# Patient Record
Sex: Female | Born: 1960
Health system: Southern US, Community
[De-identification: ages and names within clinical notes are randomized; demographics above are authoritative.]

## PROBLEM LIST (undated history)

## (undated) DIAGNOSIS — K449 Diaphragmatic hernia without obstruction or gangrene: Secondary | ICD-10-CM

## (undated) DIAGNOSIS — R112 Nausea with vomiting, unspecified: Secondary | ICD-10-CM

## (undated) DIAGNOSIS — D3610 Benign neoplasm of peripheral nerves and autonomic nervous system, unspecified: Secondary | ICD-10-CM

## (undated) DIAGNOSIS — M1990 Unspecified osteoarthritis, unspecified site: Secondary | ICD-10-CM

## (undated) DIAGNOSIS — I493 Ventricular premature depolarization: Secondary | ICD-10-CM

## (undated) DIAGNOSIS — E039 Hypothyroidism, unspecified: Secondary | ICD-10-CM

## (undated) DIAGNOSIS — Z9889 Other specified postprocedural states: Secondary | ICD-10-CM

## (undated) DIAGNOSIS — K219 Gastro-esophageal reflux disease without esophagitis: Secondary | ICD-10-CM

## (undated) DIAGNOSIS — F32A Depression, unspecified: Secondary | ICD-10-CM

## (undated) DIAGNOSIS — E7800 Pure hypercholesterolemia, unspecified: Secondary | ICD-10-CM

## (undated) DIAGNOSIS — R079 Chest pain, unspecified: Secondary | ICD-10-CM

## (undated) DIAGNOSIS — Z8639 Personal history of other endocrine, nutritional and metabolic disease: Secondary | ICD-10-CM

## (undated) DIAGNOSIS — R002 Palpitations: Secondary | ICD-10-CM

## (undated) DIAGNOSIS — K589 Irritable bowel syndrome without diarrhea: Secondary | ICD-10-CM

## (undated) DIAGNOSIS — J189 Pneumonia, unspecified organism: Secondary | ICD-10-CM

## (undated) DIAGNOSIS — R51 Headache: Secondary | ICD-10-CM

## (undated) DIAGNOSIS — F329 Major depressive disorder, single episode, unspecified: Secondary | ICD-10-CM

## (undated) HISTORY — DX: Palpitations: R00.2

## (undated) HISTORY — DX: Morbid (severe) obesity due to excess calories: E66.01

## (undated) HISTORY — DX: Irritable bowel syndrome, unspecified: K58.9

## (undated) HISTORY — PX: BIOPSY THYROID: PRO38

## (undated) HISTORY — DX: Gastro-esophageal reflux disease without esophagitis: K21.9

## (undated) HISTORY — DX: Ventricular premature depolarization: I49.3

## (undated) HISTORY — PX: ABDOMINAL HYSTERECTOMY: SHX81

## (undated) HISTORY — DX: Chest pain, unspecified: R07.9

## (undated) HISTORY — PX: CHOLECYSTECTOMY: SHX55

## (undated) HISTORY — DX: Diaphragmatic hernia without obstruction or gangrene: K44.9

## (undated) HISTORY — PX: SPINE SURGERY: SHX786

## (undated) HISTORY — DX: Unspecified osteoarthritis, unspecified site: M19.90

---

## 1999-09-19 ENCOUNTER — Other Ambulatory Visit: Admission: RE | Admit: 1999-09-19 | Discharge: 1999-09-19 | Payer: Self-pay | Admitting: Obstetrics and Gynecology

## 2000-01-29 ENCOUNTER — Inpatient Hospital Stay (HOSPITAL_COMMUNITY): Admission: RE | Admit: 2000-01-29 | Discharge: 2000-02-02 | Payer: Self-pay | Admitting: Obstetrics and Gynecology

## 2000-01-29 ENCOUNTER — Encounter (INDEPENDENT_AMBULATORY_CARE_PROVIDER_SITE_OTHER): Payer: Self-pay | Admitting: Specialist

## 2000-02-11 ENCOUNTER — Inpatient Hospital Stay (HOSPITAL_COMMUNITY): Admission: AD | Admit: 2000-02-11 | Discharge: 2000-02-11 | Payer: Self-pay | Admitting: Obstetrics and Gynecology

## 2002-01-29 ENCOUNTER — Encounter: Admission: RE | Admit: 2002-01-29 | Discharge: 2002-01-29 | Payer: Self-pay | Admitting: Obstetrics and Gynecology

## 2002-01-29 ENCOUNTER — Encounter: Payer: Self-pay | Admitting: Obstetrics and Gynecology

## 2002-01-31 ENCOUNTER — Ambulatory Visit (HOSPITAL_COMMUNITY): Admission: RE | Admit: 2002-01-31 | Discharge: 2002-01-31 | Payer: Self-pay | Admitting: Internal Medicine

## 2002-01-31 ENCOUNTER — Encounter: Payer: Self-pay | Admitting: Internal Medicine

## 2002-11-22 ENCOUNTER — Encounter: Payer: Self-pay | Admitting: Obstetrics and Gynecology

## 2002-11-22 ENCOUNTER — Encounter: Admission: RE | Admit: 2002-11-22 | Discharge: 2002-11-22 | Payer: Self-pay | Admitting: Obstetrics and Gynecology

## 2002-12-22 ENCOUNTER — Other Ambulatory Visit: Admission: RE | Admit: 2002-12-22 | Discharge: 2002-12-22 | Payer: Self-pay | Admitting: Obstetrics and Gynecology

## 2004-05-03 ENCOUNTER — Encounter: Admission: RE | Admit: 2004-05-03 | Discharge: 2004-05-03 | Payer: Self-pay | Admitting: Obstetrics and Gynecology

## 2004-12-28 ENCOUNTER — Ambulatory Visit: Payer: Self-pay | Admitting: Orthopedic Surgery

## 2005-04-10 ENCOUNTER — Ambulatory Visit: Payer: Self-pay | Admitting: Orthopedic Surgery

## 2005-04-16 ENCOUNTER — Encounter: Payer: Self-pay | Admitting: Orthopedic Surgery

## 2005-12-12 ENCOUNTER — Encounter: Admission: RE | Admit: 2005-12-12 | Discharge: 2005-12-12 | Payer: Self-pay | Admitting: Obstetrics and Gynecology

## 2006-03-03 ENCOUNTER — Encounter: Payer: Self-pay | Admitting: Internal Medicine

## 2006-08-04 ENCOUNTER — Ambulatory Visit: Payer: Self-pay | Admitting: Internal Medicine

## 2006-08-04 ENCOUNTER — Encounter: Payer: Self-pay | Admitting: Internal Medicine

## 2006-08-11 ENCOUNTER — Ambulatory Visit: Payer: Self-pay | Admitting: Internal Medicine

## 2007-01-09 DIAGNOSIS — D649 Anemia, unspecified: Secondary | ICD-10-CM | POA: Insufficient documentation

## 2007-01-09 DIAGNOSIS — Z860100 Personal history of colon polyps, unspecified: Secondary | ICD-10-CM | POA: Insufficient documentation

## 2007-01-09 DIAGNOSIS — F329 Major depressive disorder, single episode, unspecified: Secondary | ICD-10-CM

## 2007-01-09 DIAGNOSIS — E119 Type 2 diabetes mellitus without complications: Secondary | ICD-10-CM

## 2007-01-16 ENCOUNTER — Telehealth: Payer: Self-pay | Admitting: Internal Medicine

## 2007-01-23 ENCOUNTER — Telehealth: Payer: Self-pay | Admitting: Internal Medicine

## 2007-01-30 ENCOUNTER — Ambulatory Visit: Payer: Self-pay | Admitting: Internal Medicine

## 2007-01-30 DIAGNOSIS — F17200 Nicotine dependence, unspecified, uncomplicated: Secondary | ICD-10-CM | POA: Insufficient documentation

## 2007-01-30 DIAGNOSIS — E669 Obesity, unspecified: Secondary | ICD-10-CM

## 2007-01-30 DIAGNOSIS — R7309 Other abnormal glucose: Secondary | ICD-10-CM

## 2007-01-30 DIAGNOSIS — G479 Sleep disorder, unspecified: Secondary | ICD-10-CM | POA: Insufficient documentation

## 2007-01-30 DIAGNOSIS — E782 Mixed hyperlipidemia: Secondary | ICD-10-CM

## 2007-01-30 DIAGNOSIS — E0500 Thyrotoxicosis with diffuse goiter without thyrotoxic crisis or storm: Secondary | ICD-10-CM | POA: Insufficient documentation

## 2007-02-11 ENCOUNTER — Telehealth (INDEPENDENT_AMBULATORY_CARE_PROVIDER_SITE_OTHER): Payer: Self-pay | Admitting: *Deleted

## 2007-02-23 ENCOUNTER — Telehealth: Payer: Self-pay | Admitting: Internal Medicine

## 2007-04-20 ENCOUNTER — Ambulatory Visit: Payer: Self-pay | Admitting: Internal Medicine

## 2007-06-26 ENCOUNTER — Ambulatory Visit: Payer: Self-pay | Admitting: Internal Medicine

## 2008-02-09 ENCOUNTER — Ambulatory Visit: Payer: Self-pay | Admitting: Internal Medicine

## 2008-10-05 ENCOUNTER — Ambulatory Visit: Payer: Self-pay | Admitting: Cardiovascular Disease

## 2008-12-27 ENCOUNTER — Ambulatory Visit: Payer: Self-pay | Admitting: Unknown Physician Specialty

## 2009-02-10 ENCOUNTER — Ambulatory Visit: Payer: Self-pay

## 2009-02-10 ENCOUNTER — Other Ambulatory Visit: Payer: Self-pay

## 2009-03-13 ENCOUNTER — Ambulatory Visit: Payer: Self-pay | Admitting: Orthopedic Surgery

## 2009-05-02 ENCOUNTER — Ambulatory Visit: Payer: Self-pay | Admitting: Unknown Physician Specialty

## 2009-08-09 ENCOUNTER — Ambulatory Visit: Payer: Self-pay | Admitting: Orthopedic Surgery

## 2009-08-30 ENCOUNTER — Other Ambulatory Visit: Payer: Self-pay

## 2009-12-19 ENCOUNTER — Encounter: Payer: Self-pay | Admitting: Internal Medicine

## 2009-12-19 ENCOUNTER — Inpatient Hospital Stay (HOSPITAL_COMMUNITY): Admission: RE | Admit: 2009-12-19 | Discharge: 2009-12-22 | Payer: Self-pay | Admitting: Neurosurgery

## 2010-02-14 ENCOUNTER — Other Ambulatory Visit: Payer: Self-pay

## 2010-04-18 ENCOUNTER — Ambulatory Visit: Payer: Self-pay

## 2010-05-29 NOTE — Op Note (Signed)
Summary: Lumbar Fusion/Dr. Maeola Harman  Lumbar Fusion/Dr. Maeola Harman   Imported By: Maryln Gottron 12/28/2009 14:45:44  _____________________________________________________________________  External Attachment:    Type:   Image     Comment:   External Document

## 2010-07-13 LAB — URINE CULTURE
Colony Count: NO GROWTH
Culture  Setup Time: 201108231918
Culture: NO GROWTH

## 2010-07-13 LAB — CBC
MCH: 29.5 pg (ref 26.0–34.0)
Platelets: 244 10*3/uL (ref 150–400)
RBC: 4.88 MIL/uL (ref 3.87–5.11)
RDW: 13.1 % (ref 11.5–15.5)
WBC: 8.5 10*3/uL (ref 4.0–10.5)

## 2010-07-13 LAB — URINALYSIS, MICROSCOPIC ONLY
Glucose, UA: NEGATIVE mg/dL
Hgb urine dipstick: NEGATIVE
Ketones, ur: NEGATIVE mg/dL
Leukocytes, UA: NEGATIVE
Nitrite: NEGATIVE
Protein, ur: NEGATIVE mg/dL

## 2010-07-13 LAB — BASIC METABOLIC PANEL
Creatinine, Ser: 0.82 mg/dL (ref 0.4–1.2)
GFR calc Af Amer: >60 mL/min (ref >60)

## 2010-07-13 LAB — SURGICAL PCR SCREEN
MRSA, PCR: NEGATIVE
Staphylococcus aureus: NEGATIVE

## 2010-07-13 LAB — TYPE AND SCREEN

## 2010-09-14 NOTE — H&P (Signed)
[facility]  Patient:    Tasha, Pearson                           MRN: [id number] Attending:  Rande Brunt. Eda Paschal, M.D.                         History and Physical  This was dictated on the day of her admission and it could not be found by medical records and so therefore, it is being redictated.  CHIEF COMPLAINT:  Dysmenorrhea and menorrhagia.  HISTORY OF PRESENT ILLNESS:  Patient is a 50 year old gravida 2, para 2, who has had persistent and progressive dysmenorrhea, menorrhagia and pain on her right side during her periods and also two weeks after her periods.  They have become more extreme over the past year or so.  She was living in [place].  She underwent a diagnostic laparoscopy and ultrasound there which confirmed fibroids; she also underwent a D&C for the menorrhagia without any improvement in the above.  In addition, she notices between her periods a mass in her right lower quadrant that gets bigger and then gets smaller again and it was not seen at the time of laparoscopy but clearly, it also gives her a lot of pain and discomfort and clearly could be an area of endometriosis.  She has had two previous cesarean sections, one in January of 1996 and one in November of 1997.  She is a smoker and therefore is not a candidate for oral contraceptives.  As a result of all of the above, she enters the hospital for a total abdominal hysterectomy and exploration of the lower quadrant because of this intermittent pain.  We have decided to conserve her ovaries unless there is severe disease, because of her age; she therefore will not undergo bilateral salpingo-oophorectomy.  PAST MEDICAL HISTORY:  Patient has some depression and is currently taking Zoloft 50 mg daily.  She also has a history of Graves disease and is status post treatment and currently not on any medicine for this.  PREVIOUS SURGERY:  Arthroscopy in 1990.  Cholecystectomy in 1996.   C-sections in 1996 and 1997.  Laparoscopy and D&C in 2000.  FAMILY HISTORY:  Father has had an MI, age 29.  Mother died of pancreatic cancer.  She has a great-aunt with colon cancer.  She also has a family history of diabetes.  SOCIAL HISTORY:  She is a smoker, smoking one pack per week.  She does not use alcohol.  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  PHYSICAL EXAMINATION  GENERAL:  Patient is a well-developed, well-nourished female in no acute distress.  VITAL SIGNS:  Blood pressure is 112/74, pulse is 80 and regular, respirations are 16 and nonlabored.  She is afebrile.  HEENT:  All within normal limits.  NECK:  Supple.  Trachea in the midline.  Thyroid not enlarged.  LUNGS:  Clear to P&A.  HEART:  No thrills, heaves or murmurs.  BREASTS:  No masses.  ABDOMEN:  Soft, without guarding, rebound or masses.  PELVIC:  External and vaginal are within normal limits.  Cervix is clean.  Pap smear shows no atypia.  Uterus is enlarged by myomas to 10-week-size.  Adnexa are palpably normal.  RECTAL:  Negative.  EXTREMITIES:  Within normal limits.  ADMISSION IMPRESSION:  Severe dysmenorrhea, persistent pelvic pain, intermittent right lower quadrant mass; endometriosis and/or adenomyosis suspected along with  fibroids.  PLAN:  Exploratory laparotomy with total abdominal hysterectomy and any other appropriate surgery. DD:  02/19/00 TD:  02/19/00 Job: [id number]

## 2010-09-14 NOTE — Op Note (Signed)
[facility]  Patient:    Tasha Pearson, Tasha Pearson                    MRN: [id number] Proc. Date: 03/04/00 Adm. Date:  [id number] Disc. Date: [id number] Attending:  Sharon Mt                           Operative Report  NO DICTATION. DD:  03/04/00 TD:  03/04/00 Job: [id number]

## 2010-09-14 NOTE — Discharge Summary (Signed)
[facility]  Patient:    Tasha Pearson, Tasha Pearson                    MRN: [id number] Adm. Date:  [id number] Disc. Date: [id number] Attending:  Sharon Mt                           Discharge Summary  HISTORY OF PRESENT ILLNESS:  The patient is a 50 year old female who was admitted to the hospital with a history of severe dysmenorrhea, menorrhagia, endometriosis suspected, and a lump in her right lower quadrant which got bit each month on a cyclical basis.  She entered the hospital for definitive surgery.  HOSPITAL COURSE:  On the day of admission she was taken to the operating room, and a total abdominal hysterectomy was performed.  Also found at the same time was a subcutaneous nodule which was dissected free and which came back endometriosis on frozen section.  Postoperatively, she seemed to be doing well except she started to develop nausea and inability to tolerate an oral diet. She was continued on IV fluids and restricted diet.  She also ran a fever up to 101 which was observed.  Finally, by the fourth postoperative day she was no longer nauseated.  She was tolerating an oral diet well and was ready for discharge.  DISCHARGE MEDICATIONS:  Mepergan Fortis 1 or 2 every four hours as needed for pain.  ACTIVITY:  Ambulatory.  WOUND CARE:  Routine.  Her staples had already been removed.  FOLLOW-UP:  She was to return to the office in three weeks for further follow-up.  CONDITION ON DISCHARGE:  Improved.  PATHOLOGY:  Final pathology report cannot be found in the chart at the moment.  DISCHARGE DIAGNOSES: 1. Dysmenorrhea. 2. Fibroids. 3. Endometriosis with subcutaneous endometriosis.  OPERATIONS:  Total abdominal hysterectomy and excision of subcutaneous endometriosis. DD:  02/18/00 TD:  02/19/00 Job: [id number]

## 2010-09-14 NOTE — Op Note (Signed)
[facility]  Patient:    Tasha Pearson, Tasha Pearson                    MRN: [id number] Proc. Date: 03/04/00 Adm. Date:  [id number] Disc. Date: [id number] Attending:  Sharon Mt                           Operative Report  REDICTATION  This was dictated on January 29, 2000, but was lost by medical records, and therefore is being redictated.  It is dictated as accurately as possible, but it has been 6-7 weeks since the surgery.  PREOPERATIVE DIAGNOSIS:  Severe dysmenorrhea, persistent pelvic pain, intermittent right lower quadrant mass, endometriosis, and adenomyosis suspected along with leiomyomata uteri.  POSTOPERATIVE DIAGNOSIS:  Dysmenorrhea, menorrhagia, pelvic pain, subcutaneous endometriosis, endometriosis of the serosa of the uterus, leiomyomata uteri.  OPERATION:  Total abdominal hysterectomy, excision of endometriosis, excision of endometriotic nodule in subcutaneous tissue.  SURGEON:  Daniel L. Eda Paschal, M.D.  FINDINGS:  At the time of laparotomy in the subcutaneous tissue above the fascia, a mass was found.  Frozen section was obtained which showed endometriosis on frozen section.  The uterus was enlarged by multiple myoma to approximately 12 weeks size.  There was also some serosal endometriosis present.  Ovaries and fallopian tubes were free of any disease.  DESCRIPTION OF PROCEDURE:  After adequate general endotracheal anesthesia, the patient was placed in the supine position, prepped and draped in the usual sterile manner.  A Foley catheter was inserted into the patients bladder.  A Pfannenstiel incision was made.  Dissection was carried down through the subcutaneous tissue, and on the right side, where the patient had been complaining of intermittent right lower quadrant pain, a mass was found.  It was sharply dissected free from the surrounding subcutaneous tissue.  The entire mass was removed without leaving any of it behind.   Frozen section was obtained and it was endometriosis.  The fascia was then opened transversely. The peritoneum was entered vertically.  Subcutaneous bleeders were clamped and Bovied as encountered.  When the peritoneal cavity was open, multiple leiomyomata uteri were found as well as some serosal endometriosis.  However, ovaries and fallopian tubes, and pelvic peritoneum were otherwise free of disease.  The round ligaments were clamped, cut, and suture ligated with #1 chromic catgut.  The vesicouterine fold and the peritoneum were sharply dissected free.  The utero-ovarian, round ligaments, and fallopian tubes were clamped, cut, and doubly suture ligated with #1 chromic catgut, leaving the ovaries and tubes in place.  The posterior peritoneum was sharply dissected free, pushing the ureters away from the uterus.  The uterine arteries were clamped, cut, and doubly suture ligated with #1 chromic catgut.  The parametrium was taken down in successive bites by clamping, cutting, and suture ligating with #1 chromic catgut.  The cervicovaginal junction was identified and entered with sharp dissection, and the uterus was sent to pathology for tissue diagnosis along with the subcutaneous nodule which had already gone and was identified as endometriosis.  Copious irrigation was done with Ringers lactate.  The vaginal cuff was sutured with #1 chromic catgut incorporating uterosacrals and parametrium for good vault support.  The cuff was closed with interrupted 0 Vicryl.  Two sponge, needle, and instrument counts were correct.  The peritoneum was closed with a running 0 Vicryl.  Subfascial and ________ spaces were copiously irrigated with Ringers lactate. The fascia was closed  with two running 0 Vicryl and the skin was closed with staples.  Estimated blood loss for the entire procedure was 100 cc with none replaced.  The patient tolerated the procedure well and left the operating room in satisfactory  condition. DD:  03/04/00 TD:  03/04/00 Job: [id number]

## 2011-06-17 ENCOUNTER — Other Ambulatory Visit (HOSPITAL_COMMUNITY): Payer: Self-pay | Admitting: Orthopedic Surgery

## 2011-06-17 DIAGNOSIS — M25561 Pain in right knee: Secondary | ICD-10-CM

## 2011-06-20 ENCOUNTER — Ambulatory Visit (HOSPITAL_COMMUNITY)
Admission: RE | Admit: 2011-06-20 | Discharge: 2011-06-20 | Disposition: A | Discharge status: Home / Self Care | Payer: 59 | Source: Ambulatory Visit | Attending: Orthopedic Surgery | Admitting: Orthopedic Surgery

## 2011-06-20 DIAGNOSIS — M25561 Pain in right knee: Secondary | ICD-10-CM

## 2011-06-20 DIAGNOSIS — M7120 Synovial cyst of popliteal space [Baker], unspecified knee: Secondary | ICD-10-CM | POA: Insufficient documentation

## 2011-06-20 DIAGNOSIS — IMO0002 Reserved for concepts with insufficient information to code with codable children: Secondary | ICD-10-CM | POA: Insufficient documentation

## 2011-06-20 DIAGNOSIS — M25569 Pain in unspecified knee: Secondary | ICD-10-CM | POA: Insufficient documentation

## 2011-06-20 DIAGNOSIS — M1710 Unilateral primary osteoarthritis, unspecified knee: Secondary | ICD-10-CM | POA: Insufficient documentation

## 2011-06-20 DIAGNOSIS — M76899 Other specified enthesopathies of unspecified lower limb, excluding foot: Secondary | ICD-10-CM | POA: Insufficient documentation

## 2011-06-20 DIAGNOSIS — M25469 Effusion, unspecified knee: Secondary | ICD-10-CM | POA: Insufficient documentation

## 2012-12-22 ENCOUNTER — Ambulatory Visit: Payer: 59 | Admitting: Family Medicine

## 2013-01-01 ENCOUNTER — Encounter (INDEPENDENT_AMBULATORY_CARE_PROVIDER_SITE_OTHER): Payer: Self-pay | Admitting: General Surgery

## 2013-01-01 ENCOUNTER — Ambulatory Visit (INDEPENDENT_AMBULATORY_CARE_PROVIDER_SITE_OTHER): Payer: Commercial Managed Care - PPO | Admitting: General Surgery

## 2013-01-01 DIAGNOSIS — R739 Hyperglycemia, unspecified: Secondary | ICD-10-CM

## 2013-01-01 DIAGNOSIS — F329 Major depressive disorder, single episode, unspecified: Secondary | ICD-10-CM

## 2013-01-01 DIAGNOSIS — E785 Hyperlipidemia, unspecified: Secondary | ICD-10-CM

## 2013-01-01 DIAGNOSIS — E119 Type 2 diabetes mellitus without complications: Secondary | ICD-10-CM

## 2013-01-01 DIAGNOSIS — E0500 Thyrotoxicosis with diffuse goiter without thyrotoxic crisis or storm: Secondary | ICD-10-CM

## 2013-01-01 DIAGNOSIS — R7309 Other abnormal glucose: Secondary | ICD-10-CM

## 2013-01-01 NOTE — Progress Notes (Signed)
Subjective:   Morbid obesity  Patient ID: Tasha Pearson, female   DOB: 1961/02/27, 52 y.o.   MRN: [id number]  HPI Tasha Pearson y.o.female presents for consideration for surgical treatment for morbid obesity.  she  gives a history of progressive obesity since teenage years despite multiple attempts at medical management. She has tried many diet and exercise plans and medications. She has often had temporary marked success losing over 100 pounds at a time but then she will plateau and experienced progressive weight regain. She states she is able to lose weight quite easily but cannot keep it off.  her weight has been affecting her in a number of ways including Foot pain including plantar fasciitis. She is concerned about her long-term health on forward and her current weight.  she has been to our initial information seminar, researched surgical options thoroughly and is interested in Lap band surgery at the time of her presentation due to the less invasive nature of the surgery and the safety profile.  Past Medical History  Diagnosis Date  . Thyroid disease   . Arthritis    Past Surgical History  Procedure Laterality Date  . Cesarean section    . Cholecystectomy    . Abdominal hysterectomy    . Spine surgery     Current Outpatient Prescriptions  Medication Sig Dispense Refill  . acetaminophen (TYLENOL) 500 MG tablet Take 500 mg by mouth every 6 (six) hours as needed for pain.      Marland Kitchen ibuprofen (ADVIL,MOTRIN) 200 MG tablet Take 200 mg by mouth every 6 (six) hours as needed for pain.      Marland Kitchen levothyroxine (SYNTHROID, LEVOTHROID) 125 MCG tablet Take 125 mcg by mouth daily before breakfast.      . traMADol (ULTRAM) 50 MG tablet Take 50 mg by mouth every 6 (six) hours as needed for pain.       No current facility-administered medications for this visit.   Allergies  Allergen Reactions  . Penicillins    History  Substance Use Topics  . Smoking status: Former Games developer  . Smokeless tobacco:  Former Neurosurgeon    Quit date: 01/01/2009  . Alcohol Use: Yes    Review of Systems  Constitutional: Negative.   HENT: Negative.   Respiratory: Negative.   Cardiovascular: Negative.   Gastrointestinal: Positive for diarrhea and constipation. Negative for nausea, vomiting, abdominal pain, blood in stool, abdominal distention and anal bleeding.  Endocrine:       Hypothyroid post treatment for Graves' disease  Genitourinary: Negative.   Musculoskeletal: Positive for back pain and arthralgias.  Neurological: Negative.        Objective:   Physical Exam BP 144/94  Pulse 76  Temp(Src) 97 F (36.1 C)  Resp 18  Ht 5\' 5"  (1.651 m)  Wt 281 lb 2 oz (127.517 kg)  BMI 46.78 kg/m2 General: Alert, Morbidly obese Caucasian female, in no distress Skin: Warm and dry without rash or infection. HEENT: No palpable masses or thyromegaly. Sclera nonicteric. Pupils equal round and reactive. Oropharynx clear. Lymph nodes: No cervical, supraclavicular, or inguinal nodes palpable. Lungs: Breath sounds clear and equal without increased work of breathing Cardiovascular: Regular rate and rhythm without murmur. No JVD or edema. Peripheral pulses intact. Abdomen: Nondistended. Soft and nontender. No masses palpable. No organomegaly. No palpable hernias. Extremities: No edema or joint swelling or deformity. No chronic venous stasis changes. Neurologic: Alert and fully oriented. Gait normal.    Assessment:     Patient with progressive  morbid obesity unresponsive to multiple efforts at medical management who presents with a BMI of 46.7 and comorbidities of Joint and foot pain.. I believe there would be very significant medical benefit from surgical weight loss. After our discussion of surgical options currently available the patient has decided to proceed with LAP-BAND placement due to the reasons above. She feels that any stapled procedure would be much too invasive for her. We discussed that some higher BMI  patients have difficulty with weight loss with LAP-BAND but she has had temporary very good success on her own so this may not be as much of an issue in her case. We have discussed the nature of morbid obesity and the risk of remaining obese. We discussed the indications for the procedure, its nature, and expected recovery. The risks of the procedure were discussed in detail including anesthetic complications, bleeding, infection, visceral injury, dysphagia, and long-term risks of mechanical failure, slippage, erosion, esophageal dilatation and failure to lose weight. Rare risk of death was discussed. The patient was given a complete consent form and all questions were answered. We will proceed with preoperative workup including routine lab and x-rays, psychologic and nutrition evaluations, and upper GI series.  I will see the patient back following this evaluation.        Plan:     As above

## 2013-01-02 LAB — CBC WITH DIFFERENTIAL/PLATELET
Basophils Absolute: 0 10*3/uL (ref 0.0–0.1)
Basophils Relative: 1 % (ref 0–1)
Eosinophils Absolute: 0.2 10*3/uL (ref 0.0–0.7)
Lymphocytes Relative: 34 % (ref 12–46)
Lymphs Abs: 2.3 10*3/uL (ref 0.7–4.0)
MCHC: 34 g/dL (ref 30.0–36.0)
WBC: 6.5 10*3/uL (ref 4.0–10.5)

## 2013-01-02 LAB — COMPREHENSIVE METABOLIC PANEL
ALT: 12 U/L (ref 0–35)
AST: 12 U/L (ref 0–37)
BUN: 18 mg/dL (ref 6–23)
CO2: 30 mEq/L (ref 19–32)
Creat: 0.75 mg/dL (ref 0.50–1.10)
Glucose, Bld: 91 mg/dL (ref 70–99)
Total Protein: 6.8 g/dL (ref 6.0–8.3)

## 2013-01-02 LAB — T4: T4, Total: 11.1 ug/dL (ref 5.0–12.5)

## 2013-01-02 LAB — LIPID PANEL
Cholesterol: 224 mg/dL — ABNORMAL HIGH (ref 0–200)
HDL: 57 mg/dL (ref >39)
Total CHOL/HDL Ratio: 3.9 Ratio
VLDL: 22 mg/dL (ref 0–40)

## 2013-01-02 LAB — TSH: TSH: 2.952 u[IU]/mL (ref 0.350–4.500)

## 2013-01-18 ENCOUNTER — Encounter: Payer: 59 | Attending: General Surgery | Admitting: *Deleted

## 2013-01-18 ENCOUNTER — Encounter: Payer: Self-pay | Admitting: *Deleted

## 2013-01-18 ENCOUNTER — Ambulatory Visit: Payer: 59 | Admitting: *Deleted

## 2013-01-18 DIAGNOSIS — Z713 Dietary counseling and surveillance: Secondary | ICD-10-CM | POA: Insufficient documentation

## 2013-01-18 NOTE — Patient Instructions (Addendum)
   Follow Pre-Op Nutrition Goals to prepare for Lapband Surgery.   Call the [facility] at [phone] once you have been given your surgery date to enrolled in the Pre-Op Nutrition Class. You will need to attend this nutrition class 3-4 weeks prior to your surgery. 

## 2013-01-18 NOTE — Progress Notes (Signed)
  Pre-Op Assessment Visit:  Pre-Operative LAGB Surgery  Medical Nutrition Therapy:  Appt start time: 1100   End time:  1120.  Patient was seen on 01/18/2013 for Pre-Operative LAGB Nutrition Assessment. Assessment and letter of approval faxed to [facility] Bariatric [facility] Program coordinator on 01/19/13.   Handouts given during visit include:  Pre-Op Goals Bariatric Surgery Protein Shakes  Patient to call the [facility] to enroll in Pre-Op and Post-Op Nutrition Education when surgery date is scheduled.

## 2013-01-19 ENCOUNTER — Encounter: Payer: Self-pay | Admitting: *Deleted

## 2013-01-19 ENCOUNTER — Other Ambulatory Visit: Payer: Self-pay

## 2013-01-19 ENCOUNTER — Ambulatory Visit (HOSPITAL_COMMUNITY)
Admission: RE | Admit: 2013-01-19 | Discharge: 2013-01-19 | Disposition: A | Discharge status: Home / Self Care | Payer: 59 | Source: Ambulatory Visit | Attending: General Surgery | Admitting: General Surgery

## 2013-01-19 DIAGNOSIS — E785 Hyperlipidemia, unspecified: Secondary | ICD-10-CM

## 2013-01-19 DIAGNOSIS — Z9089 Acquired absence of other organs: Secondary | ICD-10-CM | POA: Insufficient documentation

## 2013-01-19 DIAGNOSIS — K219 Gastro-esophageal reflux disease without esophagitis: Secondary | ICD-10-CM | POA: Insufficient documentation

## 2013-01-19 DIAGNOSIS — E0500 Thyrotoxicosis with diffuse goiter without thyrotoxic crisis or storm: Secondary | ICD-10-CM | POA: Insufficient documentation

## 2013-01-19 DIAGNOSIS — R739 Hyperglycemia, unspecified: Secondary | ICD-10-CM

## 2013-01-19 DIAGNOSIS — F329 Major depressive disorder, single episode, unspecified: Secondary | ICD-10-CM

## 2013-01-19 DIAGNOSIS — M79609 Pain in unspecified limb: Secondary | ICD-10-CM | POA: Insufficient documentation

## 2013-01-19 DIAGNOSIS — K449 Diaphragmatic hernia without obstruction or gangrene: Secondary | ICD-10-CM | POA: Insufficient documentation

## 2013-01-19 DIAGNOSIS — E119 Type 2 diabetes mellitus without complications: Secondary | ICD-10-CM

## 2013-01-19 DIAGNOSIS — K224 Dyskinesia of esophagus: Secondary | ICD-10-CM | POA: Insufficient documentation

## 2013-01-19 DIAGNOSIS — Z981 Arthrodesis status: Secondary | ICD-10-CM | POA: Insufficient documentation

## 2013-01-19 DIAGNOSIS — Z6841 Body Mass Index (BMI) 40.0 and over, adult: Secondary | ICD-10-CM | POA: Insufficient documentation

## 2013-01-21 ENCOUNTER — Ambulatory Visit: Payer: 59 | Admitting: *Deleted

## 2013-01-26 ENCOUNTER — Encounter (INDEPENDENT_AMBULATORY_CARE_PROVIDER_SITE_OTHER): Payer: Self-pay

## 2013-02-05 ENCOUNTER — Ambulatory Visit (INDEPENDENT_AMBULATORY_CARE_PROVIDER_SITE_OTHER): Payer: 59 | Admitting: Family Medicine

## 2013-02-05 ENCOUNTER — Encounter: Payer: Self-pay | Admitting: *Deleted

## 2013-02-05 ENCOUNTER — Encounter: Payer: Self-pay | Admitting: Family Medicine

## 2013-02-05 VITALS — BP 140/83 | HR 76 | Resp 16 | Wt 286.0 lb

## 2013-02-05 DIAGNOSIS — E039 Hypothyroidism, unspecified: Secondary | ICD-10-CM

## 2013-02-05 DIAGNOSIS — M79672 Pain in left foot: Secondary | ICD-10-CM

## 2013-02-05 DIAGNOSIS — M79671 Pain in right foot: Secondary | ICD-10-CM

## 2013-02-05 DIAGNOSIS — F3289 Other specified depressive episodes: Secondary | ICD-10-CM

## 2013-02-05 DIAGNOSIS — F329 Major depressive disorder, single episode, unspecified: Secondary | ICD-10-CM

## 2013-02-05 DIAGNOSIS — M79609 Pain in unspecified limb: Secondary | ICD-10-CM

## 2013-02-05 MED ORDER — BUPROPION HCL ER (XL) 150 MG PO TB24: 150.0000 mg | ORAL_TABLET | ORAL | Status: DC

## 2013-02-05 MED ORDER — FLUOXETINE HCL 20 MG PO CAPS: 20.0000 mg | ORAL_CAPSULE | Freq: Every day | ORAL | Status: DC

## 2013-02-05 MED ORDER — LEVOTHYROXINE SODIUM 125 MCG PO TABS: 125.0000 ug | ORAL_TABLET | Freq: Every day | ORAL | Status: DC

## 2013-02-05 NOTE — Progress Notes (Signed)
Subjective:    Patient ID: Tasha Pearson, female    DOB: December 13, 1960, 52 y.o.   MRN: [id number]  HPI  [name] is here today to discuss the condition listed below:    1)  Bilateral Feet Pain:  She has struggled with this problem for years.  She has been taking OTC pain medications which have helped her some.  She would like to get a prescription for orthotics for her shoes.   2)  Obesity:  She is considering the lap band procedure.  She has started the pre-op requisites with [facility].  They are requesting a recommendation letter from her PCP.    3)  Hypothyroidism:  She is doing well on her current dose of levothyroxine.  She recently had pre-op labwork done.  She needs a refill on it.     Review of Systems  Constitutional: Positive for unexpected weight change.  HENT: Negative.   Eyes: Negative.   Respiratory: Negative.   Cardiovascular: Negative.   Gastrointestinal: Negative.   Endocrine: Negative.   Genitourinary: Negative.   Allergic/Immunologic: Negative.   Neurological: Negative.   Hematological: Negative.   Psychiatric/Behavioral: Negative.      Past Medical History  Diagnosis Date  . Thyroid disease   . Arthritis   . Morbid obesity   . IBS (irritable bowel syndrome)     Pt reported on 01/18/13  . Hypertension     Pt reported 01/18/13     Family History  Problem Relation Age of Onset  . Cancer Mother   . 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 She quit smoking in 01/2010.     Alcohol Use:  None   Drug Use:  None   Diet:  Regular   Exercise:  None   Hobbies: Cooking/ Scrapbooking/ Gardening    Allergies  Allergen Reactions  . Penicillins      Current Outpatient Prescriptions on File Prior to Visit  Medication Sig Dispense Refill  . acetaminophen (TYLENOL) 500 MG tablet Take 500 mg by mouth every 6 (six) hours as needed for pain.      . traMADol (ULTRAM) 50 MG tablet Take 50 mg by mouth every 6 (six) hours as needed for pain.       No current facility-administered medications on file prior to visit.      Objective:   Physical Exam  Vitals reviewed. Constitutional: She is oriented to person, place, and time. She appears well-developed and well-nourished.  Cardiovascular: Normal rate and regular rhythm.   Pulmonary/Chest: Effort normal and breath sounds normal.  Neurological: She is alert and oriented to person, place, and time.  Skin: Skin is warm and dry.  Psychiatric: She has a normal mood and affect.      Assessment & Plan:    Eshaal was seen today for bariatric surgery recommendation letter and medication refill.  Diagnoses and associated orders for this visit:  Unspecified hypothyroidism - levothyroxine (SYNTHROID, LEVOTHROID) 125 MCG tablet; Take 1 tablet (125 mcg total) by mouth daily before breakfast.  Depressive disorder, not elsewhere classified - FLUoxetine (PROZAC) 20 MG capsule; Take 1 capsule (20 mg total)  by mouth daily. - buPROPion (WELLBUTRIN XL) 150 MG 24 hr tablet; Take 1 tablet (150 mg total) by mouth every morning.  Morbid obesity Comments: A letter was done in support of Kinzi getting bariatric surgery.  We discussed her having the gastric sleeve vs the lap band.  She is to discuss this further with Dr. Odie Sera.    Pain in both feet Comments: Recommended that she follow up with Dr. Ralene Cork.    TIME SPENT "FACE TO FACE" WITH PATIENT - 30 MINS

## 2013-02-11 ENCOUNTER — Ambulatory Visit: Payer: 59 | Admitting: Family Medicine

## 2013-02-22 ENCOUNTER — Encounter: Payer: Self-pay | Admitting: Family Medicine

## 2013-02-22 ENCOUNTER — Encounter: Payer: Self-pay | Admitting: *Deleted

## 2013-02-23 ENCOUNTER — Ambulatory Visit (INDEPENDENT_AMBULATORY_CARE_PROVIDER_SITE_OTHER): Payer: 59

## 2013-02-23 DIAGNOSIS — M898X9 Other specified disorders of bone, unspecified site: Secondary | ICD-10-CM

## 2013-02-23 DIAGNOSIS — M19071 Primary osteoarthritis, right ankle and foot: Secondary | ICD-10-CM

## 2013-02-23 DIAGNOSIS — M79609 Pain in unspecified limb: Secondary | ICD-10-CM

## 2013-02-23 DIAGNOSIS — M7750 Other enthesopathy of unspecified foot: Secondary | ICD-10-CM

## 2013-02-23 DIAGNOSIS — M19079 Primary osteoarthritis, unspecified ankle and foot: Secondary | ICD-10-CM

## 2013-02-23 MED ORDER — MELOXICAM 15 MG PO TABS: 15.0000 mg | ORAL_TABLET | Freq: Every day | ORAL | Status: DC

## 2013-02-23 NOTE — Progress Notes (Signed)
  Subjective:    Patient ID: Tasha Pearson, female    DOB: 06/17/1960, 52 y.o.   MRN: [id number] "I need new orthotics, my feet are killing me."  (Dorsal pain 4th/5th met. 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 Patient is also having pain Lisfranc for fifth metatarsal base bilateral. Patient has worn orthotics in the past with good success, however they are worn and he replaced  HPI Comments: N  Throbbing, aching, sometime cramping L  Pain 4/5 Met. Area b/l D  8 mos. O  Slow C  Gotten worse A  After doing a long case at work T  Tylenol and Advil/Motrin, sometime Ultram      Review of Systems  Constitutional: Negative.   HENT: Negative.   Eyes: Negative.   Respiratory: Negative.   Cardiovascular: Negative.   Gastrointestinal: Negative.   Endocrine: Negative.   Genitourinary: Negative.   Musculoskeletal: Positive for arthralgias.  Skin: Negative.   Allergic/Immunologic: Negative.   Neurological: Negative.   Hematological: Negative.   Psychiatric/Behavioral: Negative.        Objective:   Physical Exam  Vitals reviewed. Constitutional: She is oriented to person, place, and time. She appears well-developed and well-nourished.  Cardiovascular:  Pulses:      Dorsalis pedis pulses are 2+ on the right side, and 2+ on the left side.       Posterior tibial pulses are 2+ on the right side, and 2+ on the left side.  Capillary refill time 3 seconds all digits. Skin temperature warm turgor normal no edema noted no varicosities noted bilateral  Musculoskeletal:  Orthopedic biomechanical exam reveals rectus foot type bilateral x-rays reveal spurring Lisfranc joint bilateral lateral projections. Asymmetric joint space narrowing for a fifth metatarsal base and cuboid bilateral on oblique projections. Well-developed inferior retrocalcaneal spurring bilateral. Findings consistent with arthritic  changes midfoot and rear foot  Neurological: She is oriented to person, place, and time. She has normal reflexes.  Epicritic and proprioceptive sensations intact and symmetric bilateral. Normal plantar response and DTRs.  Skin: No cyanosis. Nails show no clubbing.  Skin color pigment normal hair growth absent bilateral nails normal  Psychiatric: Her behavior is normal.          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 Significant pain capsulitis Lisfranc for fifth metatarsal base and cuboid articulation bilateral. There is inferior retrocalcaneal spurring noted as well. Patient has pain both in gait ambulation and with enclosed shoe wear. The recommendation for more stable walking or athletic shoe at all times. Per patient request my recommendation scanning 4 08/[birth year]/1928 carried out at this time. Patient be followed next month when orthotics rate for fitting and dispensing. In the interim prescription for Mobic 15 mg once daily is dispensed discontinue ibuprofen. Patient scheduled for gastric banding or bypass type procedure will likely need discontinue NSAIDs in the future altogether. Advised her to discuss this with her primary physician and will of her arthropathy. Orthotics skin carried out this time recommended ice to the feet and maintain a stable shoe at all times next  [employer] DPM

## 2013-02-23 NOTE — Patient Instructions (Signed)
Osteoarthritis Osteoarthritis is the most common form of arthritis. It is redness, soreness, and swelling (inflammation) affecting the cartilage. Cartilage acts as a cushion, covering the ends of bones where they meet to form a joint. CAUSES  Over time, the cartilage begins to wear away. This causes bone to rub on bone. This produces pain and stiffness in the affected joints. Factors that contribute to this problem are:  Excessive body weight.  Age.  Overuse of joints. SYMPTOMS   People with osteoarthritis usually experience joint pain, swelling, or stiffness.  Over time, the joint may lose its normal shape.  Small deposits of bone (osteophytes) may grow on the edges of the joint.  Bits of bone or cartilage can break off and float inside the joint space. This may cause more pain and damage.  Osteoarthritis can lead to depression, anxiety, feelings of helplessness, and limitations on daily activities. The most commonly affected joints are in the:  Ends of the fingers.  Thumbs.  Neck.  Lower back.  Knees.  Hips. DIAGNOSIS  Diagnosis is mostly based on your symptoms and exam. Tests may be helpful, including:  X-rays of the affected joint.  A computerized magnetic scan (MRI).  Blood tests to rule out other types of arthritis.  Joint fluid tests. This involves using a needle to draw fluid from the joint and examining the fluid under a microscope. TREATMENT  Goals of treatment are to control pain, improve joint function, maintain a normal body weight, and maintain a healthy lifestyle. Treatment approaches may include:  A prescribed exercise program with rest and joint relief.  Weight control with nutritional education.  Pain relief techniques such as:  Properly applied heat and cold.  Electric pulses delivered to nerve endings under the skin (transcutaneous electrical nerve stimulation, TENS).  Massage.  Certain supplements. Ask your caregiver before using any  supplements, especially in combination with prescribed drugs.  Medicines to control pain, such as:  Acetaminophen.  Nonsteroidal anti-inflammatory drugs (NSAIDs), such as naproxen.  Narcotic or central-acting agents, such as tramadol. This drug carries a risk of addiction and is generally prescribed for short-term use.  Corticosteroids. These can be given orally or as injection. This is a short-term treatment, not recommended for routine use.  Surgery to reposition the bones and relieve pain (osteotomy) or to remove loose pieces of bone and cartilage. Joint replacement may be needed in advanced states of osteoarthritis. HOME CARE INSTRUCTIONS  Your caregiver can recommend specific types of exercise. These may include:  Strengthening exercises. These are done to strengthen the muscles that support joints affected by arthritis. They can be performed with weights or with exercise bands to add resistance.  Aerobic activities. These are exercises, such as brisk walking or low-impact aerobics, that get your heart pumping. They can help keep your lungs and circulatory system in shape.  Range-of-motion activities. These keep your joints limber.  Balance and agility exercises. These help you maintain daily living skills. Learning about your condition and being actively involved in your care will help improve the course of your osteoarthritis. SEEK MEDICAL CARE IF:   You feel hot or your skin turns red.  You develop a rash in addition to your joint pain.  You have an oral temperature above 102 F (38.9 C). FOR MORE INFORMATION  National Institute of Arthritis and Musculoskeletal and Skin Diseases: www.niams.nih.gov National Institute on Aging: www.nia.nih.gov American College of Rheumatology: www.rheumatology.org Document Released: 04/15/2005 Document Revised: 07/08/2011 Document Reviewed: 07/27/2009 Exit[employer] Patient Information 2014 [employer].   ICE INSTRUCTIONS  Apply ice or cold  pack to the affected area at least 3 times a day for 10-15 minutes each time.  You should also use ice after prolonged activity or vigorous exercise.  Do not apply ice longer than 20 minutes at one time.  Always keep a cloth between your skin and the ice pack to prevent burns.  Being consistent and following these instructions will help control your symptoms.  We suggest you purchase a gel ice pack because they are reusable and do bit leak.  Some of them are designed to wrap around the area.  Use the method that works best for you.  Here are some other suggestions for icing.   Use a frozen bag of peas or corn-inexpensive and molds well to your body, usually stays frozen for 10 to 20 minutes.  Wet a towel with cold water and squeeze out the excess until it's damp.  Place in a bag in the freezer for 20 minutes. Then remove and use. 

## 2013-03-22 ENCOUNTER — Encounter: Payer: 59 | Attending: General Surgery | Admitting: Dietician

## 2013-03-22 DIAGNOSIS — Z713 Dietary counseling and surveillance: Secondary | ICD-10-CM | POA: Insufficient documentation

## 2013-03-22 NOTE — Patient Instructions (Signed)
   Follow Pre-Op Nutrition Goals to prepare for Bariatric Surgery.  Try Protein Drinks.  Attend Pre Op Class on 04/01/13

## 2013-03-22 NOTE — Progress Notes (Signed)
  Pre-Op Visit:  Pre-Operative Sleeve Gastrectomy Surgery  Medical Nutrition Therapy:  Appt start time: 515   End time:  530.  Patient was seen on 03/22/2013 to check in to see if she needed to make any additional changes to her diet since she is have the sleeve gastrectomy now instead of the lap band. Instructed her to attend the Pre-Op class and discussed protein shakes.   Handouts given during visit include:  Pre-Op Goals Bariatric Surgery Protein Shakes  Samples given during visit include:  2 Nectar Grape, lot # [id number], exp: 01/17  Jerrilyn Cairo, lot # [id number], exp: 01/16  2 Unjury Chicken Soup, lot # [id number], exp: 01/16

## 2013-03-30 ENCOUNTER — Ambulatory Visit: Payer: 59

## 2013-04-01 ENCOUNTER — Encounter: Payer: 59 | Attending: General Surgery

## 2013-04-01 ENCOUNTER — Encounter: Payer: Self-pay | Admitting: Family Medicine

## 2013-04-01 DIAGNOSIS — Z713 Dietary counseling and surveillance: Secondary | ICD-10-CM | POA: Insufficient documentation

## 2013-04-02 NOTE — Progress Notes (Signed)
Pre-Operative Nutrition Class:  Appt start time: 1730   End time:  1930  Patient was seen on 04/01/2013 for Pre-Operative Bariatric Surgery Education at the [facility].   Surgery date: 04/26/2013 Surgery type: Gastric Sleeve  The following the learning objectives were met by the patient during this course:  Identify Pre-Op Dietary Goals and will begin 2 weeks pre-operatively  Identify appropriate sources of fluids and proteins   State protein recommendations and appropriate sources pre and post-operatively  Identify Post-Operative Dietary Goals and will follow for 2 weeks post-operatively  Identify appropriate multivitamin and calcium sources  Describe the need for physical activity post-operatively and will follow MD recommendations  State when to call healthcare provider regarding medication questions or post-operative complications  Handouts given during class include:  Pre-Op Bariatric Surgery Diet Handout  Protein Shake Handout  Post-Op Bariatric Surgery Nutrition Handout  [facility] Program Information Flyer  Support Group Information Flyer  [facility] Bariatric Supplements Price List  Follow-Up Plan: Patient will follow-up at [facility] 2 weeks post operatively for diet advancement per MD.   

## 2013-04-08 NOTE — Progress Notes (Signed)
Need orders in EPIC.  Surgery scheduled for 04/26/13.  Preop on 12/18/ at 200pm.  Need orders in EPIC.  Thank You.

## 2013-04-09 ENCOUNTER — Encounter: Payer: Self-pay | Admitting: Family Medicine

## 2013-04-09 ENCOUNTER — Encounter (INDEPENDENT_AMBULATORY_CARE_PROVIDER_SITE_OTHER): Payer: Self-pay | Admitting: General Surgery

## 2013-04-11 DIAGNOSIS — E039 Hypothyroidism, unspecified: Secondary | ICD-10-CM | POA: Insufficient documentation

## 2013-04-13 ENCOUNTER — Encounter (HOSPITAL_COMMUNITY): Payer: Self-pay | Admitting: Pharmacy Technician

## 2013-04-15 ENCOUNTER — Ambulatory Visit (INDEPENDENT_AMBULATORY_CARE_PROVIDER_SITE_OTHER): Payer: Commercial Managed Care - PPO | Admitting: General Surgery

## 2013-04-15 ENCOUNTER — Encounter (HOSPITAL_COMMUNITY)
Admission: RE | Admit: 2013-04-15 | Discharge: 2013-04-15 | Disposition: A | Discharge status: Home / Self Care | Payer: 59 | Source: Ambulatory Visit | Attending: General Surgery | Admitting: General Surgery

## 2013-04-15 ENCOUNTER — Encounter (HOSPITAL_COMMUNITY): Payer: Self-pay

## 2013-04-15 DIAGNOSIS — Z01812 Encounter for preprocedural laboratory examination: Secondary | ICD-10-CM | POA: Insufficient documentation

## 2013-04-15 DIAGNOSIS — Z6841 Body Mass Index (BMI) 40.0 and over, adult: Secondary | ICD-10-CM

## 2013-04-15 HISTORY — DX: Personal history of other endocrine, nutritional and metabolic disease: Z86.39

## 2013-04-15 HISTORY — DX: Pure hypercholesterolemia, unspecified: E78.00

## 2013-04-15 HISTORY — DX: Other specified postprocedural states: R11.2

## 2013-04-15 HISTORY — DX: Depression, unspecified: F32.A

## 2013-04-15 HISTORY — DX: Other specified postprocedural states: Z98.890

## 2013-04-15 HISTORY — DX: Pneumonia, unspecified organism: J18.9

## 2013-04-15 HISTORY — DX: Major depressive disorder, single episode, unspecified: F32.9

## 2013-04-15 HISTORY — DX: Benign neoplasm of peripheral nerves and autonomic nervous system, unspecified: D36.10

## 2013-04-15 HISTORY — DX: Hypothyroidism, unspecified: E03.9

## 2013-04-15 HISTORY — DX: Headache: R51

## 2013-04-15 LAB — CBC
MCH: 28.2 pg (ref 26.0–34.0)
MCV: 83.9 fL (ref 78.0–100.0)
Platelets: 253 10*3/uL (ref 150–400)
RBC: 4.61 MIL/uL (ref 3.87–5.11)
RDW: 13.5 % (ref 11.5–15.5)
WBC: 7.1 10*3/uL (ref 4.0–10.5)

## 2013-04-15 LAB — BASIC METABOLIC PANEL
CO2: 27 mEq/L (ref 19–32)
Calcium: 9.3 mg/dL (ref 8.4–10.5)
Chloride: 100 mEq/L (ref 96–112)
Creatinine, Ser: 0.89 mg/dL (ref 0.50–1.10)
GFR calc Af Amer: 85 mL/min — ABNORMAL LOW (ref >90)
Potassium: 4 mEq/L (ref 3.5–5.1)
Sodium: 135 mEq/L (ref 135–145)

## 2013-04-15 NOTE — Patient Instructions (Signed)
20 Tasha Pearson  04/15/2013   Your procedure is scheduled on: 04/26/13  Report to [facility] at 7:00 AM.  Call this number if you have problems the morning of surgery [phone]   Remember:   Do not eat food or drink liquids After Midnight.     Take these medicines the morning of surgery with A SIP OF WATER: wellbutrin, prozac, synthroid   Do not wear jewelry, make-up or nail polish.  Do not wear lotions, powders, or perfumes. You may wear deodorant.  Do not shave 48 hours prior to surgery. Men may shave face and neck.  Do not bring valuables to the hospital.  Contacts, dentures or bridgework may not be worn into surgery.  Leave suitcase in the car. After surgery it may be brought to your room.  For patients admitted to the hospital, checkout time is 11:00 AM the day of discharge.  Birdie Sons, RN  pre op nurse call if needed [phone]    FAILURE TO FOLLOW THESE INSTRUCTIONS MAY RESULT IN CANCELLATION OF YOUR SURGERY   Patient Signature: ___________________________________________

## 2013-04-15 NOTE — Progress Notes (Signed)
Chief complaint: Preop sleeve gastrectomy  History: Patient returns for a preop visit prior to planned laparoscopic sleeve gastrectomy. After our initial consultation the patient was leaning toward lap band surgery but she did some further research and talking to the patient and her physician and has decided to pursue a sleeve gastrectomy. We went over the procedures in detail again today including lap band and sleeve gastrectomy and pros and cons of each. We had previously discussed that she was a little on the high BMI and for lap band and that some patients struggle to lose weight. I do believe she would be an excellent candidate for sleeve gastrectomy. She does not have any clinically evident reflux although her upper GI series did show a small hiatal hernia and some degree of reflux. We discussed that we would look for a hiatal hernia and repair this if we found at the time of surgery. We reviewed a sleeve gastrectomy in detail. We went to the consent form the line by line and all her questions were answered.  Past Medical History  Diagnosis Date  . Thyroid disease   . Arthritis   . Morbid obesity   . IBS (irritable bowel syndrome)     Pt reported on 01/18/13  . Hypertension     Pt reported 01/18/13   Past Surgical History  Procedure Laterality Date  . Cesarean section    . Cholecystectomy    . Abdominal hysterectomy    . Spine surgery     Current Outpatient Prescriptions  Medication Sig Dispense Refill  . acetaminophen (TYLENOL) 500 MG tablet Take 500 mg by mouth every 6 (six) hours as needed for pain.      Marland Kitchen buPROPion (WELLBUTRIN XL) 150 MG 24 hr tablet Take 1 tablet (150 mg total) by mouth every morning.  90 tablet  1  . FLUoxetine (PROZAC) 20 MG capsule Take 20 mg by mouth every morning.      Marland Kitchen levothyroxine (SYNTHROID, LEVOTHROID) 125 MCG tablet Take 1 tablet (125 mcg total) by mouth daily before breakfast.  90 tablet  3  . traMADol (ULTRAM) 50 MG tablet Take 50 mg by mouth every 6  (six) hours as needed for pain.       No current facility-administered medications for this visit.   Allergies  Allergen Reactions  . Penicillins Rash   History  Substance Use Topics  . Smoking status: Former Games developer  . Smokeless tobacco: Former Neurosurgeon    Quit date: 01/01/2009  . Alcohol Use: Yes     Comment: occasional glass of wine   Exam: BP 136/78  Pulse 77  Temp(Src) 97.8 F (36.6 C)  Resp 18  Ht 5\' 5"  (1.651 m)  Wt 276 lb 3.2 oz (125.283 kg)  BMI 45.96 kg/m2 General: Obese but well appearing Skin: No retroflexion HEENT: No masses. Sclerae nonicteric Lungs: Clear equal breath sounds bilaterally Cardiac: Regular rate and rhythm. No edema Abdomen: Soft and nontender. No masses or organomegaly. Extremities: No swelling or deformity   Assessment and plan: morbid obesity unresponsive to medical management. She successfully completed her preoperative workup. no concerns on psychologic or nutrition evaluation. Lab work is unremarkable. Ready to proceed with laparoscopic sleeve gastrectomy. As above we will examined carefully for a hiatal hernia and repair if found.

## 2013-04-15 NOTE — Progress Notes (Signed)
Chest x-ray 01/19/13 on EPIC, EKG 01/19/13 on EPIC 

## 2013-04-20 NOTE — Progress Notes (Addendum)
Surgery time changed from 9:30 to 10:45 am on 04/26/13. Pt called and agreed to arrive at short stay at 0815 am on 04/26/13. Pt was concerned about medication to be taken on day of surgery. Went over instructions again with patient. Pt stated that she will wake up at 0300 am on 04/26/13 to take medication because she does not want any problems with surgery. No other questions or concerns.

## 2013-04-23 ENCOUNTER — Other Ambulatory Visit (INDEPENDENT_AMBULATORY_CARE_PROVIDER_SITE_OTHER): Payer: Self-pay | Admitting: General Surgery

## 2013-04-23 NOTE — Progress Notes (Signed)
Text paged Dr. Johna Sheriff to inform no orders for pt's surgery.  Asked him to place pre-surgery orders

## 2013-04-25 MED ORDER — LEVOFLOXACIN IN D5W 750 MG/150ML IV SOLN
750.0000 mg | INTRAVENOUS | Status: AC
  Administered 2013-04-26: 750 mg via INTRAVENOUS
  Filled 2013-04-25: qty 150

## 2013-04-26 ENCOUNTER — Encounter (HOSPITAL_COMMUNITY): Payer: 59 | Admitting: Anesthesiology

## 2013-04-26 ENCOUNTER — Inpatient Hospital Stay (HOSPITAL_COMMUNITY): Payer: 59 | Admitting: Anesthesiology

## 2013-04-26 ENCOUNTER — Encounter (HOSPITAL_COMMUNITY)
Admission: RE | Disposition: A | Discharge status: Home / Self Care | Payer: Self-pay | Source: Ambulatory Visit | Attending: General Surgery

## 2013-04-26 ENCOUNTER — Encounter (HOSPITAL_COMMUNITY): Payer: Self-pay | Admitting: *Deleted

## 2013-04-26 ENCOUNTER — Inpatient Hospital Stay (HOSPITAL_COMMUNITY)
Admission: RE | Admit: 2013-04-26 | Discharge: 2013-04-28 | DRG: 621 | Disposition: A | Discharge status: Home / Self Care | Payer: 59 | Source: Ambulatory Visit | Attending: General Surgery | Admitting: General Surgery

## 2013-04-26 DIAGNOSIS — F3289 Other specified depressive episodes: Secondary | ICD-10-CM | POA: Diagnosis present

## 2013-04-26 DIAGNOSIS — Z6841 Body Mass Index (BMI) 40.0 and over, adult: Secondary | ICD-10-CM

## 2013-04-26 DIAGNOSIS — F329 Major depressive disorder, single episode, unspecified: Secondary | ICD-10-CM | POA: Diagnosis present

## 2013-04-26 DIAGNOSIS — Z01812 Encounter for preprocedural laboratory examination: Secondary | ICD-10-CM

## 2013-04-26 DIAGNOSIS — Z87891 Personal history of nicotine dependence: Secondary | ICD-10-CM

## 2013-04-26 DIAGNOSIS — K449 Diaphragmatic hernia without obstruction or gangrene: Secondary | ICD-10-CM | POA: Diagnosis present

## 2013-04-26 DIAGNOSIS — M2550 Pain in unspecified joint: Secondary | ICD-10-CM

## 2013-04-26 HISTORY — PX: LAPAROSCOPIC GASTRIC SLEEVE RESECTION: SHX5895

## 2013-04-26 HISTORY — PX: UPPER GI ENDOSCOPY: SHX6162

## 2013-04-26 LAB — HEMOGLOBIN AND HEMATOCRIT, BLOOD: Hemoglobin: 12.9 g/dL (ref 12.0–15.0)

## 2013-04-26 SURGERY — GASTRECTOMY, SLEEVE, LAPAROSCOPIC
Anesthesia: General | Site: Esophagus

## 2013-04-26 MED ORDER — PROPOFOL 10 MG/ML IV BOLUS
INTRAVENOUS | Status: AC
  Filled 2013-04-26: qty 20

## 2013-04-26 MED ORDER — KETAMINE HCL 10 MG/ML IJ SOLN
INTRAMUSCULAR | Status: DC | PRN
  Administered 2013-04-26: 25 mg via INTRAVENOUS

## 2013-04-26 MED ORDER — ONDANSETRON HCL 4 MG/2ML IJ SOLN
INTRAMUSCULAR | Status: DC | PRN
  Administered 2013-04-26 (×2): 2 mg via INTRAVENOUS

## 2013-04-26 MED ORDER — BUPIVACAINE-EPINEPHRINE 0.25% -1:200000 IJ SOLN
INTRAMUSCULAR | Status: DC | PRN
  Administered 2013-04-26: 45 mL

## 2013-04-26 MED ORDER — SODIUM CHLORIDE 0.9 % IJ SOLN
INTRAMUSCULAR | Status: AC
  Filled 2013-04-26: qty 10

## 2013-04-26 MED ORDER — UNJURY CHOCOLATE CLASSIC POWDER: 2.0000 [oz_av] | Freq: Four times a day (QID) | ORAL | Status: DC

## 2013-04-26 MED ORDER — STERILE WATER FOR INJECTION IJ SOLN
INTRAMUSCULAR | Status: AC
  Filled 2013-04-26: qty 10

## 2013-04-26 MED ORDER — BIOTENE DRY MOUTH MT LIQD
15.0000 mL | Freq: Two times a day (BID) | OROMUCOSAL | Status: DC
  Administered 2013-04-27 (×2): 15 mL via OROMUCOSAL

## 2013-04-26 MED ORDER — KETAMINE HCL 10 MG/ML IJ SOLN
INTRAMUSCULAR | Status: AC
  Filled 2013-04-26: qty 1

## 2013-04-26 MED ORDER — SCOPOLAMINE 1 MG/3DAYS TD PT72
MEDICATED_PATCH | TRANSDERMAL | Status: AC
  Filled 2013-04-26: qty 1

## 2013-04-26 MED ORDER — ONDANSETRON HCL 4 MG/2ML IJ SOLN
INTRAMUSCULAR | Status: AC
  Filled 2013-04-26: qty 2

## 2013-04-26 MED ORDER — LIDOCAINE HCL (CARDIAC) 20 MG/ML IV SOLN
INTRAVENOUS | Status: AC
  Filled 2013-04-26: qty 5

## 2013-04-26 MED ORDER — PROPOFOL INFUSION 10 MG/ML OPTIME
INTRAVENOUS | Status: DC | PRN
  Administered 2013-04-26: 250 mL via INTRAVENOUS

## 2013-04-26 MED ORDER — NEOSTIGMINE METHYLSULFATE 1 MG/ML IJ SOLN
INTRAMUSCULAR | Status: DC | PRN
  Administered 2013-04-26: 3 mg via INTRAVENOUS

## 2013-04-26 MED ORDER — ACETAMINOPHEN 160 MG/5ML PO SOLN
650.0000 mg | ORAL | Status: DC | PRN
  Administered 2013-04-28: 650 mg via ORAL
  Filled 2013-04-26: qty 20.3

## 2013-04-26 MED ORDER — LACTATED RINGERS IV SOLN
INTRAVENOUS | Status: DC | PRN
  Administered 2013-04-26 (×2): via INTRAVENOUS

## 2013-04-26 MED ORDER — PROMETHAZINE HCL 25 MG/ML IJ SOLN
6.2500 mg | INTRAMUSCULAR | Status: AC | PRN
  Administered 2013-04-26 (×2): 6.25 mg via INTRAVENOUS

## 2013-04-26 MED ORDER — TISSEEL VH 10 ML EX KIT
PACK | CUTANEOUS | Status: DC | PRN
  Administered 2013-04-26: 10 mL

## 2013-04-26 MED ORDER — LIDOCAINE HCL (CARDIAC) 20 MG/ML IV SOLN
INTRAVENOUS | Status: DC | PRN
  Administered 2013-04-26: 30 mg via INTRAVENOUS

## 2013-04-26 MED ORDER — ONDANSETRON HCL 4 MG/2ML IJ SOLN
4.0000 mg | INTRAMUSCULAR | Status: DC | PRN
  Administered 2013-04-26 (×2): 4 mg via INTRAVENOUS
  Filled 2013-04-26: qty 2

## 2013-04-26 MED ORDER — UNJURY VANILLA POWDER
2.0000 [oz_av] | Freq: Four times a day (QID) | ORAL | Status: DC
  Administered 2013-04-28: 2 [oz_av] via ORAL

## 2013-04-26 MED ORDER — SCOPOLAMINE 1 MG/3DAYS TD PT72
1.0000 | MEDICATED_PATCH | Freq: Once | TRANSDERMAL | Status: DC
  Administered 2013-04-26: 1.5 mg via TRANSDERMAL
  Filled 2013-04-26: qty 1

## 2013-04-26 MED ORDER — CISATRACURIUM BESYLATE 20 MG/10ML IV SOLN
INTRAVENOUS | Status: AC
  Filled 2013-04-26: qty 10

## 2013-04-26 MED ORDER — FENTANYL CITRATE 0.05 MG/ML IJ SOLN
INTRAMUSCULAR | Status: DC | PRN
  Administered 2013-04-26 (×5): 50 ug via INTRAVENOUS

## 2013-04-26 MED ORDER — HYDROMORPHONE HCL PF 1 MG/ML IJ SOLN
INTRAMUSCULAR | Status: AC
  Filled 2013-04-26: qty 1

## 2013-04-26 MED ORDER — 0.9 % SODIUM CHLORIDE (POUR BTL) OPTIME
TOPICAL | Status: DC | PRN
  Administered 2013-04-26: 1000 mL

## 2013-04-26 MED ORDER — TISSEEL VH 10 ML EX KIT
PACK | CUTANEOUS | Status: AC
  Filled 2013-04-26: qty 1

## 2013-04-26 MED ORDER — CHLORHEXIDINE GLUCONATE 0.12 % MT SOLN
15.0000 mL | Freq: Two times a day (BID) | OROMUCOSAL | Status: DC
  Administered 2013-04-26 – 2013-04-28 (×3): 15 mL via OROMUCOSAL
  Filled 2013-04-26 (×6): qty 15

## 2013-04-26 MED ORDER — LACTATED RINGERS IV SOLN
INTRAVENOUS | Status: DC
  Administered 2013-04-26: 1000 mL via INTRAVENOUS

## 2013-04-26 MED ORDER — UNJURY CHICKEN SOUP POWDER: 2.0000 [oz_av] | Freq: Four times a day (QID) | ORAL | Status: DC

## 2013-04-26 MED ORDER — LACTATED RINGERS IR SOLN
Status: DC | PRN
  Administered 2013-04-26: 1000 mL

## 2013-04-26 MED ORDER — FENTANYL CITRATE 0.05 MG/ML IJ SOLN
INTRAMUSCULAR | Status: AC
  Filled 2013-04-26: qty 5

## 2013-04-26 MED ORDER — CISATRACURIUM BESYLATE (PF) 10 MG/5ML IV SOLN
INTRAVENOUS | Status: DC | PRN
  Administered 2013-04-26: 10 mg via INTRAVENOUS
  Administered 2013-04-26: 4 mg via INTRAVENOUS

## 2013-04-26 MED ORDER — OXYCODONE-ACETAMINOPHEN 5-325 MG/5ML PO SOLN
5.0000 mL | ORAL | Status: DC | PRN
  Administered 2013-04-27 – 2013-04-28 (×5): 10 mL via ORAL
  Filled 2013-04-26 (×5): qty 10

## 2013-04-26 MED ORDER — LACTATED RINGERS IV SOLN: INTRAVENOUS | Status: DC

## 2013-04-26 MED ORDER — HEPARIN SODIUM (PORCINE) 5000 UNIT/ML IJ SOLN
5000.0000 [IU] | INTRAMUSCULAR | Status: AC
  Administered 2013-04-26: 5000 [IU] via SUBCUTANEOUS
  Filled 2013-04-26: qty 1

## 2013-04-26 MED ORDER — EPHEDRINE SULFATE 50 MG/ML IJ SOLN
INTRAMUSCULAR | Status: DC | PRN
  Administered 2013-04-26: 15 mg via INTRAVENOUS
  Administered 2013-04-26: 10 mg via INTRAVENOUS

## 2013-04-26 MED ORDER — DEXAMETHASONE SODIUM PHOSPHATE 4 MG/ML IJ SOLN
INTRAMUSCULAR | Status: DC | PRN
  Administered 2013-04-26: 10 mg via INTRAVENOUS

## 2013-04-26 MED ORDER — DEXAMETHASONE SODIUM PHOSPHATE 10 MG/ML IJ SOLN
INTRAMUSCULAR | Status: AC
  Filled 2013-04-26: qty 1

## 2013-04-26 MED ORDER — MIDAZOLAM HCL 2 MG/2ML IJ SOLN
INTRAMUSCULAR | Status: AC
  Filled 2013-04-26: qty 2

## 2013-04-26 MED ORDER — KCL IN DEXTROSE-NACL 20-5-0.9 MEQ/L-%-% IV SOLN
INTRAVENOUS | Status: DC
  Administered 2013-04-26 – 2013-04-28 (×4): via INTRAVENOUS
  Filled 2013-04-26 (×6): qty 1000

## 2013-04-26 MED ORDER — GLYCOPYRROLATE 0.2 MG/ML IJ SOLN
INTRAMUSCULAR | Status: DC | PRN
  Administered 2013-04-26: 0.4 mg via INTRAVENOUS

## 2013-04-26 MED ORDER — GLYCOPYRROLATE 0.2 MG/ML IJ SOLN
INTRAMUSCULAR | Status: AC
  Filled 2013-04-26: qty 2

## 2013-04-26 MED ORDER — PROMETHAZINE HCL 25 MG/ML IJ SOLN
INTRAMUSCULAR | Status: AC
  Filled 2013-04-26: qty 1

## 2013-04-26 MED ORDER — BUPIVACAINE-EPINEPHRINE 0.25% -1:200000 IJ SOLN
INTRAMUSCULAR | Status: AC
  Filled 2013-04-26: qty 1

## 2013-04-26 MED ORDER — MORPHINE SULFATE 2 MG/ML IJ SOLN
2.0000 mg | INTRAMUSCULAR | Status: DC | PRN
  Administered 2013-04-26: 2 mg via INTRAVENOUS
  Administered 2013-04-26: 4 mg via INTRAVENOUS
  Administered 2013-04-26: 2 mg via INTRAVENOUS
  Administered 2013-04-27 (×2): 4 mg via INTRAVENOUS
  Filled 2013-04-26 (×3): qty 2
  Filled 2013-04-26 (×2): qty 1

## 2013-04-26 MED ORDER — LEVOTHYROXINE SODIUM 100 MCG IV SOLR
50.0000 ug | Freq: Every day | INTRAVENOUS | Status: DC
  Administered 2013-04-27 – 2013-04-28 (×2): 50 ug via INTRAVENOUS
  Filled 2013-04-26 (×3): qty 5

## 2013-04-26 MED ORDER — HYDROMORPHONE HCL PF 1 MG/ML IJ SOLN
0.2500 mg | INTRAMUSCULAR | Status: DC | PRN
  Administered 2013-04-26 (×4): 0.5 mg via INTRAVENOUS

## 2013-04-26 MED ORDER — HEPARIN SODIUM (PORCINE) 5000 UNIT/ML IJ SOLN
5000.0000 [IU] | Freq: Three times a day (TID) | INTRAMUSCULAR | Status: DC
  Administered 2013-04-26 – 2013-04-28 (×5): 5000 [IU] via SUBCUTANEOUS
  Filled 2013-04-26 (×8): qty 1

## 2013-04-26 MED ORDER — MIDAZOLAM HCL 5 MG/5ML IJ SOLN
INTRAMUSCULAR | Status: DC | PRN
  Administered 2013-04-26: 1.5 mg via INTRAVENOUS

## 2013-04-26 MED ORDER — SUCCINYLCHOLINE CHLORIDE 20 MG/ML IJ SOLN
INTRAMUSCULAR | Status: DC | PRN
  Administered 2013-04-26: 160 mg via INTRAVENOUS

## 2013-04-26 SURGICAL SUPPLY — 56 items
APPLICATOR COTTON TIP 6IN STRL (MISCELLANEOUS) ×3 IMPLANT
APPLIER CLIP ROT 10 11.4 M/L (STAPLE)
APPLIER CLIP ROT 13.4 12 LRG (CLIP)
BLADE SURG SZ11 CARB STEEL (BLADE) ×3 IMPLANT
CABLE HIGH FREQUENCY MONO STRZ (ELECTRODE) IMPLANT
CANISTER SUCTION 2500CC (MISCELLANEOUS) ×3 IMPLANT
CHLORAPREP W/TINT 26ML (MISCELLANEOUS) ×3 IMPLANT
CLIP APPLIE ROT 10 11.4 M/L (STAPLE) IMPLANT
CLIP APPLIE ROT 13.4 12 LRG (CLIP) IMPLANT
DERMABOND ADVANCED (GAUZE/BANDAGES/DRESSINGS) ×1
DERMABOND ADVANCED .7 DNX12 (GAUZE/BANDAGES/DRESSINGS) ×2 IMPLANT
DEVICE SUT QUICK LOAD TK 5 (STAPLE) ×6 IMPLANT
DEVICE SUT TI-KNOT TK 5X26 (MISCELLANEOUS) ×3 IMPLANT
DEVICE SUTURE ENDOST 10MM (ENDOMECHANICALS) IMPLANT
DEVICE TROCAR PUNCTURE CLOSURE (ENDOMECHANICALS) ×3 IMPLANT
DRAPE CAMERA CLOSED 9X96 (DRAPES) ×3 IMPLANT
DRAPE UTILITY XL STRL (DRAPES) ×6 IMPLANT
ELECT REM PT RETURN 9FT ADLT (ELECTROSURGICAL) ×3
ELECTRODE REM PT RTRN 9FT ADLT (ELECTROSURGICAL) ×2 IMPLANT
GLOVE BIOGEL PI IND STRL 7.5 (GLOVE) ×2 IMPLANT
GLOVE BIOGEL PI INDICATOR 7.5 (GLOVE) ×1
GLOVE SS BIOGEL STRL SZ 7.5 (GLOVE) ×2 IMPLANT
GLOVE SUPERSENSE BIOGEL SZ 7.5 (GLOVE) ×1
GOWN STRL REIN XL XLG (GOWN DISPOSABLE) ×21 IMPLANT
HOVERMATT SINGLE USE (MISCELLANEOUS) ×3 IMPLANT
KIT BASIN OR (CUSTOM PROCEDURE TRAY) ×3 IMPLANT
NEEDLE SPNL 22GX3.5 QUINCKE BK (NEEDLE) ×3 IMPLANT
PACK UNIVERSAL I (CUSTOM PROCEDURE TRAY) ×3 IMPLANT
PENCIL BUTTON HOLSTER BLD 10FT (ELECTRODE) ×3 IMPLANT
POUCH SPECIMEN RETRIEVAL 10MM (ENDOMECHANICALS) IMPLANT
RELOAD BLUE (STAPLE) ×12 IMPLANT
RELOAD GOLD (STAPLE) ×3 IMPLANT
RELOAD GREEN (STAPLE) ×3 IMPLANT
SCISSORS LAP 5X35 DISP (ENDOMECHANICALS) ×3 IMPLANT
SEALANT SURGICAL APPL DUAL CAN (MISCELLANEOUS) ×3 IMPLANT
SET IRRIG TUBING LAPAROSCOPIC (IRRIGATION / IRRIGATOR) ×3 IMPLANT
SHEARS CURVED HARMONIC AC 45CM (MISCELLANEOUS) ×3 IMPLANT
SLEEVE ADV FIXATION 5X100MM (TROCAR) ×3 IMPLANT
SLEEVE GASTRECTOMY 36FR VISIGI (MISCELLANEOUS) ×3 IMPLANT
SOLUTION ANTI FOG 6CC (MISCELLANEOUS) ×3 IMPLANT
SPONGE GAUZE 4X4 12PLY (GAUZE/BANDAGES/DRESSINGS) IMPLANT
SPONGE LAP 18X18 X RAY DECT (DISPOSABLE) ×3 IMPLANT
STAPLE ECHEON FLEX 60 POW ENDO (STAPLE) ×3 IMPLANT
SUT DEVICE BRAIDED 0X39 (SUTURE) ×6 IMPLANT
SUT ETHILON 2 0 PS N (SUTURE) IMPLANT
SUT MNCRL AB 4-0 PS2 18 (SUTURE) ×3 IMPLANT
SUT VICRYL 0 UR6 27IN ABS (SUTURE) ×6 IMPLANT
SYR 20CC LL (SYRINGE) ×3 IMPLANT
TOWEL OR NON WOVEN STRL DISP B (DISPOSABLE) ×3 IMPLANT
TRAY FOLEY CATH 14FRSI W/METER (CATHETERS) ×3 IMPLANT
TROCAR ADV FIXATION 5X100MM (TROCAR) ×3 IMPLANT
TROCAR BLADELESS 15MM (ENDOMECHANICALS) ×3 IMPLANT
TROCAR XCEL NON-BLD 5MMX100MML (ENDOMECHANICALS) ×3 IMPLANT
TUBING CONNECTING 10 (TUBING) ×3 IMPLANT
TUBING ENDO SMARTCAP PENTAX (MISCELLANEOUS) ×3 IMPLANT
TUBING FILTER THERMOFLATOR (ELECTROSURGICAL) ×3 IMPLANT

## 2013-04-26 NOTE — Anesthesia Preprocedure Evaluation (Addendum)
Anesthesia Evaluation  Patient identified by MRN, date of birth, ID band Patient awake    Reviewed: Allergy & Precautions, H&P , NPO status , Patient's Chart, lab work & pertinent test results  History of Anesthesia Complications (+) PONV  Airway Mallampati: II TM Distance: >3 FB Neck ROM: Full    Dental  (+) Teeth Intact, Dental Advisory Given and Caps   Pulmonary neg pulmonary ROS, former smoker,  breath sounds clear to auscultation  Pulmonary exam normal       Cardiovascular negative cardio ROS  Rhythm:Regular Rate:Normal     Neuro/Psych  Headaches, Depression negative neurological ROS  negative psych ROS   GI/Hepatic negative GI ROS, Neg liver ROS,   Endo/Other  neg diabetesHypothyroidism Morbid obesity  Renal/GU negative Renal ROS  negative genitourinary   Musculoskeletal negative musculoskeletal ROS (+)   Abdominal (+) + obese,   Peds  Hematology  (+) anemia ,   Anesthesia Other Findings   Reproductive/Obstetrics                          Anesthesia Physical Anesthesia Plan  ASA: III  Anesthesia Plan: General   Post-op Pain Management:    Induction: Intravenous  Airway Management Planned: Oral ETT  Additional Equipment:   Intra-op Plan:   Post-operative Plan: Extubation in OR  Informed Consent: I have reviewed the patients History and Physical, chart, labs and discussed the procedure including the risks, benefits and alternatives for the proposed anesthesia with the patient or authorized representative who has indicated his/her understanding and acceptance.     Plan Discussed with: CRNA  Anesthesia Plan Comments:         Anesthesia Quick Evaluation

## 2013-04-26 NOTE — Anesthesia Postprocedure Evaluation (Signed)
Anesthesia Post Note  Patient: Tasha Pearson  Procedure(s) Performed: Procedure(s) (LRB): LAPAROSCOPIC GASTRIC SLEEVE RESECTION (N/A) UPPER GI ENDOSCOPY (N/A)  Anesthesia type: General  Patient location: PACU  Post pain: Pain level controlled  Post assessment: Post-op Vital signs reviewed  Last Vitals:  Filed Vitals:   04/26/13 1430  BP: 127/83  Pulse: 76  Temp: 36.6 C  Resp: 12    Post vital signs: Reviewed  Level of consciousness: sedated  Complications: No apparent anesthesia complications

## 2013-04-26 NOTE — Op Note (Signed)
Preoperative Diagnosis: morbid obesity   Postoprative Diagnosis: morbid obesity and hiatal hernia  Procedure: Procedure(s): LAPAROSCOPIC GASTRIC SLEEVE RESECTION with hiatal hernia repair UPPER GI ENDOSCOPY   Surgeon: Glenna Fellows T   Assistants: Luretha Murphy  Anesthesia:  General endotracheal anesthesia  Indications: patient is a 52 year old female with progressive morbid obesity unresponsive to multiple efforts at medical management. After an extensive preoperative workup and discussion detailed elsewhere we elected to proceed with laparoscopic sleeve gastrectomy for treatment of her morbid obesity. Preoperative workup did show an apparent small sliding hiatal hernia we discussed repair of her hernia as well if it was confirmed at the time of surgery.  Procedure Detail: patient was brought to the operating room, placed in the supine position on the operating table, and general endotracheal anesthesia induced. She received preoperative IV antibiotics and subcutaneous heparin. PAS were in place. The abdomen was widely sterilely prepped and draped. A timeout was performed and correct procedure verified. Access was obtained without difficulty with a 5 mm Optiview trocar in the left upper quadrant and pneumoperitoneum established. Under direct vision a 5 mm trocar was placed laterally in the right upper quadrant, a 15 mm trocar in the right upper abdomen at the base of the falciform ligament, a 5 mm trocar just above and to the left of the umbilicus for the camera port. Through a 5 mm subxiphoid site the [facility] retractor was placed in the left lobe of the liver elevated with excellent exposure of the stomach and hiatus. There was an obvious moderate-sized hiatal hernia. An additional 5 mm trocar was placed laterally in the left upper quadrant. The VisiG gastric tube was introduced orally into the stomach which was completely decompressed and the tube withdrawn into the mid esophagus. 5  centimeters was carefully measured from the pylorus and the lesser omentum at this point was divided just below the greater curve with the harmonic scalpel. The free lesser sac was entered without difficulty. The dissection was continued proximally along the greater curve dividing lesser omental vessels and working up and dividing short gastrics. The fundus was completely mobilized away from the spleen and dissected off of the left crus which was dissected completely. There was an obvious hiatal hernia. Some fat was reduced from the left side after this dissection. Following this the gastrohepatic omentum was divided with harmonic scalpel and the right crus was identified and the peritoneum just anterior to the right crus incised. The peritoneum up to the anterior esophagus was also taken down and the hernia was completely reduced to where we had several centimeters of intra-abdominal esophagus. Posteriorly the crura were completely dissected and the junction of the crura identified posteriorly. A posterior repair was then performed with 2 interrupted 0 Ethibond sutures closing the hiatus down to a normal size. Following this some further avascular posterior attachments of the stomach to the retroperitoneum were divided with sharp dissection and the harmonic scalpel until the stomach was completely mobilized on its lesser curve vasculature. The VisiG tube was then again introduced down into the stomach at its tip guided to the pylorus and it was positioned along the lesser curve of the stomach in a symmetrical configuration and suction applied to fix the tube in place. An initial firing of the green load 60 mm echelon stapler was performed a 5 cm from the pylorus starting to angle up toward the incisura. A second firing of the gallbladder the echelon stapler was performed following this staying away from the VisiG tube at the  angularis. Following this several further firings of the blue load echelon stapler were  performed hugging the gastric tube up toward the EG junction with the final firing going lateral to the esophageal fat pad to complete the gastrectomy. Under saline irrigation the sleeve was then inflated and there was no evidence of leak. The staple line was smooth and without kinking or spiraling. The gastric tube was removed. Daphine Deutscher and performed upper endoscopy showing no evidence of bleeding or obstruction again there was no leakage under saline irrigation with the tube insufflated. Following this the gastrectomy specimen was removed through the 15 mm trocar site in the right upper quadrant after dilating the fascia slightly. The staple line of the entire sleeve was covered with Tisseel tissue sealant. The 15 mm trocar site was closed with 2-0 Vicryl sutures placed with the Endo Close. The Nathanson retractor was removed under direct vision and all CO2 evacuated. Trochars were removed and incisions closed with subcuticular 4-0 Monocryl and Dermabond. Sponge needle and instrument counts were correct.    Findings: Moderate sized hiatal hernia  Estimated Blood Loss:  Minimal         Drains: nnone  Blood Given: none          Specimens: greater curve of stomach        Complications:  * No complications entered in OR log *         Disposition: PACU - hemodynamically stable.         Condition: stable

## 2013-04-26 NOTE — Interval H&P Note (Signed)
History and Physical Interval Note:  04/26/2013 9:30 AM  Tasha Pearson  has presented today for surgery, with the diagnosis of morbid obesity   The various methods of treatment have been discussed with the patient and family. After consideration of risks, benefits and other options for treatment, the patient has consented to  Procedure(s): LAPAROSCOPIC GASTRIC SLEEVE RESECTION (N/A) as a surgical intervention .  The patient's history has been reviewed, patient examined, no change in status, stable for surgery.  I have reviewed the patient's chart and labs.  Questions were answered to the patient's satisfaction.     Maximillian Habibi T

## 2013-04-26 NOTE — H&P (View-Only) (Signed)
Pre-Operative Nutrition Class:  Appt start time: 1730   End time:  1930  Patient was seen on 04/01/2013 for Pre-Operative Bariatric Surgery Education at the [facility].   Surgery date: 04/26/2013 Surgery type: Gastric Sleeve  The following the learning objectives were met by the patient during this course:  Identify Pre-Op Dietary Goals and will begin 2 weeks pre-operatively  Identify appropriate sources of fluids and proteins   State protein recommendations and appropriate sources pre and post-operatively  Identify Post-Operative Dietary Goals and will follow for 2 weeks post-operatively  Identify appropriate multivitamin and calcium sources  Describe the need for physical activity post-operatively and will follow MD recommendations  State when to call healthcare provider regarding medication questions or post-operative complications  Handouts given during class include:  Pre-Op Bariatric Surgery Diet Handout  Protein Shake Handout  Post-Op Bariatric Surgery Nutrition Handout  [facility] Program Information Flyer  Support Group Information Flyer  [facility] Bariatric Supplements Price List  Follow-Up Plan: Patient will follow-up at [facility] 2 weeks post operatively for diet advancement per MD.   

## 2013-04-26 NOTE — Transfer of Care (Signed)
Immediate Anesthesia Transfer of Care Note  Patient: Tasha Pearson  Procedure(s) Performed: Procedure(s): LAPAROSCOPIC GASTRIC SLEEVE RESECTION (N/A) UPPER GI ENDOSCOPY (N/A)  Patient Location: PACU  Anesthesia Type:General  Level of Consciousness: awake, alert , oriented and sedated  Airway & Oxygen Therapy: Patient Spontanous Breathing and Patient connected to face mask oxygen  Post-op Assessment: Report given to PACU RN and Post -op Vital signs reviewed and stable  Post vital signs: stable  Complications: No apparent anesthesia complications

## 2013-04-27 ENCOUNTER — Encounter (HOSPITAL_COMMUNITY): Payer: Self-pay | Admitting: General Surgery

## 2013-04-27 ENCOUNTER — Inpatient Hospital Stay (HOSPITAL_COMMUNITY): Payer: 59

## 2013-04-27 LAB — HEMOGLOBIN AND HEMATOCRIT, BLOOD
HCT: 35.2 % — ABNORMAL LOW (ref 36.0–46.0)
Hemoglobin: 11.7 g/dL — ABNORMAL LOW (ref 12.0–15.0)

## 2013-04-27 LAB — CBC WITH DIFFERENTIAL/PLATELET
Basophils Absolute: 0 10*3/uL (ref 0.0–0.1)
Basophils Relative: 0 % (ref 0–1)
Eosinophils Absolute: 0 10*3/uL (ref 0.0–0.7)
Eosinophils Relative: 0 % (ref 0–5)
Hemoglobin: 12.4 g/dL (ref 12.0–15.0)
Lymphs Abs: 1.2 10*3/uL (ref 0.7–4.0)
MCH: 28.4 pg (ref 26.0–34.0)
MCV: 84 fL (ref 78.0–100.0)
Monocytes Absolute: 0.9 10*3/uL (ref 0.1–1.0)
Neutrophils Relative %: 78 % — ABNORMAL HIGH (ref 43–77)
Platelets: 284 10*3/uL (ref 150–400)
RDW: 13.6 % (ref 11.5–15.5)

## 2013-04-27 MED ORDER — IOHEXOL 300 MG/ML  SOLN
50.0000 mL | Freq: Once | INTRAMUSCULAR | Status: AC | PRN
  Administered 2013-04-27: 50 mL via ORAL

## 2013-04-27 NOTE — Care Management Note (Signed)
    Page 1 of 1   04/27/2013     10:46:06 AM   CARE MANAGEMENT NOTE 04/27/2013  Patient:  Tasha Pearson, Tasha Pearson   Account Number:  [id number]  Date Initiated:  04/27/2013  Documentation initiated by:  Lorenda Ishihara  Subjective/Objective Assessment:   52 yo female admitted s/p sleeve gastrectomy. PTA lived at home with sister.     Action/Plan:   Home when stable   Anticipated DC Date:  04/29/2013   Anticipated DC Plan:  HOME/SELF CARE      DC Planning Services  CM consult      Choice offered to / List presented to:             Status of service:  Completed, signed off Medicare Important Message given?   (If response is "NO", the following Medicare IM given date fields will be blank) Date Medicare IM given:   Date Additional Medicare IM given:    Discharge Disposition:  HOME/SELF CARE  Per UR Regulation:  Reviewed for med. necessity/level of care/duration of stay  If discussed at Long Length of Stay Meetings, dates discussed:    Comments:

## 2013-04-27 NOTE — Progress Notes (Signed)
Patient ID: Tasha Pearson, female   DOB: 03-15-1961, 52 y.o.   MRN: [id number] 1 Day Post-Op  Subjective: Mild pain controlled well with meds.  No nausea, tolerating water po well.  No other C/O  Objective: Vital signs in last 24 hours: Temp:  [97.6 F (36.4 C)-98.5 F (36.9 C)] 98 F (36.7 C) (12/30 1001) Pulse Rate:  [67-93] 80 (12/30 1001) Resp:  [12-17] 16 (12/30 1001) BP: (111-156)/(41-90) 126/81 mmHg (12/30 1001) SpO2:  [92 %-100 %] 95 % (12/30 1001) Last BM Date: 04/26/13  Intake/Output from previous day: 12/29 0701 - 12/30 0700 In: 3373.3 [I.V.:3373.3] Out: 1590 [Urine:1450; Blood:40] Intake/Output this shift: Total I/O In: -  Out: 500 [Urine:500]  General appearance: alert, cooperative and no distress GI: normal findings: soft, non-tender Incision/Wound: Clean and dry  Lab Results:   Recent Labs  04/26/13 1828 04/27/13 0540  WBC  --  9.7  HGB 12.9 12.4  HCT 38.2 36.7  PLT  --  284   BMET No results found for this basename: NA, K, CL, CO2, GLUCOSE, BUN, CREATININE, CALCIUM,  in the last 72 hours   Studies/Results: Dg Ugi W/water Sol Cm  04/27/2013   CLINICAL DATA:  Status post gastric sleeve procedure.  EXAM: WATER SOLUBLE UPPER GI SERIES  TECHNIQUE: Single-column upper GI series was performed using water soluble contrast.  CONTRAST:  50mL OMNIPAQUE IOHEXOL 300 MG/ML  SOLN  COMPARISON:  Upper GI series from 01/19/2013  FLUOROSCOPY TIME:  0 min and 56 seconds  FINDINGS: The patient was given approximately 50 cc of water-soluble contrast in 2 swallows. This shows free flow of contrast from the distal esophagus into the gastric sleeve. There is no evidence for obstruction of the gastric sleeve lumen. No contrast extravasation along the suture line to suggest leak. Gastric emptying is prompt into a normal-appearing duodenum C-loop. The moderate hiatal hernia that was visible on the preoperative study is not evident on today's exam.  IMPRESSION: Free flow of water  soluble contrast in to and out of the gastric sleeve without evidence for contrast extravasation/leak.  I called the results of this study to the operating room circulator at 0900 hr as Dr. Johna Sheriff was scrubbed in at surgery.   Electronically Signed   By: Kennith Center M.D.   On: 04/27/2013 09:02    Anti-infectives: Anti-infectives   Start     Dose/Rate Route Frequency Ordered Stop   04/26/13 0600  levofloxacin (LEVAQUIN) IVPB 750 mg     750 mg 100 mL/hr over 90 Minutes Intravenous On call to O.R. 04/25/13 1847 04/26/13 1015      Assessment/Plan: s/p Procedure(s): LAPAROSCOPIC GASTRIC SLEEVE RESECTION UPPER GI ENDOSCOPY Doing well without complications Starting POD 1 diet   LOS: 1 day    Osmany Azer T 04/27/2013

## 2013-04-28 LAB — CBC WITH DIFFERENTIAL/PLATELET
Basophils Absolute: 0 10*3/uL (ref 0.0–0.1)
Basophils Relative: 1 % (ref 0–1)
Eosinophils Absolute: 0.1 10*3/uL (ref 0.0–0.7)
Hemoglobin: 11.7 g/dL — ABNORMAL LOW (ref 12.0–15.0)
MCH: 28.2 pg (ref 26.0–34.0)
MCHC: 32.2 g/dL (ref 30.0–36.0)
Monocytes Relative: 10 % (ref 3–12)
Neutrophils Relative %: 58 % (ref 43–77)
Platelets: 229 10*3/uL (ref 150–400)
RDW: 14.1 % (ref 11.5–15.5)

## 2013-04-28 MED ORDER — OXYCODONE HCL 5 MG/5ML PO SOLN: 5.0000 mg | ORAL | Status: DC | PRN

## 2013-04-28 NOTE — Progress Notes (Signed)
Patient dressed and ready for d/c home. D/C instructions given. Tolerating water and a.m. dose of unjury. Ambulates independently. Relayed she had a bm & is passing flatus. No c/o pain/discomfort. Belongings packed. Daughter present to transport home. Patient to vehicle via w/c per staff member.

## 2013-04-28 NOTE — Discharge Summary (Signed)
   Patient ID: Tasha Pearson [id number] 52 y.o. 02/16/1961  04/26/2013  Discharge date and time: 04/28/2013   Admitting Physician: Glenna Fellows T  Discharge Physician: Glenna Fellows T  Admission Diagnoses: morbid obesity   Discharge Diagnoses: same  Operations: Procedure(s): LAPAROSCOPIC GASTRIC SLEEVE RESECTION UPPER GI ENDOSCOPY  Admission Condition: good  Discharged Condition: good  Indication for Admission: patient is a 52 year old female with progressive morbid obesity unresponsive to medical management who presents with a BMI of 49. After extensive preoperative discussion and workup detailed elsewhere she is electively admitted for laparoscopic sleeve gastrectomy for treatment of her morbid obesity.  Hospital Course: on the morning of admission the patient underwent an uneventful laparoscopic sleeve gastrectomy. Her postoperative course was uncomplicated. Gastrografin swallow on the first postoperative day showed no leak or obstruction. She was started on water by mouth which he tolerated well. On the second postoperative day she was advanced to protein shakes. Abdomen is benign. Bowel sounds are all within normal limits. White blood count is normal. She is felt ready for discharge.  Disposition: Home  Patient Instructions:    Medication List         acetaminophen 500 MG tablet  Commonly known as:  TYLENOL  Take 500 mg by mouth every 6 (six) hours as needed for pain.     buPROPion 150 MG 24 hr tablet  Commonly known as:  WELLBUTRIN XL  Take 1 tablet (150 mg total) by mouth every morning.     FLUoxetine 20 MG capsule  Commonly known as:  PROZAC  Take 20 mg by mouth every morning.     levothyroxine 125 MCG tablet  Commonly known as:  SYNTHROID, LEVOTHROID  Take 1 tablet (125 mcg total) by mouth daily before breakfast.     oxyCODONE 5 MG/5ML solution  Commonly known as:  ROXICODONE  Take 5-10 mLs (5-10 mg total) by mouth every 4 (four) hours as needed.      traMADol 50 MG tablet  Commonly known as:  ULTRAM  Take 50 mg by mouth every 6 (six) hours as needed for pain.        Activity: activity as tolerated Diet: bariatric protein shakes Wound Care: none needed  Follow-up:  With Dr. Johna Sheriff in 3 weeks.  Signed: Mariella Saa MD, FACS  04/28/2013, 7:37 AM

## 2013-04-28 NOTE — Progress Notes (Signed)
Patient ID: Tasha Pearson, female   DOB: Aug 18, 1960, 52 y.o.   MRN: [id number] 2 Days Post-Op  Subjective: Feel somewhat gassy bloated and has not yet passed gas. However no significant pain and tolerating water well with no nausea  Objective: Vital signs in last 24 hours: Temp:  [98 F (36.7 C)-98.8 F (37.1 C)] 98 F (36.7 C) (12/31 0600) Pulse Rate:  [61-80] 67 (12/31 0600) Resp:  [16-18] 18 (12/31 0600) BP: (121-143)/(78-88) 127/78 mmHg (12/31 0600) SpO2:  [93 %-97 %] 97 % (12/31 0600) Last BM Date: 04/26/13  Intake/Output from previous day: 12/30 0701 - 12/31 0700 In: 2460 [P.O.:60; I.V.:2400] Out: 1700 [Urine:1700] Intake/Output this shift:    General appearance: alert, cooperative and no distress GI: normal findings: soft, non-tender Incision/Wound: clean and dry without evidence of infection  Lab Results:   Recent Labs  04/27/13 0540 04/27/13 1613 04/28/13 0613  WBC 9.7  --  6.8  HGB 12.4 11.7* 11.7*  HCT 36.7 35.2* 36.3  PLT 284  --  229   BMET No results found for this basename: NA, K, CL, CO2, GLUCOSE, BUN, CREATININE, CALCIUM,  in the last 72 hours   Studies/Results: Dg Ugi W/water Sol Cm  04/27/2013   CLINICAL DATA:  Status post gastric sleeve procedure.  EXAM: WATER SOLUBLE UPPER GI SERIES  TECHNIQUE: Single-column upper GI series was performed using water soluble contrast.  CONTRAST:  50mL OMNIPAQUE IOHEXOL 300 MG/ML  SOLN  COMPARISON:  Upper GI series from 01/19/2013  FLUOROSCOPY TIME:  0 min and 56 seconds  FINDINGS: The patient was given approximately 50 cc of water-soluble contrast in 2 swallows. This shows free flow of contrast from the distal esophagus into the gastric sleeve. There is no evidence for obstruction of the gastric sleeve lumen. No contrast extravasation along the suture line to suggest leak. Gastric emptying is prompt into a normal-appearing duodenum C-loop. The moderate hiatal hernia that was visible on the preoperative study is not  evident on today's exam.  IMPRESSION: Free flow of water soluble contrast in to and out of the gastric sleeve without evidence for contrast extravasation/leak.  I called the results of this study to the operating room circulator at 0900 hr as Dr. Johna Sheriff was scrubbed in at surgery.   Electronically Signed   By: Kennith Center M.D.   On: 04/27/2013 09:02    Anti-infectives: Anti-infectives   Start     Dose/Rate Route Frequency Ordered Stop   04/26/13 0600  levofloxacin (LEVAQUIN) IVPB 750 mg     750 mg 100 mL/hr over 90 Minutes Intravenous On call to O.R. 04/25/13 1847 04/26/13 1015      Assessment/Plan: s/p Procedure(s): LAPAROSCOPIC GASTRIC SLEEVE RESECTION UPPER GI ENDOSCOPY Doing well postoperatively without apparent complication. Start protein shakes this morning and plan discharge later today if tolerated well.   LOS: 2 days    Ziare Orrick T 04/28/2013

## 2013-04-28 NOTE — Progress Notes (Signed)
Came to visit patient at bedside on behalf of [employer] program for [employer] employees/dependents with [employer]. States she is doing well and does not think she needs further follow up. Pleasantly declined follow up post hospital discharge call. Left contact information and brochure at bedside. Appreciative of visit.  Raiford Noble, MSN-Ed, RN,BSN- [facility] Liaison-[phone]

## 2013-04-28 NOTE — Progress Notes (Signed)
Patient alert and oriented, pain is controlled. Patient is tolerating fluids, plan to advance to protein shake today.  Reviewed Gastric sleeve discharge instructions with patient and patient is able to articulate understanding.  Provided information on [facility] program, Support Group and [facility] outpatient pharmacy. All questions answered, will continue to monitor.  

## 2013-05-04 ENCOUNTER — Ambulatory Visit (INDEPENDENT_AMBULATORY_CARE_PROVIDER_SITE_OTHER): Payer: 59

## 2013-05-04 VITALS — BP 121/82 | HR 81 | Resp 16 | Ht 65.0 in | Wt 254.0 lb

## 2013-05-04 DIAGNOSIS — M898X9 Other specified disorders of bone, unspecified site: Secondary | ICD-10-CM

## 2013-05-04 DIAGNOSIS — M7750 Other enthesopathy of unspecified foot: Secondary | ICD-10-CM

## 2013-05-04 DIAGNOSIS — M79609 Pain in unspecified limb: Secondary | ICD-10-CM

## 2013-05-04 NOTE — Progress Notes (Signed)
   Subjective:    Patient ID: Tasha Pearson, female    DOB: February 08, 1961, 53 y.o.   MRN: [id number]  "Virl Axe okay kind of stayed off them.  Started hurting a little when I was doing the treadmill." Patient presents for orthotic pick up, instructions were given.    HPI    Review of Systems deferred at this visit     Objective:   Physical Exam Neurovascular status is intact pedal pulses palpable patient did have a flare up or she tried using a treadmill especially with a slight incline however at this time dispensed orthotics with break in wearing instructions the fit and contour well. 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 There is a longer term history in the past of plantar fascial symptomology as well although symptoms now been more Lisfranc's arthropathy and capsulitis. No changes in skin color pigment and hair growth at this time dispensed orthotics with written instructions.       Assessment & Plan:  Dispensed orthotics with oral and written instructions for break-in recheck in one to 2 months for adjustments if needed. Moderate activities avoid any inclines and the treadmill maintain a good shoes and orthoses at all times follow the break-in period  Harriet Masson DPM

## 2013-05-04 NOTE — Patient Instructions (Signed)

## 2013-05-11 ENCOUNTER — Encounter: Payer: 59 | Attending: General Surgery

## 2013-05-11 VITALS — Ht 65.0 in | Wt 249.5 lb

## 2013-05-11 DIAGNOSIS — Z713 Dietary counseling and surveillance: Secondary | ICD-10-CM | POA: Insufficient documentation

## 2013-05-11 DIAGNOSIS — Z6841 Body Mass Index (BMI) 40.0 and over, adult: Secondary | ICD-10-CM

## 2013-05-11 NOTE — Progress Notes (Signed)
Bariatric Class:  Appt start time: 1530 end time:  1630.  2 Week Post-Operative Nutrition Class  Patient was seen on 05/11/13 for Post-Operative Nutrition education at [facility].   Surgery date: 04/26/13 Surgery type: Gastric Sleeve  Starting weight at [facility] 282 lbs on 03/22/13 Weight today: 249.5 lb  Weight change: 22.5 lb Total weight lost: 22.5 lb  TANITA  BODY COMP RESULTS  05/11/13   BMI (kg/m^2) 41.5   Fat Mass (lbs) 131.5   Fat Free Mass (lbs) 118.0   Total Body Water (lbs) 86.5   The following the learning objectives were met by the patient during this course:  Identifies Phase 3A (Soft, High Proteins) Dietary Goals and will begin from 2 weeks post-operatively to 2 months post-operatively  Identifies appropriate sources of fluids and proteins   States protein recommendations and appropriate sources post-operatively  Identifies the need for appropriate texture modifications, mastication, and bite sizes when consuming solids  Identifies appropriate multivitamin and calcium sources post-operatively  Describes the need for physical activity post-operatively and will follow MD recommendations  States when to call healthcare provider regarding medication questions or post-operative complications  Handouts given during class include:  Phase 3A: Soft, High Protein Diet Handout  Follow-Up Plan: Patient will follow-up at [facility] in 6 weeks for 8 week post-op nutrition visit for diet advancement per MD.

## 2013-05-13 ENCOUNTER — Other Ambulatory Visit (INDEPENDENT_AMBULATORY_CARE_PROVIDER_SITE_OTHER): Payer: Self-pay

## 2013-05-13 ENCOUNTER — Telehealth (INDEPENDENT_AMBULATORY_CARE_PROVIDER_SITE_OTHER): Payer: Self-pay

## 2013-05-13 DIAGNOSIS — K912 Postsurgical malabsorption, not elsewhere classified: Secondary | ICD-10-CM

## 2013-05-13 DIAGNOSIS — E860 Dehydration: Secondary | ICD-10-CM

## 2013-05-13 DIAGNOSIS — Z09 Encounter for follow-up examination after completed treatment for conditions other than malignant neoplasm: Secondary | ICD-10-CM

## 2013-05-13 LAB — CBC WITH DIFFERENTIAL/PLATELET
BASOS ABS: 0.1 10*3/uL (ref 0.0–0.1)
Basophils Relative: 1 % (ref 0–1)
EOS PCT: 3 % (ref 0–5)
Eosinophils Absolute: 0.2 10*3/uL (ref 0.0–0.7)
HEMATOCRIT: 41.9 % (ref 36.0–46.0)
HEMOGLOBIN: 14.3 g/dL (ref 12.0–15.0)
Lymphocytes Relative: 31 % (ref 12–46)
Lymphs Abs: 1.9 10*3/uL (ref 0.7–4.0)
MCH: 28.4 pg (ref 26.0–34.0)
MCHC: 34.1 g/dL (ref 30.0–36.0)
MCV: 83.3 fL (ref 78.0–100.0)
MONOS PCT: 10 % (ref 3–12)
Monocytes Absolute: 0.6 10*3/uL (ref 0.1–1.0)
Neutro Abs: 3.3 10*3/uL (ref 1.7–7.7)
Neutrophils Relative %: 55 % (ref 43–77)
Platelets: 361 10*3/uL (ref 150–400)
RBC: 5.03 MIL/uL (ref 3.87–5.11)
RDW: 14.7 % (ref 11.5–15.5)
WBC: 6.1 10*3/uL (ref 4.0–10.5)

## 2013-05-13 LAB — COMPREHENSIVE METABOLIC PANEL
ALBUMIN: 4.4 g/dL (ref 3.5–5.2)
ALT: 18 U/L (ref 0–35)
AST: 18 U/L (ref 0–37)
Alkaline Phosphatase: 77 U/L (ref 39–117)
BUN: 18 mg/dL (ref 6–23)
CHLORIDE: 101 meq/L (ref 96–112)
CO2: 27 mEq/L (ref 19–32)
CREATININE: 0.72 mg/dL (ref 0.50–1.10)
Calcium: 9.8 mg/dL (ref 8.4–10.5)
GLUCOSE: 106 mg/dL — AB (ref 70–99)
Potassium: 4 mEq/L (ref 3.5–5.3)
Sodium: 141 mEq/L (ref 135–145)
Total Bilirubin: 0.5 mg/dL (ref 0.3–1.2)
Total Protein: 7.3 g/dL (ref 6.0–8.3)

## 2013-05-13 NOTE — Telephone Encounter (Signed)
Called patient to make aware that Dr. Excell Seltzer would like to obtain lab work and see patient tomorrow in his office.  Patient will go to [facility] for CBC & BMET.  Patient has appointment to come in on 05/14/13 @ 8:45 am w/Dr. Excell Seltzer.

## 2013-05-13 NOTE — Telephone Encounter (Signed)
Pt called stating she has been having increasing reflux with heartburn. Pt states she sips slowly and still has to sleep sitting up. No vomiting but urine becoming very concentrated and volume decreased. Pt concerned she is getting dehydrated. Pt states she is unable to take in enough fluid. Pt advised this will be sent to Dr Excell Seltzer to review. Pt can be reached at [phone].

## 2013-05-13 NOTE — Addendum Note (Signed)
Addended by: Ivor Costa on: 05/13/2013 11:47 AM   Modules accepted: Orders

## 2013-05-14 ENCOUNTER — Ambulatory Visit (INDEPENDENT_AMBULATORY_CARE_PROVIDER_SITE_OTHER): Payer: Commercial Managed Care - PPO | Admitting: General Surgery

## 2013-05-14 ENCOUNTER — Encounter (INDEPENDENT_AMBULATORY_CARE_PROVIDER_SITE_OTHER): Payer: Self-pay | Admitting: General Surgery

## 2013-05-14 VITALS — BP 132/90 | HR 82 | Temp 97.9°F | Resp 15 | Ht 65.0 in | Wt 246.8 lb

## 2013-05-14 DIAGNOSIS — Z6841 Body Mass Index (BMI) 40.0 and over, adult: Secondary | ICD-10-CM

## 2013-05-14 NOTE — Patient Instructions (Signed)
Take Prilosec OTC according to directions. Liquids only for now and sip fluids continually throughout the day

## 2013-05-14 NOTE — Progress Notes (Signed)
History: Patient returns to the office a little early following laparoscopic sleeve gastrectomy. She is concerned that she is getting dehydrated. She fills up very quickly with any soft food or liquid after just a bite or a swallow or 2. She feels that she is getting weak and fatigued. She is not having any vomiting or significant pain or fever. She is having some reflux at night.  Exam: BP 132/90  Pulse 82  Temp(Src) 97.9 F (36.6 C) (Oral)  Resp 15  Ht 5\' 5"  (1.651 m)  Wt 246 lb 12.8 oz (111.948 kg)  BMI 41.07 kg/m2 General: Does not appear ill HEENT: Mucous membranes moist Abdomen: Soft and nontender. Incisions are healing well  Lab Results  Component Value Date   WBC 6.1 05/13/2013   HGB 14.3 05/13/2013   HCT 41.9 05/13/2013   MCV 83.3 05/13/2013   PLT 361 05/13/2013   Lab Results  Component Value Date   CREATININE 0.72 05/13/2013   BUN 18 05/13/2013   NA 141 05/13/2013   K 4.0 05/13/2013   CL 101 05/13/2013   CO2 27 05/13/2013   Assessment and plan: Status post sleeve gastrectomy. She is having some apparent delayed emptying but no vomiting to suggest stricture or obstruction and nothing to suggest a leak. Her lab work is unremarkable does not show any evidence of severe dehydration. I recommended she continue to try to continually sip liquids throughout the day. We will start her on Prilosec for nighttime reflux. She is to call for any worsening symptoms and I will see her in one week back in the office.

## 2013-05-15 ENCOUNTER — Other Ambulatory Visit: Payer: Self-pay | Admitting: Family Medicine

## 2013-05-15 DIAGNOSIS — K219 Gastro-esophageal reflux disease without esophagitis: Secondary | ICD-10-CM

## 2013-05-15 MED ORDER — OMEPRAZOLE 20 MG PO CPDR: 20.0000 mg | DELAYED_RELEASE_CAPSULE | Freq: Two times a day (BID) | ORAL | Status: DC

## 2013-05-15 NOTE — Telephone Encounter (Signed)
Tasha Pearson has developed some GERD symptoms since having her sleeve gastrectomy.  Dr. Abran Cantor recommended that she take Prilosec.  This was sent in for her to the [place] in [facility].

## 2013-05-19 ENCOUNTER — Ambulatory Visit (INDEPENDENT_AMBULATORY_CARE_PROVIDER_SITE_OTHER): Payer: Commercial Managed Care - PPO | Admitting: General Surgery

## 2013-05-19 ENCOUNTER — Encounter (INDEPENDENT_AMBULATORY_CARE_PROVIDER_SITE_OTHER): Payer: Self-pay | Admitting: General Surgery

## 2013-05-19 VITALS — BP 124/72 | HR 81 | Temp 98.0°F | Resp 18 | Ht 65.0 in | Wt 247.6 lb

## 2013-05-19 DIAGNOSIS — Z6841 Body Mass Index (BMI) 40.0 and over, adult: Secondary | ICD-10-CM

## 2013-05-19 NOTE — Progress Notes (Signed)
History: Patient returns for short-term followup in 3 weeks following laparoscopic sleeve gastrectomy. She was seen 1 week ago with symptoms of reflux and possibly poor by mouth intake although no vomiting. Exam lab work was unremarkable. Over the past week she feels significantly better. Fluids are going much easier. She is still only able to tolerate a biter to solid food. Protein shakes are going well. She is taking Prilosec and having no nighttime reflux.  Exam: BP 124/72  Pulse 81  Temp(Src) 98 F (36.7 C) (Oral)  Resp 18  Ht 5\' 5"  (1.651 m)  Wt 247 lb 9.6 oz (112.311 kg)  BMI 41.20 kg/m2 General: Appears well Abdomen: Soft and nontender. Incisions healing well  Assessment and plan: Doing better at this point 3 weeks following sleeve gastrectomy with improved intake of fluids. She will continue to increase her exercise and push fluids and gradually had soft solid foods. She will call for any worsening symptoms and otherwise return in 3 weeks.

## 2013-06-10 ENCOUNTER — Ambulatory Visit (INDEPENDENT_AMBULATORY_CARE_PROVIDER_SITE_OTHER): Payer: Commercial Managed Care - PPO | Admitting: General Surgery

## 2013-06-25 ENCOUNTER — Encounter: Payer: Self-pay | Admitting: Family Medicine

## 2013-06-25 ENCOUNTER — Ambulatory Visit (INDEPENDENT_AMBULATORY_CARE_PROVIDER_SITE_OTHER): Payer: 59 | Admitting: Family Medicine

## 2013-06-25 VITALS — BP 124/85 | HR 83 | Resp 16 | Wt 224.0 lb

## 2013-06-25 DIAGNOSIS — R05 Cough: Secondary | ICD-10-CM

## 2013-06-25 DIAGNOSIS — R070 Pain in throat: Secondary | ICD-10-CM

## 2013-06-25 DIAGNOSIS — R059 Cough, unspecified: Secondary | ICD-10-CM

## 2013-06-25 DIAGNOSIS — R112 Nausea with vomiting, unspecified: Secondary | ICD-10-CM

## 2013-06-25 MED ORDER — CETIRIZINE HCL 10 MG PO TABS: 10.0000 mg | ORAL_TABLET | Freq: Every day | ORAL | Status: DC

## 2013-06-25 MED ORDER — ONDANSETRON 8 MG PO TBDP: 8.0000 mg | ORAL_TABLET | Freq: Three times a day (TID) | ORAL | Status: DC | PRN

## 2013-06-25 MED ORDER — DEXTROMETHORPHAN POLISTIREX 30 MG/5ML PO LQCR: 60.0000 mg | ORAL | Status: DC | PRN

## 2013-06-25 MED ORDER — FIRST-DUKES MOUTHWASH MT SUSP: 5.0000 mL | Freq: Four times a day (QID) | OROMUCOSAL | Status: DC | PRN

## 2013-06-25 MED ORDER — PROMETHAZINE HCL 25 MG RE SUPP: 25.0000 mg | Freq: Four times a day (QID) | RECTAL | Status: DC | PRN

## 2013-06-25 MED ORDER — BENZONATATE 200 MG PO CAPS: 200.0000 mg | ORAL_CAPSULE | Freq: Three times a day (TID) | ORAL | Status: DC | PRN

## 2013-06-25 MED ORDER — BENZOCAINE-MENTHOL 10-2.1 MG MT LOZG: 1.0000 | LOZENGE | Freq: Three times a day (TID) | OROMUCOSAL | Status: DC | PRN

## 2013-06-25 MED ORDER — HYDROCOD POLST-CHLORPHEN POLST 10-8 MG/5ML PO LQCR: 5.0000 mL | Freq: Two times a day (BID) | ORAL | Status: DC | PRN

## 2013-06-25 NOTE — Patient Instructions (Signed)
1)  Nausea - Zofran vs Phenergan as directed  2)  URI -  Head - Zyrtec 10 mg at night   Chest /Cough - Delsym 2 tsp BID + Tessalon Perles TID + Tussionex 1 tsp BID    Nausea and Vomiting Nausea is a sick feeling that often comes before throwing up (vomiting). Vomiting is a reflex where stomach contents come out of your mouth. Vomiting can cause severe loss of body fluids (dehydration). Children and elderly adults can become dehydrated quickly, especially if they also have diarrhea. Nausea and vomiting are symptoms of a condition or disease. It is important to find the cause of your symptoms. CAUSES   Direct irritation of the stomach lining. This irritation can result from increased acid production (gastroesophageal reflux disease), infection, food poisoning, taking certain medicines (such as nonsteroidal anti-inflammatory drugs), alcohol use, or tobacco use.  Signals from the brain.These signals could be caused by a headache, heat exposure, an inner ear disturbance, increased pressure in the brain from injury, infection, a tumor, or a concussion, pain, emotional stimulus, or metabolic problems.  An obstruction in the gastrointestinal tract (bowel obstruction).  Illnesses such as diabetes, hepatitis, gallbladder problems, appendicitis, kidney problems, cancer, sepsis, atypical symptoms of a heart attack, or eating disorders.  Medical treatments such as chemotherapy and radiation.  Receiving medicine that makes you sleep (general anesthetic) during surgery. DIAGNOSIS Your caregiver may ask for tests to be done if the problems do not improve after a few days. Tests may also be done if symptoms are severe or if the reason for the nausea and vomiting is not clear. Tests may include:  Urine tests.  Blood tests.  Stool tests.  Cultures (to look for evidence of infection).  X-rays or other imaging studies. Test results can help your caregiver make decisions about treatment or the need  for additional tests. TREATMENT You need to stay well hydrated. Drink frequently but in small amounts.You may wish to drink water, sports drinks, clear broth, or eat frozen ice pops or gelatin dessert to help stay hydrated.When you eat, eating slowly may help prevent nausea.There are also some antinausea medicines that may help prevent nausea. HOME CARE INSTRUCTIONS   Take all medicine as directed by your caregiver.  If you do not have an appetite, do not force yourself to eat. However, you must continue to drink fluids.  If you have an appetite, eat a normal diet unless your caregiver tells you differently.  Eat a variety of complex carbohydrates (rice, wheat, potatoes, bread), lean meats, yogurt, fruits, and vegetables.  Avoid high-fat foods because they are more difficult to digest.  Drink enough water and fluids to keep your urine clear or pale yellow.  If you are dehydrated, ask your caregiver for specific rehydration instructions. Signs of dehydration may include:  Severe thirst.  Dry lips and mouth.  Dizziness.  Dark urine.  Decreasing urine frequency and amount.  Confusion.  Rapid breathing or pulse. SEEK IMMEDIATE MEDICAL CARE IF:   You have blood or brown flecks (like coffee grounds) in your vomit.  You have black or bloody stools.  You have a severe headache or stiff neck.  You are confused.  You have severe abdominal pain.  You have chest pain or trouble breathing.  You do not urinate at least once every 8 hours.  You develop cold or clammy skin.  You continue to vomit for longer than 24 to 48 hours.  You have a fever. MAKE SURE YOU:  Understand these instructions.  Will watch your condition.  Will get help right away if you are not doing well or get worse. Document Released: 04/15/2005 Document Revised: 07/08/2011 Document Reviewed: 09/12/2010 [facility] Patient Information 2014 [place], [place].

## 2013-06-25 NOTE — Progress Notes (Signed)
Subjective:    Patient ID: Tasha Pearson, female    DOB: 12-09-1960, 53 y.o.   MRN: [id number]  HPI  Tasha Pearson is here today to discuss the nausea/vomiting she has been having since yesterday.  She undewent gastric bypass surgery on 04/26/13.  She has done well since the surgery but is concerned that she has had trouble keeping her food down.  She has also been having some URI symptoms.     Review of Systems  Constitutional: Positive for fatigue.  HENT: Positive for ear pain, postnasal drip, rhinorrhea and sore throat.   Respiratory: Positive for cough.   Gastrointestinal: Positive for nausea and vomiting.     Past Medical History  Diagnosis Date  . Morbid obesity   . IBS (irritable bowel syndrome)     Pt reported on 01/18/13  . Hypercholesteremia   . Hypothyroidism   . H/O Graves' disease   . Depression   . Pneumonia 20's    hx of  . Headache(784.0)     rare  . Arthritis     feet  . PONV (postoperative nausea and vomiting)     severe vomiting  . Schwannoma     left ear     Past Surgical History  Procedure Laterality Date  . Cesarean section      x2  . Cholecystectomy    . Abdominal hysterectomy    . Spine surgery      fusion, nerve release, and replaced a disc in lower back, l5-s1  . Laparoscopic gastric sleeve resection N/A 04/26/2013    Procedure: LAPAROSCOPIC GASTRIC SLEEVE RESECTION;  Surgeon: Edward Jolly, MD;  Location: [facility];  Service: General;  Laterality: N/A;  . 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 She quit  smoking in 01/2010.     Alcohol Use:  None   Drug Use:  None   Diet:  Regular   Exercise:  None   Hobbies: Cooking/ Scrapbooking/ Gardening     Family History  Problem Relation Age of Onset  . Cancer Mother   . Heart disease Father      Current Outpatient Prescriptions on File Prior to Visit  Medication Sig Dispense Refill  . acetaminophen (TYLENOL) 500 MG tablet Take 500 mg by mouth every 6 (six) hours as needed for pain.      Marland Kitchen levothyroxine (SYNTHROID, LEVOTHROID) 125 MCG tablet Take 1 tablet (125 mcg total) by mouth daily before breakfast.  90 tablet  3  . omeprazole (PRILOSEC) 20 MG capsule Take 1 capsule (20 mg total) by mouth 2 (two) times daily before a meal.  180 capsule  3   No current facility-administered medications on file prior to visit.     Allergies  Allergen Reactions  . Penicillins Rash      Objective:   Physical Exam  Nursing note and vitals reviewed. Constitutional: She appears well-nourished. No distress.  HENT:  Head: Normocephalic.  Mouth/Throat: No oropharyngeal exudate.  Eyes: Conjunctivae are normal. Right  eye exhibits no discharge. Left eye exhibits no discharge.  Neck: Neck supple.  Cardiovascular: Normal rate, regular rhythm and normal heart sounds.  Exam reveals no gallop and no friction rub.   No murmur heard. Pulmonary/Chest: Effort normal and breath sounds normal. She has no wheezes. She exhibits no tenderness.  Abdominal: Soft. Bowel sounds are normal. She exhibits no distension and no mass. There is no tenderness. There is no rebound and no guarding.  Lymphadenopathy:    She has no cervical adenopathy.  Neurological: She is alert.  Skin: Skin is warm and dry. No rash noted.  Psychiatric: She has a normal mood and affect.      Assessment & Plan:    Tasha Pearson was seen today for nausea and uri.  Diagnoses and associated orders for this visit:  Nausea with vomiting - ondansetron (ZOFRAN ODT) 8 MG disintegrating tablet; Take 1  tablet (8 mg total) by mouth every 8 (eight) hours as needed for nausea or vomiting. - promethazine (PHENERGAN) 25 MG suppository; Place 1 suppository (25 mg total) rectally every 6 (six) hours as needed for nausea or vomiting.  Cough - benzonatate (TESSALON) 200 MG capsule; Take 1 capsule (200 mg total) by mouth 3 (three) times daily as needed for cough. - chlorpheniramine-HYDROcodone (TUSSIONEX PENNKINETIC ER) 10-8 MG/5ML LQCR; Take 5 mLs by mouth every 12 (twelve) hours as needed. -     dextromethorphan (DELSYM) 30 MG/5ML liquid; Take 10 mLs (60 mg total) by mouth as needed for cough.  Throat pain - Diphenhyd-Hydrocort-Nystatin (FIRST-DUKES MOUTHWASH) SUSP; Use as directed 5 mLs in the mouth or throat 4 (four) times daily as needed. - cetirizine (ZYRTEC) 10 MG tablet; Take 1 tablet (10 mg total) by mouth at bedtime. - Benzocaine-Menthol (CEPACOL SORE THROAT) 10-2.1 MG LOZG; Use as directed 1 tablet in the mouth or throat 3 (three) times daily as needed.   TIME SPENT "FACE TO FACE" WITH PATIENT -  30 MINS

## 2013-06-29 ENCOUNTER — Ambulatory Visit: Payer: 59 | Admitting: Dietician

## 2013-07-02 ENCOUNTER — Ambulatory Visit: Payer: 59

## 2013-07-05 ENCOUNTER — Encounter: Payer: 59 | Attending: General Surgery | Admitting: Dietician

## 2013-07-05 VITALS — Ht 65.0 in | Wt 221.0 lb

## 2013-07-05 DIAGNOSIS — Z6841 Body Mass Index (BMI) 40.0 and over, adult: Secondary | ICD-10-CM

## 2013-07-05 DIAGNOSIS — Z713 Dietary counseling and surveillance: Secondary | ICD-10-CM | POA: Insufficient documentation

## 2013-07-05 NOTE — Patient Instructions (Addendum)
-  Try Biotin for hair loss -Try a new multivitamin -Work on increasing fluid intake -Keep eating protein first; increase to 60g per day -[employer] products   05/11/13 07/05/2013   BMI (kg/m^2) 41.5 36.8   Fat Mass (lbs) 131.5 108.5   Fat Free Mass (lbs) 118.0 112.5   Total Body Water (lbs) 86.5 82.5

## 2013-07-05 NOTE — Progress Notes (Signed)
Follow-up visit:  9 Weeks Post-Operative Gastric sleeve Surgery  Medical Nutrition Therapy:  Appt start time: 0815 end time:  0845.  Primary concerns today: Post-operative Bariatric Surgery Nutrition Management.  Tasha Pearson is here today with a 28.5 pound weight loss since her last visit in January. She reports feeling well. However, she is having trouble achieving her fluid and protein goals. She cannot eat chicken, as it gets "stuck." Tasha Pearson also reports a previous bout of dehydration. She mainly eats cheese, lunchmeat, yogurt+milk (cannot tolerate thickness of yogurt), SF jello, green beans, and PB2 with celery. Tasha Pearson states she has a severe aversion to the taste of her chewable multivitamins. She is willing to try a liquid or pill form.  Surgery date: 04/26/13 Surgery type: Gastric Sleeve  Starting weight at [facility] 282 lbs on 03/22/13 Weight today: 221 lb  Weight change: 28.5 lb Total weight lost: 63 lb Goal weight: 150 lbs  TANITA  BODY COMP RESULTS  05/11/13 07/05/2013   BMI (kg/m^2) 41.5 36.8   Fat Mass (lbs) 131.5 108.5   Fat Free Mass (lbs) 118.0 112.5   Total Body Water (lbs) 86.5 82.5    Preferred Learning Style:  No preference indicated   Learning Readiness:  Ready   24-hr recall: B (AM): protein shake (15g) Snk (AM): none  L (PM): lunchmeat+cheese cubes and Jello (14g) Snk (PM): Protein shake (15 g)  D (PM): yogurt and cheese (19g) Snk (PM): none  Fluid intake: 40-55 oz per pt estimation Estimated total protein intake: 50-60g  Medications: see list Supplementation: taking  Using straws: no Drinking while eating: no Hair loss: yes Carbonated beverages: no N/V/D/C: constipation Dumping syndrome: no   Recent physical activity:  None recently due to work schedule  Progress Towards Goal(s):  In progress.  Handouts given during visit include:  Phase 3B lean protein + non-starchy vegetables   Nutritional Diagnosis:  [place]-3.3 Overweight/obesity related to past  poor dietary habits and physical inactivity as evidenced by patient w/ recent Gastric sleeve surgery following dietary guidelines for continued weight loss.    Intervention:  Nutrition counseling provided.  Teaching Method Utilized:  Visual Auditory Hands on  Barriers to learning/adherence to lifestyle change: work schedule  Demonstrated degree of understanding via:  Teach Back   Monitoring/Evaluation:  Dietary intake, exercise, and body weight. Follow up in 1 month for 3 month post-op visit.

## 2013-07-21 ENCOUNTER — Encounter (INDEPENDENT_AMBULATORY_CARE_PROVIDER_SITE_OTHER): Payer: Self-pay | Admitting: General Surgery

## 2013-07-21 ENCOUNTER — Ambulatory Visit (INDEPENDENT_AMBULATORY_CARE_PROVIDER_SITE_OTHER): Payer: Commercial Managed Care - PPO | Admitting: General Surgery

## 2013-07-21 VITALS — BP 118/84 | HR 82 | Temp 98.1°F | Resp 16 | Ht 65.0 in | Wt 211.2 lb

## 2013-07-21 DIAGNOSIS — Z09 Encounter for follow-up examination after completed treatment for conditions other than malignant neoplasm: Secondary | ICD-10-CM

## 2013-07-21 DIAGNOSIS — K912 Postsurgical malabsorption, not elsewhere classified: Secondary | ICD-10-CM

## 2013-07-21 DIAGNOSIS — Z6841 Body Mass Index (BMI) 40.0 and over, adult: Secondary | ICD-10-CM

## 2013-07-21 NOTE — Progress Notes (Signed)
Chief complaint: Followup sleeve gastrectomy  History: Patient returns for followup of sleeve gastrectomy now 3 months postoperative period she has not had any further nausea or vomiting as she was having early on. She does fill up very quickly with solid protein at about 2 ounces. She does have some degree of reflux still requiring Prilosec. She is not able to tolerate chicken but able to eat most meats and proteins. Overall she is very pleased with how she is doing, "the best thing I ever did"  Exam: BP 118/84  Pulse 82  Temp(Src) 98.1 F (36.7 C) (Temporal)  Resp 16  Ht 5\' 5"  (1.651 m)  Wt 211 lb 3.2 oz (95.8 kg)  BMI 35.15 kg/m2 Total weight loss 65 pounds, 36 pounds since last visit General: Well-appearing Caucasian female in no distress Abdomen: Soft and nontender. Incisions all well healed  Assessment and plan: Status post sleeve gastrectomy with excellent weight loss. She does have some reflux, well controlled with medications. She has very significant restriction but no vomiting. We reviewed diet and exercise strategies. I would hope and expect that she gets a little farther out from surgery she will have improvement of her reflux and a little less restriction. We will check CBC and chemistries today. Return in 3 months.

## 2013-08-09 ENCOUNTER — Encounter: Payer: 59 | Attending: General Surgery | Admitting: Dietician

## 2013-08-09 DIAGNOSIS — Z713 Dietary counseling and surveillance: Secondary | ICD-10-CM | POA: Insufficient documentation

## 2013-08-09 LAB — CBC WITH DIFFERENTIAL/PLATELET
Basophils Absolute: 0.1 10*3/uL (ref 0.0–0.1)
Basophils Relative: 1 % (ref 0–1)
Eosinophils Absolute: 0.2 10*3/uL (ref 0.0–0.7)
Eosinophils Relative: 3 % (ref 0–5)
HCT: 36 % (ref 36.0–46.0)
HEMOGLOBIN: 12.2 g/dL (ref 12.0–15.0)
LYMPHS ABS: 1.8 10*3/uL (ref 0.7–4.0)
LYMPHS PCT: 33 % (ref 12–46)
MCH: 28.2 pg (ref 26.0–34.0)
MCHC: 33.9 g/dL (ref 30.0–36.0)
MCV: 83.3 fL (ref 78.0–100.0)
MONO ABS: 0.5 10*3/uL (ref 0.1–1.0)
MONOS PCT: 9 % (ref 3–12)
NEUTROS PCT: 54 % (ref 43–77)
Neutro Abs: 2.9 10*3/uL (ref 1.7–7.7)
Platelets: 271 10*3/uL (ref 150–400)
RBC: 4.32 MIL/uL (ref 3.87–5.11)
RDW: 15.9 % — ABNORMAL HIGH (ref 11.5–15.5)
WBC: 5.4 10*3/uL (ref 4.0–10.5)

## 2013-08-09 LAB — COMPREHENSIVE METABOLIC PANEL
ALBUMIN: 4 g/dL (ref 3.5–5.2)
ALK PHOS: 61 U/L (ref 39–117)
ALT: 10 U/L (ref 0–35)
AST: 13 U/L (ref 0–37)
BUN: 15 mg/dL (ref 6–23)
CALCIUM: 9.9 mg/dL (ref 8.4–10.5)
CHLORIDE: 97 meq/L (ref 96–112)
CO2: 29 meq/L (ref 19–32)
Creat: 0.75 mg/dL (ref 0.50–1.10)
GLUCOSE: 77 mg/dL (ref 70–99)
Potassium: 3.6 mEq/L (ref 3.5–5.3)
Sodium: 138 mEq/L (ref 135–145)
Total Bilirubin: 0.6 mg/dL (ref 0.2–1.2)
Total Protein: 6.6 g/dL (ref 6.0–8.3)

## 2013-08-09 NOTE — Patient Instructions (Addendum)
-  Try a Biotin supplement for hair loss   TANITA  BODY COMP RESULTS  05/11/13 07/05/2013 08/09/2013   BMI (kg/m^2) 41.5 36.8 34.3   Fat Mass (lbs) 131.5 108.5 90.5   Fat Free Mass (lbs) 118.0 112.5 115.5   Total Body Water (lbs) 86.5 82.5 84.5

## 2013-08-09 NOTE — Progress Notes (Signed)
.  Follow-up visit:  13 Weeks Post-Operative Gastric sleeve Surgery  Medical Nutrition Therapy:  Appt start time: 0815 end time:  0845.  Primary concerns today: Post-operative Bariatric Surgery Nutrition Management.   Tasha Pearson returns today having lost 18 pounds of fat since her last visit. She is tolerating most foods, but sometimes has difficulty with scrambled eggs. Bell peppers give her indigestion. Having difficulty with high-acid foods like tomatoes. She still hates the taste of her chewable multivitamins and plans to purchase a pill-form.   Surgery date: 04/26/13 Surgery type: Gastric Sleeve  Starting weight at [facility] 282 lbs on 03/22/13 Weight today: 206 lb  Weight change: 15 lb (18 lbs fat) Total weight lost: 78 lb Goal weight: 150 lbs  TANITA  BODY COMP RESULTS  05/11/13 07/05/2013 08/09/2013   BMI (kg/m^2) 41.5 36.8 34.3   Fat Mass (lbs) 131.5 108.5 90.5   Fat Free Mass (lbs) 118.0 112.5 115.5   Total Body Water (lbs) 86.5 82.5 84.5    Preferred Learning Style:  No preference indicated   Learning Readiness:  Ready   24-hr recall: B (AM): protein shake (15g) Snk (AM): yogurt (15g)  L (PM): protein shake, 2-3 oz tuna with cucumbers and lettuce(30g) Snk (PM): jello with protein and hummus OR 2 oz cheese (7-14 g)  D (PM): sometimes skips Snk (PM): SF popsicles  Fluid intake: less than 64 oz per pt estimation; protein shakes, Vitamin water zero, water Estimated total protein intake: 50-60g  Medications: see list Supplementation: not taking every day  Using straws: no Drinking while eating: no Hair loss: yes Carbonated beverages: no N/V/D/C: constipation; Dulcolax suppository Dumping syndrome: no  Recent physical activity:  Riding bike; rode 15 miles on Saturday  Progress Towards Goal(s):  In progress.  Handouts given during visit include:  Phase 3B lean protein + non-starchy vegetables   Nutritional Diagnosis:  [place]-3.3 Overweight/obesity related to past poor  dietary habits and physical inactivity as evidenced by patient w/ recent Gastric sleeve surgery following dietary guidelines for continued weight loss.    Intervention:  Nutrition counseling provided.  Teaching Method Utilized:  Visual Auditory Hands on  Barriers to learning/adherence to lifestyle change: work schedule  Demonstrated degree of understanding via:  Teach Back   Monitoring/Evaluation:  Dietary intake, exercise, and body weight. Follow up in 3 months for 6 month post-op visit.

## 2013-08-16 ENCOUNTER — Other Ambulatory Visit: Payer: Self-pay | Admitting: Family Medicine

## 2013-08-16 ENCOUNTER — Other Ambulatory Visit: Payer: Self-pay | Admitting: *Deleted

## 2013-08-16 DIAGNOSIS — F329 Major depressive disorder, single episode, unspecified: Secondary | ICD-10-CM

## 2013-08-16 DIAGNOSIS — F3289 Other specified depressive episodes: Secondary | ICD-10-CM

## 2013-08-16 MED ORDER — BUPROPION HCL ER (XL) 150 MG PO TB24: 150.0000 mg | ORAL_TABLET | ORAL | Status: DC

## 2013-08-16 MED ORDER — FLUOXETINE HCL 20 MG PO CAPS: 20.0000 mg | ORAL_CAPSULE | Freq: Every morning | ORAL | Status: DC

## 2013-08-16 NOTE — Telephone Encounter (Signed)
Refilled patient's Wellbutrin and Prozac.  She has an appt on 09/10/2013 for medication refills  PG

## 2013-09-02 ENCOUNTER — Ambulatory Visit (INDEPENDENT_AMBULATORY_CARE_PROVIDER_SITE_OTHER): Payer: 59 | Admitting: Family Medicine

## 2013-09-02 ENCOUNTER — Encounter: Payer: Self-pay | Admitting: Family Medicine

## 2013-09-02 VITALS — BP 133/80 | HR 78 | Resp 16 | Ht 65.0 in | Wt 195.0 lb

## 2013-09-02 DIAGNOSIS — B373 Candidiasis of vulva and vagina: Secondary | ICD-10-CM

## 2013-09-02 DIAGNOSIS — B3731 Acute candidiasis of vulva and vagina: Secondary | ICD-10-CM

## 2013-09-02 DIAGNOSIS — N760 Acute vaginitis: Secondary | ICD-10-CM

## 2013-09-02 MED ORDER — FLUCONAZOLE 150 MG PO TABS: 150.0000 mg | ORAL_TABLET | Freq: Once | ORAL | Status: DC

## 2013-09-02 MED ORDER — TERCONAZOLE 0.8 % VA CREA: 1.0000 | TOPICAL_CREAM | Freq: Every day | VAGINAL | Status: DC

## 2013-09-02 MED ORDER — SULFAMETHOXAZOLE-TMP DS 800-160 MG PO TABS: 1.0000 | ORAL_TABLET | Freq: Two times a day (BID) | ORAL | Status: DC

## 2013-09-02 MED ORDER — CEFTRIAXONE SODIUM 1 G IJ SOLR
1.0000 g | Freq: Once | INTRAMUSCULAR | Status: AC
  Administered 2013-09-02: 1 g via INTRAMUSCULAR

## 2013-09-02 NOTE — Progress Notes (Signed)
Subjective:    Patient ID: Tasha Pearson, female    DOB: 01-Nov-1960, 53 y.o.   MRN: [id number].      HPI Tasha Pearson is here to have her left labia evaluated.  She developed pain and swelling 2 days and is now having a hard time sitting.  She has had small lumps on both labia for years.  She has not noticed any drainage.  She wonders if this has occurred because of her exercising.  She continues to do great with her weight loss. She has lost about 100 lbs since her sleeve gastrectomy the end of December.     Review of Systems  Constitutional: Negative for fever and fatigue.  Genitourinary: Negative for vaginal bleeding.  Skin:       Painful swelling   All other systems reviewed and are negative.    Past Medical History  Diagnosis Date  . Morbid obesity   . IBS (irritable bowel syndrome)     Pt reported on 01/18/13  . Hypercholesteremia   . Hypothyroidism   . H/O Graves' disease   . Depression   . Pneumonia 20's    hx of  . Headache(784.0)     rare  . Arthritis     feet  . PONV (postoperative nausea and vomiting)     severe vomiting  . Schwannoma     left ear     Past Surgical History  Procedure Laterality Date  . Cesarean section      x2  . Cholecystectomy    . Abdominal hysterectomy    . Spine surgery      fusion, nerve release, and replaced a disc in lower back, l5-s1  . Laparoscopic gastric sleeve resection N/A 04/26/2013    Procedure: LAPAROSCOPIC GASTRIC SLEEVE RESECTION;  Surgeon: Edward Jolly, MD;  Location: [facility];  Service: General;  Laterality: N/A;  . 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 She quit smoking in 01/2010.     Alcohol Use:  None   Drug Use:  None   Diet:  Regular   Exercise:  None   Hobbies: Cooking/ Scrapbooking/ Gardening     Family History  Problem Relation Age of Onset  . Cancer Mother   . Heart disease Father      Current Outpatient Prescriptions on File Prior to Visit  Medication Sig Dispense Refill  . acetaminophen (TYLENOL) 500 MG tablet Take 500 mg by mouth every 6 (six) hours as needed for pain.      Marland Kitchen buPROPion (WELLBUTRIN XL) 150 MG 24 hr tablet Take 1 tablet (150 mg total) by mouth every morning.  30 tablet  0  . FLUoxetine (PROZAC) 20 MG capsule Take 1 capsule (20 mg total) by mouth every morning.  30 capsule  0  . levothyroxine (SYNTHROID, LEVOTHROID) 125 MCG tablet Take 1 tablet (125 mcg total) by mouth daily before breakfast.  90 tablet  3  . omeprazole (PRILOSEC) 20 MG capsule Take 1 capsule (20  mg total) by mouth 2 (two) times daily before a meal.  180 capsule  3   No current facility-administered medications on file prior to visit.     Allergies  Allergen Reactions  . Penicillins Rash       Objective:   Physical Exam  Nursing note and vitals reviewed. Constitutional: She appears well-developed.  HENT:  Head: Normocephalic.  Cardiovascular: Normal rate and regular rhythm.   Genitourinary:    No vaginal discharge found.  Skin: Skin is warm and dry.  Psychiatric: She has a normal mood and affect. Her behavior is normal. Judgment and thought content normal.      Assessment & Plan:  Octa was seen today for labia pain/swelling.  Diagnoses and associated orders for this visit:  Labial infection Comments: I tried to drain a couple of the cysts and did not get much out so I decided that she needed to probably see either a dermatologist vs a GYN.  I spoke with Dr. Marisue Ivan office who agreed that this sounds more like a GYN issue so she is going  to be seen tomorrow at [facility]).  We went ahead and gave her an injection of Rocephin and she'll get started on Septra.  She is to sit in a warm tub tonight.   - sulfamethoxazole-trimethoprim (BACTRIM DS) 800-160 MG per tablet; Take 1 tablet by mouth 2 (two) times daily. - cefTRIAXone (ROCEPHIN) injection 1 g; Inject 1 g into the muscle once.  Candidiasis of vulva - fluconazole (DIFLUCAN) 150 MG tablet; Take 1 tablet (150 mg total) by mouth once. - terconazole (TERAZOL 3) 0.8 % vaginal cream; Place 1 applicator vaginally at bedtime. - cefTRIAXone (ROCEPHIN) injection 1 g; Inject 1 g into the muscle once.   TIME SPENT "FACE TO FACE" WITH PATIENT -  30 MINS

## 2013-09-03 ENCOUNTER — Encounter: Payer: Self-pay | Admitting: Women's Health

## 2013-09-03 ENCOUNTER — Telehealth: Payer: Self-pay | Admitting: *Deleted

## 2013-09-03 ENCOUNTER — Ambulatory Visit (INDEPENDENT_AMBULATORY_CARE_PROVIDER_SITE_OTHER): Payer: 59 | Admitting: Women's Health

## 2013-09-03 VITALS — BP 120/76 | Ht 65.0 in | Wt 196.0 lb

## 2013-09-03 DIAGNOSIS — B373 Candidiasis of vulva and vagina: Secondary | ICD-10-CM | POA: Insufficient documentation

## 2013-09-03 DIAGNOSIS — N898 Other specified noninflammatory disorders of vagina: Secondary | ICD-10-CM

## 2013-09-03 DIAGNOSIS — B3731 Acute candidiasis of vulva and vagina: Secondary | ICD-10-CM | POA: Insufficient documentation

## 2013-09-03 DIAGNOSIS — N760 Acute vaginitis: Secondary | ICD-10-CM | POA: Insufficient documentation

## 2013-09-03 NOTE — Patient Instructions (Signed)

## 2013-09-03 NOTE — Progress Notes (Signed)
Patient ID: JAKEIRA SEEMAN, female   DOB: 02-Jan-1961, 53 y.o.   MRN: [id number] Presents  for labial cyst evaluation, has had a recent 100 pound weight loss after wt loss surgery.,  Increased exercise, riding a bike most days of the week. 2001 TAH RSO for fibroids and endometriosis . Not sexually active greater than 10 years. Denies urinary symptoms, vaginal discharge, abdominal pain or fever. Was seen yesterday at primary care for labial cyst, I&D attempted, was placed on Septra,. Reports having sebaceous cysts on the labia for many years.  Exam: Appears well. External genitalia left labial swelling, 2-3 CM sebaceous cyst, numerous small sebaceous cysts on both right and left labia.  Left labial inclusion cyst  Plan: Cleansed with Betadine, injected with 1% Xylocaine, #11 blade, copious white  cystic material removed. Instructed to continue/ finished Septra, warm soaks, loose clothing. Encouraged padded bike shorts once area healed. Instructed to call if no relief or if symptoms have not resolved in one week. Return to office in one month for annual exam.

## 2013-09-03 NOTE — Telephone Encounter (Signed)
Sent patient her avs through the mail. 09-03-13 jk

## 2013-09-10 ENCOUNTER — Ambulatory Visit (INDEPENDENT_AMBULATORY_CARE_PROVIDER_SITE_OTHER): Payer: 59 | Admitting: Family Medicine

## 2013-09-10 ENCOUNTER — Encounter: Payer: Self-pay | Admitting: Family Medicine

## 2013-09-10 VITALS — BP 116/71 | HR 76 | Resp 16 | Ht 65.0 in | Wt 192.0 lb

## 2013-09-10 DIAGNOSIS — E038 Other specified hypothyroidism: Secondary | ICD-10-CM

## 2013-09-10 DIAGNOSIS — D509 Iron deficiency anemia, unspecified: Secondary | ICD-10-CM

## 2013-09-10 DIAGNOSIS — E785 Hyperlipidemia, unspecified: Secondary | ICD-10-CM

## 2013-09-10 DIAGNOSIS — K219 Gastro-esophageal reflux disease without esophagitis: Secondary | ICD-10-CM

## 2013-09-10 DIAGNOSIS — M545 Low back pain, unspecified: Secondary | ICD-10-CM

## 2013-09-10 DIAGNOSIS — F411 Generalized anxiety disorder: Secondary | ICD-10-CM

## 2013-09-10 DIAGNOSIS — K5900 Constipation, unspecified: Secondary | ICD-10-CM

## 2013-09-10 DIAGNOSIS — F3289 Other specified depressive episodes: Secondary | ICD-10-CM

## 2013-09-10 DIAGNOSIS — F329 Major depressive disorder, single episode, unspecified: Secondary | ICD-10-CM

## 2013-09-10 DIAGNOSIS — E559 Vitamin D deficiency, unspecified: Secondary | ICD-10-CM

## 2013-09-10 MED ORDER — FLUOXETINE HCL 20 MG PO CAPS: 20.0000 mg | ORAL_CAPSULE | Freq: Every day | ORAL | Status: DC

## 2013-09-10 MED ORDER — CYCLOBENZAPRINE HCL 10 MG PO TABS: 10.0000 mg | ORAL_TABLET | Freq: Every day | ORAL | Status: DC

## 2013-09-10 MED ORDER — VITAMIN D (ERGOCALCIFEROL) 1.25 MG (50000 UNIT) PO CAPS: 50000.0000 [IU] | ORAL_CAPSULE | ORAL | Status: AC

## 2013-09-10 MED ORDER — LUBIPROSTONE 8 MCG PO CAPS: 8.0000 ug | ORAL_CAPSULE | Freq: Two times a day (BID) | ORAL | Status: DC

## 2013-09-10 MED ORDER — LINACLOTIDE 145 MCG PO CAPS: 145.0000 ug | ORAL_CAPSULE | Freq: Every day | ORAL | Status: DC

## 2013-09-10 MED ORDER — METAXALONE 800 MG PO TABS: 800.0000 mg | ORAL_TABLET | Freq: Three times a day (TID) | ORAL | Status: DC

## 2013-09-10 MED ORDER — BUPROPION HCL ER (XL) 300 MG PO TB24: 300.0000 mg | ORAL_TABLET | ORAL | Status: DC

## 2013-09-10 MED ORDER — CYCLOBENZAPRINE HCL 10 MG PO TABS: 10.0000 mg | ORAL_TABLET | Freq: Every day | ORAL | Status: AC

## 2013-09-10 MED ORDER — LUBIPROSTONE 24 MCG PO CAPS: 24.0000 ug | ORAL_CAPSULE | Freq: Two times a day (BID) | ORAL | Status: DC

## 2013-09-10 NOTE — Progress Notes (Signed)
Subjective:    Patient ID: Tasha Pearson, female    DOB: 02/16/1961, 53 y.o.   MRN: [id number]  HPI  Tasha Pearson is here today to get medication refills and to go over the following conditions:  1)  Anxiety:  She is doing well on the combination of Prozac and Wellbutrin and would like to remain on them.  2)  Hypothyroidism:  She is needing a refill on her levothyroxine.  3)  GERD:  Controlled with Prilosec. She is needing a refill.  4)   Back Spasms:  She started having back spasms about 5 days ago. She has not taken any medications for this. Her pain worsens with movement.      Review of Systems  Constitutional: Negative for activity change, appetite change and fatigue.  Cardiovascular: Negative for chest pain, palpitations and leg swelling.  Endocrine: Negative for cold intolerance and heat intolerance.  Musculoskeletal: Positive for back pain.       She has been having back spasms  Psychiatric/Behavioral: Negative for behavioral problems and sleep disturbance. The patient is not nervous/anxious.   All other systems reviewed and are negative.   Past Medical History  Diagnosis Date  . Morbid obesity   . IBS (irritable bowel syndrome)     Pt reported on 01/18/13  . Hypercholesteremia   . Hypothyroidism   . H/O Graves' disease   . Depression   . Pneumonia 20's    hx of  . Headache(784.0)     rare  . Arthritis     feet  . PONV (postoperative nausea and vomiting)     severe vomiting  . Schwannoma     left ear     Past Surgical History  Procedure Laterality Date  . Cesarean section      x2  . Cholecystectomy    . Abdominal hysterectomy    . Spine surgery      fusion, nerve release, and replaced a disc in lower back, l5-s1  . Laparoscopic gastric sleeve resection N/A 04/26/2013    Procedure: LAPAROSCOPIC GASTRIC SLEEVE RESECTION;  Surgeon: Edward Jolly, MD;  Location: [facility];  Service: General;  Laterality: N/A;  . 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 She quit smoking in 01/2010.     Alcohol Use:  None   Drug Use:  None   Diet:  Regular   Exercise:  None   Hobbies: Cooking/ Scrapbooking/ Gardening     Family History  Problem Relation Age of Onset  . Pancreatic cancer Mother   . Heart disease Father   . Diabetes Brother      Current Outpatient Prescriptions on File Prior to Visit  Medication Sig Dispense Refill  . acetaminophen (TYLENOL) 500 MG tablet Take 500 mg by mouth every 6 (six) hours as needed for pain.      Marland Kitchen levothyroxine (SYNTHROID, LEVOTHROID) 125 MCG tablet Take 1 tablet (125 mcg total) by mouth daily before breakfast.  90 tablet  3   No current  facility-administered medications on file prior to visit.     Allergies  Allergen Reactions  . Penicillins Rash      Objective:   Physical Exam  Vitals reviewed. Constitutional: She is oriented to person, place, and time. She appears well-developed and well-nourished.  Cardiovascular: Normal rate and regular rhythm.   Pulmonary/Chest: Effort normal and breath sounds normal.  Neurological: She is alert and oriented to person, place, and time.  Skin: Skin is warm and dry.  Psychiatric: She has a normal mood and affect.       Assessment & Plan:    Cintya was seen today for medication management.  Diagnoses and associated orders for this visit:  Anxiety state, unspecified - FLUoxetine (PROZAC) 20 MG capsule; Take 1 capsule (20 mg total) by mouth daily.  Other specified acquired hypothyroidism - TSH - T4, free - T3,  free  GERD (gastroesophageal reflux disease)  Backache - metaxalone (SKELAXIN) 800 MG tablet; Take 1 tablet (800 mg total) by mouth 3 (three) times daily. - cyclobenzaprine (FLEXERIL) 10 MG tablet; Take 1 tablet (10 mg total) by mouth at bedtime.  Other and unspecified hyperlipidemia -     Her LDL is high so she will get back on Crestor.    - Lipid panel  Depressive disorder, not elsewhere classified  She is going to increase her Wellbutrin XL to 300 mg.    - buPROPion (WELLBUTRIN XL) 300 MG 24 hr tablet; Take 1 tablet (300 mg total) by mouth every morning.  Unspecified constipation  She is going to compare Amitiza vs Linzess.    - lubiprostone (AMITIZA) 24 MCG capsule; Take 1 capsule (24 mcg total) by mouth 2 (two) times daily with a meal. - lubiprostone (AMITIZA) 8 MCG capsule; Take 1 capsule (8 mcg total) by mouth 2 (two) times daily with a meal. - Linaclotide (LINZESS) 145 MCG CAPS capsule; Take 1 capsule (145 mcg total) by mouth daily.  Unspecified vitamin D deficiency - Vitamin D, Ergocalciferol, (DRISDOL) 50000 UNITS CAPS capsule; Take 1 capsule (50,000 Units total) by mouth every 7 (seven) days.  Anemia - IBC panel  TIME SPENT "FACE TO FACE" WITH PATIENT -  30 MINS

## 2013-09-10 NOTE — Patient Instructions (Signed)
1) Constipation - Try Amitiza vs Linzess.    2)  Hair Loss - Nu-Iron 150 mg 1 - 2 x per day.   3)  Energy - Increase the Wellbutrin to 300 mg per day.

## 2013-09-13 ENCOUNTER — Other Ambulatory Visit: Payer: Self-pay | Admitting: Family Medicine

## 2013-09-13 LAB — IRON AND TIBC
%SAT: 30 % (ref 20–55)
Iron: 94 ug/dL (ref 42–145)
TIBC: 316 ug/dL (ref 250–470)
UIBC: 222 ug/dL (ref 125–400)

## 2013-09-13 LAB — LIPID PANEL
Cholesterol: 245 mg/dL — ABNORMAL HIGH (ref 0–200)
HDL: 60 mg/dL (ref >39)
LDL Cholesterol: 175 mg/dL — ABNORMAL HIGH (ref 0–99)
Total CHOL/HDL Ratio: 4.1 Ratio
Triglycerides: 50 mg/dL (ref <150)
VLDL: 10 mg/dL (ref 0–40)

## 2013-09-14 LAB — T4, FREE: Free T4: 1.56 ng/dL (ref 0.80–1.80)

## 2013-09-14 LAB — T3, FREE: T3, Free: 2.6 pg/mL (ref 2.3–4.2)

## 2013-09-14 LAB — TSH: TSH: 0.111 u[IU]/mL — ABNORMAL LOW (ref 0.350–4.500)

## 2013-09-27 ENCOUNTER — Encounter: Payer: Self-pay | Admitting: Family Medicine

## 2013-09-29 ENCOUNTER — Ambulatory Visit (INDEPENDENT_AMBULATORY_CARE_PROVIDER_SITE_OTHER): Payer: 59 | Admitting: Women's Health

## 2013-09-29 ENCOUNTER — Encounter: Payer: Self-pay | Admitting: Women's Health

## 2013-09-29 ENCOUNTER — Other Ambulatory Visit: Payer: Self-pay | Admitting: Women's Health

## 2013-09-29 VITALS — BP 114/74 | Ht 65.5 in | Wt 189.2 lb

## 2013-09-29 DIAGNOSIS — Z1231 Encounter for screening mammogram for malignant neoplasm of breast: Secondary | ICD-10-CM

## 2013-09-29 DIAGNOSIS — Z01419 Encounter for gynecological examination (general) (routine) without abnormal findings: Secondary | ICD-10-CM

## 2013-09-29 NOTE — Patient Instructions (Signed)
Health Recommendations for Postmenopausal Women Respected and ongoing research has looked at the most common causes of death, disability, and poor quality of life in postmenopausal women. The causes include heart disease, diseases of blood vessels, diabetes, depression, cancer, and bone loss (osteoporosis). Many things can be done to help lower the chances of developing these and other common problems: CARDIOVASCULAR DISEASE Heart Disease: A heart attack is a medical emergency. Know the signs and symptoms of a heart attack. Below are things women can do to reduce their risk for heart disease.   Do not smoke. If you smoke, quit.  Aim for a healthy weight. Being overweight causes many preventable deaths. Eat a healthy and balanced diet and drink an adequate amount of liquids.  Get moving. Make a commitment to be more physically active. Aim for 30 minutes of activity on most, if not all days of the week.  Eat for heart health. Choose a diet that is low in saturated fat and cholesterol and eliminate trans fat. Include whole grains, vegetables, and fruits. Read and understand the labels on food containers before buying.  Know your numbers. Ask your caregiver to check your blood pressure, cholesterol (total, HDL, LDL, triglycerides) and blood glucose. Work with your caregiver on improving your entire clinical picture.  High blood pressure. Limit or stop your table salt intake (try salt substitute and food seasonings). Avoid salty foods and drinks. Read labels on food containers before buying. Eating well and exercising can help control high blood pressure. STROKE  Stroke is a medical emergency. Stroke may be the result of a blood clot in a blood vessel in the brain or by a brain hemorrhage (bleeding). Know the signs and symptoms of a stroke. To lower the risk of developing a stroke:  Avoid fatty foods.  Quit smoking.  Control your diabetes, blood pressure, and irregular heart rate. THROMBOPHLEBITIS  (BLOOD CLOT) OF THE LEG  Becoming overweight and leading a stationary lifestyle may also contribute to developing blood clots. Controlling your diet and exercising will help lower the risk of developing blood clots. CANCER SCREENING  Breast Cancer: Take steps to reduce your risk of breast cancer.  You should practice "breast self-awareness." This means understanding the normal appearance and feel of your breasts and should include breast self-examination. Any changes detected, no matter how small, should be reported to your caregiver.  After age 40, you should have a clinical breast exam (CBE) every year.  Starting at age 40, you should consider having a mammogram (breast X-ray) every year.  If you have a family history of breast cancer, talk to your caregiver about genetic screening.  If you are at high risk for breast cancer, talk to your caregiver about having an MRI and a mammogram every year.  Intestinal or Stomach Cancer: Tests to consider are a rectal exam, fecal occult blood, sigmoidoscopy, and colonoscopy. Women who are high risk may need to be screened at an earlier age and more often.  Cervical Cancer:  Beginning at age 30, you should have a Pap test every 3 years as long as the past 3 Pap tests have been normal.  If you have had past treatment for cervical cancer or a condition that could lead to cancer, you need Pap tests and screening for cancer for at least 20 years after your treatment.  If you had a hysterectomy for a problem that was not cancer or a condition that could lead to cancer, then you no longer need Pap tests.    If you are between ages 65 and 70, and you have had normal Pap tests going back 10 years, you no longer need Pap tests.  If Pap tests have been discontinued, risk factors (such as a new sexual partner) need to be reassessed to determine if screening should be resumed.  Some medical problems can increase the chance of getting cervical cancer. In these  cases, your caregiver may recommend more frequent screening and Pap tests.  Uterine Cancer: If you have vaginal bleeding after reaching menopause, you should notify your caregiver.  Ovarian cancer: Other than yearly pelvic exams, there are no reliable tests available to screen for ovarian cancer at this time except for yearly pelvic exams.  Lung Cancer: Yearly chest X-rays can detect lung cancer and should be done on high risk women, such as cigarette smokers and women with chronic lung disease (emphysema).  Skin Cancer: A complete body skin exam should be done at your yearly examination. Avoid overexposure to the sun and ultraviolet light lamps. Use a strong sun block cream when in the sun. All of these things are important in lowering the risk of skin cancer. MENOPAUSE Menopause Symptoms: Hormone therapy products are effective for treating symptoms associated with menopause:  Moderate to severe hot flashes.  Night sweats.  Mood swings.  Headaches.  Tiredness.  Loss of sex drive.  Insomnia.  Other symptoms. Hormone replacement carries certain risks, especially in older women. Women who use or are thinking about using estrogen or estrogen with progestin treatments should discuss that with their caregiver. Your caregiver will help you understand the benefits and risks. The ideal dose of hormone replacement therapy is not known. The Food and Drug Administration (FDA) has concluded that hormone therapy should be used only at the lowest doses and for the shortest amount of time to reach treatment goals.  OSTEOPOROSIS Protecting Against Bone Loss and Preventing Fracture: If you use hormone therapy for prevention of bone loss (osteoporosis), the risks for bone loss must outweigh the risk of the therapy. Ask your caregiver about other medications known to be safe and effective for preventing bone loss and fractures. To guard against bone loss or fractures, the following is recommended:  If  you are less than age 50, take 1000 mg of calcium and at least 600 mg of Vitamin D per day.  If you are greater than age 50 but less than age 70, take 1200 mg of calcium and at least 600 mg of Vitamin D per day.  If you are greater than age 70, take 1200 mg of calcium and at least 800 mg of Vitamin D per day. Smoking and excessive alcohol intake increases the risk of osteoporosis. Eat foods rich in calcium and vitamin D and do weight bearing exercises several times a week as your caregiver suggests. DIABETES Diabetes Melitus: If you have Type I or Type 2 diabetes, you should keep your blood sugar under control with diet, exercise and recommended medication. Avoid too many sweets, starchy and fatty foods. Being overweight can make control more difficult. COGNITION AND MEMORY Cognition and Memory: Menopausal hormone therapy is not recommended for the prevention of cognitive disorders such as Alzheimer's disease or memory loss.  DEPRESSION  Depression may occur at any age, but is common in elderly women. The reasons may be because of physical, medical, social (loneliness), or financial problems and needs. If you are experiencing depression because of medical problems and control of symptoms, talk to your caregiver about this. Physical activity and   exercise may help with mood and sleep. Community and volunteer involvement may help your sense of value and worth. If you have depression and you feel that the problem is getting worse or becoming severe, talk to your caregiver about treatment options that are best for you. ACCIDENTS  Accidents are common and can be serious in the elderly woman. Prepare your house to prevent accidents. Eliminate throw rugs, place hand bars in the bath, shower and toilet areas. Avoid wearing high heeled shoes or walking on wet, snowy, and icy areas. Limit or stop driving if you have vision or hearing problems, or you feel you are unsteady with you movements and  reflexes. HEPATITIS C Hepatitis C is a type of viral infection affecting the liver. It is spread mainly through contact with blood from an infected person. It can be treated, but if left untreated, it can lead to severe liver damage over years. Many people who are infected do not know that the virus is in their blood. If you are a "baby-boomer", it is recommended that you have one screening test for Hepatitis C. IMMUNIZATIONS  Several immunizations are important to consider having during your senior years, including:   Tetanus, diptheria, and pertussis booster shot.  Influenza every year before the flu season begins.  Pneumonia vaccine.  Shingles vaccine.  Others as indicated based on your specific needs. Talk to your caregiver about these. Document Released: 06/07/2005 Document Revised: 04/01/2012 Document Reviewed: 02/01/2008 Exit[employer] Patient Information 2014 [employer].  

## 2013-09-29 NOTE — Progress Notes (Signed)
Tasha Pearson [facility] 1960-08-30 [id number]    History:    Presents for annual exam. TAH RSO, occasional hot flashes. Not currently sexually active. History of hypothyroidism and IBS. 100lb weight loss s/p gastric sleeve (2014). Last colonoscopy/negative 5-7 years ago; last mammogram in 2007  Past medical history, past surgical history, family history and social history were all reviewed and documented in the EPIC chart. Cardiac catheter lab. Daughter [employer] for nursing, son senior in high school both doing well. Father deceased heart disease.  ROS:  A  12 point ROS was performed and pertinent positives and negatives are included.  Exam:  Filed Vitals:   09/29/13 0944  BP: 114/74    General appearance:  Normal Thyroid:  Symmetrical, normal in size, without palpable masses or nodularity. Respiratory  Auscultation:  Clear without wheezing or rhonchi Cardiovascular  Auscultation:  Regular rate, without rubs, murmurs or gallops  Edema/varicosities:  Not grossly evident Abdominal  Soft,nontender, without masses, guarding or rebound.  Liver/spleen:  No organomegaly noted  Hernia:  None appreciated  Skin  Inspection:  Grossly normal   Breasts: Examined lying and sitting.     Right: Without masses, retractions, discharge or axillary adenopathy.     Left: Without masses, retractions, discharge or axillary adenopathy. Gentitourinary   Inguinal/mons:  Normal without inguinal adenopathy  External genitalia:  Several small non-inflamed sebaceous cysts  BUS/Urethra/Skene's glands:  Normal  Vagina:  Normal  Cervix:  Absent  Uterus:  Absent  Adnexa/parametria:     Rt: Without masses or tenderness.   Lt: Without masses or tenderness.  Anus and perineum: Normal  Digital rectal exam: Normal sphincter tone without palpated masses or tenderness  Assessment/Plan:  53 y.o. DWF G2P2  for annual exam with no complaints.   2001 TAH with RSO for fibroids and endometriosis  Anxiety/depression,  hypothyroid-primary care manages labs and meds   1. Health Maintenance: SBE's, schedule mammogram, reviewed importance of annual screen. Continue with heart healthy diet and regular exercise. 2. Perimenopausal: DEXA scan recommended in 1 year.  3. reviewed importance of condoms if sexually active.     Note: This dictation was prepared with Dragon/digital dictation.  Any transcriptional errors that result are unintentional. Huel Cote [facility], 10:21 AM 09/29/2013

## 2013-10-07 ENCOUNTER — Ambulatory Visit
Admission: RE | Admit: 2013-10-07 | Discharge: 2013-10-07 | Disposition: A | Discharge status: Home / Self Care | Payer: 59 | Source: Ambulatory Visit | Attending: Women's Health | Admitting: Women's Health

## 2013-10-07 DIAGNOSIS — Z1231 Encounter for screening mammogram for malignant neoplasm of breast: Secondary | ICD-10-CM

## 2013-10-13 ENCOUNTER — Other Ambulatory Visit: Payer: Self-pay | Admitting: Family Medicine

## 2013-10-13 DIAGNOSIS — F329 Major depressive disorder, single episode, unspecified: Secondary | ICD-10-CM

## 2013-10-13 DIAGNOSIS — F3289 Other specified depressive episodes: Secondary | ICD-10-CM

## 2013-10-13 MED ORDER — FLUOXETINE HCL 40 MG PO CAPS: 40.0000 mg | ORAL_CAPSULE | Freq: Every day | ORAL | Status: DC

## 2013-10-13 NOTE — Telephone Encounter (Signed)
At Tasha Pearson's last visit, we discussed her increasing her dosage of Prozac.  She did not feel that she needed to do it at that time.  She called the office today saying that she has changed her mind so she will increase her dosage to 40 mg.

## 2013-10-13 NOTE — Telephone Encounter (Signed)
Tasha Pearson just called to see if her Prozac could be increased, she said that her and Dr Dion Saucier had discussed this on her last visit

## 2013-10-18 ENCOUNTER — Other Ambulatory Visit: Payer: Self-pay | Admitting: Women's Health

## 2013-10-18 DIAGNOSIS — R928 Other abnormal and inconclusive findings on diagnostic imaging of breast: Secondary | ICD-10-CM

## 2013-10-26 ENCOUNTER — Other Ambulatory Visit: Payer: 59

## 2013-11-02 ENCOUNTER — Other Ambulatory Visit: Payer: Self-pay | Admitting: Family Medicine

## 2013-11-02 ENCOUNTER — Other Ambulatory Visit: Payer: Self-pay | Admitting: *Deleted

## 2013-11-02 ENCOUNTER — Encounter: Payer: Self-pay | Admitting: Family Medicine

## 2013-11-02 ENCOUNTER — Encounter: Payer: Self-pay | Admitting: *Deleted

## 2013-11-02 DIAGNOSIS — E785 Hyperlipidemia, unspecified: Secondary | ICD-10-CM

## 2013-11-02 MED ORDER — ROSUVASTATIN CALCIUM 40 MG PO TABS: 40.0000 mg | ORAL_TABLET | Freq: Every day | ORAL | Status: DC

## 2013-11-02 MED ORDER — ROSUVASTATIN CALCIUM 20 MG PO TABS: 20.0000 mg | ORAL_TABLET | Freq: Every day | ORAL | Status: DC

## 2013-11-04 ENCOUNTER — Ambulatory Visit
Admission: RE | Admit: 2013-11-04 | Discharge: 2013-11-04 | Disposition: A | Discharge status: Home / Self Care | Payer: 59 | Source: Ambulatory Visit | Attending: Women's Health | Admitting: Women's Health

## 2013-11-04 ENCOUNTER — Encounter: Payer: 59 | Attending: General Surgery | Admitting: Dietician

## 2013-11-04 VITALS — Ht 65.0 in | Wt 172.5 lb

## 2013-11-04 DIAGNOSIS — E663 Overweight: Secondary | ICD-10-CM

## 2013-11-04 DIAGNOSIS — R928 Other abnormal and inconclusive findings on diagnostic imaging of breast: Secondary | ICD-10-CM

## 2013-11-04 DIAGNOSIS — Z713 Dietary counseling and surveillance: Secondary | ICD-10-CM | POA: Insufficient documentation

## 2013-11-04 NOTE — Patient Instructions (Addendum)
-  1500 mg calcium citrate (500 mg 3x a day) -Limit carbs to 15 grams per meal (45-60 grams per day)  TANITA  BODY COMP RESULTS  05/11/13 07/05/2013 08/09/2013 11/04/13   BMI (kg/m^2) 41.5 36.8 34.3 28.7   Fat Mass (lbs) 131.5 108.5 90.5 67   Fat Free Mass (lbs) 118.0 112.5 115.5 105.5   Total Body Water (lbs) 86.5 82.5 84.5 77

## 2013-11-04 NOTE — Progress Notes (Signed)
.  Follow-up visit:  6 months Post-Operative Gastric sleeve Surgery  Medical Nutrition Therapy:  Appt start time: 0830 end time:  900.  Primary concerns today: Post-operative Bariatric Surgery Nutrition Management.   Tasha Pearson returns today reporting that she is feeling great! She is eating protein foods first and meeting her needs. She states that she is feeling anxious about entering the maintenance phase and fears weight regain.  Surgery date: 04/26/13 Surgery type: Gastric Sleeve  Starting weight at [facility] 282 lbs on 03/22/13 Weight today:172.5 lbs Weight change: 33.5 lbs Total weight lost: 78 lbs Goal weight: 150 lbs  TANITA  BODY COMP RESULTS  05/11/13 07/05/2013 08/09/2013 11/04/13   BMI (kg/m^2) 41.5 36.8 34.3 28.7   Fat Mass (lbs) 131.5 108.5 90.5 67   Fat Free Mass (lbs) 118.0 112.5 115.5 105.5   Total Body Water (lbs) 86.5 82.5 84.5 77    Preferred Learning Style:  No preference indicated   Learning Readiness:  Ready  Change in progress   24-hr recall: B (AM): protein shake (15g) Snk (AM): yogurt (12-15g) OR cottage cheese (7-15g) L (PM): salad or vegetables with 2-4 oz shrimp or chicken (14-21g) Snk (PM): vegetables with cheese or cottage cheese (7-14 g)  D (PM): fish or meatballs (14g) Snk (PM): SF popsicles  Fluid intake: 64+ oz per pt estimation; protein shakes, Vitamin water zero, water Estimated total protein intake: 60-70 grams  Medications: see list Supplementation: taking  Using straws: no Drinking while eating: no Hair loss: yes (taking Biotin) Carbonated beverages: no N/V/D/C: some gassiness Dumping syndrome: no  Recent physical activity:  Riding bike and elliptical; rowing and weights 3-4x a week  Progress Towards Goal(s):  In progress.  Handouts given during visit include:  Support group dates   Nutritional Diagnosis:  Tasha Pearson-3.3 Overweight/obesity related to past poor dietary habits and physical inactivity as evidenced by patient w/ recent  Gastric sleeve surgery following dietary guidelines for continued weight loss.    Intervention:  Nutrition counseling provided.  Teaching Method Utilized:  Visual Auditory Hands on  Barriers to learning/adherence to lifestyle change: work schedule  Demonstrated degree of understanding via:  Teach Back   Monitoring/Evaluation:  Dietary intake, exercise, and body weight. Follow up in 4 months for 10 month post-op visit.

## 2013-11-24 ENCOUNTER — Ambulatory Visit (INDEPENDENT_AMBULATORY_CARE_PROVIDER_SITE_OTHER): Payer: Commercial Managed Care - PPO | Admitting: General Surgery

## 2013-11-24 ENCOUNTER — Encounter (INDEPENDENT_AMBULATORY_CARE_PROVIDER_SITE_OTHER): Payer: Self-pay | Admitting: General Surgery

## 2013-11-24 ENCOUNTER — Telehealth (INDEPENDENT_AMBULATORY_CARE_PROVIDER_SITE_OTHER): Payer: Self-pay

## 2013-11-24 VITALS — BP 130/70 | HR 63 | Temp 98.0°F | Ht 65.0 in | Wt 170.0 lb

## 2013-11-24 DIAGNOSIS — Z9884 Bariatric surgery status: Secondary | ICD-10-CM

## 2013-11-24 MED ORDER — PANTOPRAZOLE SODIUM 40 MG PO TBEC: 40.0000 mg | DELAYED_RELEASE_TABLET | Freq: Every day | ORAL | Status: DC

## 2013-11-24 NOTE — Progress Notes (Signed)
Chief complaint: Followup sleeve gastrectomy  History: Patient returned to the office now 7 months following laparoscopic sleeve gastrectomy for morbid obesity and comorbidities of joint and foot pain. She continues to do extremely well. Her food intolerance has improved since her initial visit and she is not having any episodes of nausea or vomiting. She does continue to fill up very quickly and gets pickups if she gets to full. She continues to have some symptomatic reflux about twice per week even on Prilosec. No regurgitation. Some of the vitamin preparations are difficult to get down and she is still experimenting with these. She is however thrilled with her weight loss. Her joint pain is much improved although she continues to have left foot pain. Her energy level is excellent. She is extremely happy that she went through with the surgery.  Exam: BP 130/70  Pulse 63  Temp(Src) 98 F (36.7 C)  Ht 5\' 5"  (1.651 m)  Wt 170 lb (77.111 kg)  BMI 28.29 kg/m2 Total weight loss 106 pounds, 65 pounds since last visit  General: Appears well Skin: No rash or infection Lungs: Clear breath sounds bilaterally Abdomen: Soft and nontender. No hernias. Extremities: No edema  Assessment and plan: Doing very well following sleeve gastrectomy with excellent weight loss. She does have some moderate reflux not well controlled on Prilosec. We will try switching her to Protonix. I don't see any issues with her diet or exercise regimen. Recent CBC and chemistries and iron levels are reviewed which are okay. She states that vitamin levels were also checked and we will get these from her primary. She will return at her one year followup 5 months.

## 2013-11-24 NOTE — Telephone Encounter (Signed)
LMOM> Dr Imelda Pillow did not draw vit labs. Dr Excell Seltzer put orders in for [facility]. She can go when convenient for her.

## 2013-11-24 NOTE — Addendum Note (Signed)
Addended by: Carlene Coria on: 11/24/2013 12:37 PM   Modules accepted: Orders

## 2013-11-28 DIAGNOSIS — F411 Generalized anxiety disorder: Secondary | ICD-10-CM | POA: Insufficient documentation

## 2013-11-28 DIAGNOSIS — K219 Gastro-esophageal reflux disease without esophagitis: Secondary | ICD-10-CM | POA: Insufficient documentation

## 2013-11-28 DIAGNOSIS — E559 Vitamin D deficiency, unspecified: Secondary | ICD-10-CM | POA: Insufficient documentation

## 2013-11-28 DIAGNOSIS — M545 Low back pain, unspecified: Secondary | ICD-10-CM | POA: Insufficient documentation

## 2013-11-28 DIAGNOSIS — E038 Other specified hypothyroidism: Secondary | ICD-10-CM | POA: Insufficient documentation

## 2013-11-28 DIAGNOSIS — K5900 Constipation, unspecified: Secondary | ICD-10-CM | POA: Insufficient documentation

## 2013-11-28 DIAGNOSIS — D509 Iron deficiency anemia, unspecified: Secondary | ICD-10-CM | POA: Insufficient documentation

## 2013-11-28 DIAGNOSIS — E785 Hyperlipidemia, unspecified: Secondary | ICD-10-CM | POA: Insufficient documentation

## 2013-11-28 MED ORDER — CRESTOR 40 MG PO TABS: 40.0000 mg | ORAL_TABLET | Freq: Every day | ORAL | Status: DC

## 2014-02-28 ENCOUNTER — Encounter (INDEPENDENT_AMBULATORY_CARE_PROVIDER_SITE_OTHER): Payer: Self-pay | Admitting: General Surgery

## 2014-03-07 ENCOUNTER — Encounter: Payer: 59 | Attending: General Surgery | Admitting: Dietician

## 2014-03-07 DIAGNOSIS — Z713 Dietary counseling and surveillance: Secondary | ICD-10-CM | POA: Insufficient documentation

## 2014-03-07 DIAGNOSIS — Z6824 Body mass index (BMI) 24.0-24.9, adult: Secondary | ICD-10-CM | POA: Diagnosis not present

## 2014-03-07 DIAGNOSIS — Z9884 Bariatric surgery status: Secondary | ICD-10-CM | POA: Diagnosis not present

## 2014-03-07 NOTE — Progress Notes (Signed)
.  Follow-up visit:  10 months Post-Operative Gastric sleeve Surgery  Medical Nutrition Therapy:  Appt start time: 0800 end time:  830  Primary concerns today: Post-operative Bariatric Surgery Nutrition Management.   Tasha Pearson returns today reporting that she is feeling great and has surpassed her weight loss goal of 150 lbs. She reports she is "thinner than she has ever been." However, her goal is still 150 lbs and she continues to lose weight. Tasha Pearson reports that she does not really count calories. Still unable to eat much at one time but eats frequently throughout the day. She would like to discuss strategies to maintain her weight but states she does not want to add bread or sugar. Weighs herself 1x a week.   Surgery date: 04/26/13 Surgery type: Gastric Sleeve  Starting weight at [facility] 282 lbs on 03/22/13 Weight today:147.5 lbs Weight change: 33.5 lbs Total weight lost: 139 lbs Goal weight: 150 lbs  TANITA  BODY COMP RESULTS  05/11/13 07/05/2013 08/09/2013 11/04/13 03/07/14   BMI (kg/m^2) 41.5 36.8 34.3 28.7 24.5   Fat Mass (lbs) 131.5 108.5 90.5 67 42.5   Fat Free Mass (lbs) 118.0 112.5 115.5 105.5 105   Total Body Water (lbs) 86.5 82.5 84.5 77 77    Preferred Learning Style:  No preference indicated   Learning Readiness:  Change in progress   24-hr recall: B (AM): Quest bar (20g) OR oatmeal with chia seeds  Snk (AM): cheese and pretzel crisps and part of an apple L (PM): quinoa frittata with fruit and pretzel crisps or crackers Snk (PM): triple zero yogurt or frozen yogurt bites with blueberries or cheese D (PM): protein cereal or chicken and vegetables Snk (PM):   Fluid intake: 64+ oz per pt estimation; protein shake occasionally, Vitamin water zero, water, occasional glass of wine Estimated total protein intake: 60+ grams  Medications: see list Supplementation: taking  Using straws: no Drinking while eating: no Hair loss: yes (taking Biotin) Carbonated beverages:  no N/V/D/C: some gassiness with certain foods Dumping syndrome: no  Recent physical activity:  Hasn't been to gym lately; 10,000 steps a day every day  Progress Towards Goal(s):  In progress.  Nutritional Diagnosis:  [place]-3.3 Overweight/obesity related to past poor dietary habits and physical inactivity as evidenced by patient w/ recent Gastric sleeve surgery following dietary guidelines for continued weight loss.    Intervention:  Nutrition counseling provided. Discussed adding calories from healthy food sources. Encouraged patient to enjoy her food and work to maintain healthy attitudes regarding weight and eating.  Goals: -Increase healthy fats: nuts and seeds, nut butters, 2% [place] yogurt, salmon, avocado, olive oil -Add more high-fiber carbs: beans, sweet potatoes, butternut squash, quinoa -Continue to limit sugar -Continue weighing yourself 1x a week  Teaching Method Utilized:  Visual Auditory Hands on  Barriers to learning/adherence to lifestyle change: work schedule  Demonstrated degree of understanding via:  Teach Back   Monitoring/Evaluation:  Dietary intake, exercise, and body weight. Follow up in 3 months for 13 month post-op visit. Encouraged patient to call or email with any questions prior to appointment.

## 2014-03-07 NOTE — Patient Instructions (Addendum)
-  Increase healthy fats: nuts and seeds, nut butters, 2% [place] yogurt, salmon, avocado, olive oil -Add more carbs: beans, sweet potatoes, butternut squash, quinoa -Continue to limit sugar -Continue weighing yourself 1x a week   TANITA  BODY COMP RESULTS  05/11/13 07/05/2013 08/09/2013 11/04/13 03/07/14   BMI (kg/m^2) 41.5 36.8 34.3 28.7 24.5   Fat Mass (lbs) 131.5 108.5 90.5 67 42.5   Fat Free Mass (lbs) 118.0 112.5 115.5 105.5 105   Total Body Water (lbs) 86.5 82.5 84.5 77 77

## 2014-05-31 ENCOUNTER — Ambulatory Visit: Payer: 59 | Admitting: Dietician

## 2014-06-23 ENCOUNTER — Other Ambulatory Visit (INDEPENDENT_AMBULATORY_CARE_PROVIDER_SITE_OTHER): Payer: Self-pay

## 2014-06-23 DIAGNOSIS — R1110 Vomiting, unspecified: Secondary | ICD-10-CM

## 2014-06-23 DIAGNOSIS — Z9884 Bariatric surgery status: Secondary | ICD-10-CM

## 2014-06-23 DIAGNOSIS — R112 Nausea with vomiting, unspecified: Secondary | ICD-10-CM

## 2014-06-23 DIAGNOSIS — K219 Gastro-esophageal reflux disease without esophagitis: Secondary | ICD-10-CM

## 2014-06-23 DIAGNOSIS — IMO0001 Reserved for inherently not codable concepts without codable children: Secondary | ICD-10-CM

## 2014-06-28 ENCOUNTER — Ambulatory Visit
Admission: RE | Admit: 2014-06-28 | Discharge: 2014-06-28 | Disposition: A | Discharge status: Home / Self Care | Payer: 59 | Source: Ambulatory Visit | Attending: General Surgery | Admitting: General Surgery

## 2014-06-29 ENCOUNTER — Telehealth: Payer: Self-pay

## 2014-06-29 NOTE — Telephone Encounter (Signed)
Left message for patient to call back  

## 2014-06-29 NOTE — Telephone Encounter (Signed)
-----   Message from [name], MD sent at 06/29/2014  9:38 AM EST ----- Dr. Excell Seltzer called to have this patient seen for GERD and dysphagia. Please help her get appt with me soon or with an APP soon when I am supervising MD. Corrin Parker.

## 2014-06-30 NOTE — Telephone Encounter (Signed)
Patient is scheduled with Dr. Fuller Plan on 07/14/14 11:00

## 2014-07-14 ENCOUNTER — Ambulatory Visit (INDEPENDENT_AMBULATORY_CARE_PROVIDER_SITE_OTHER): Payer: 59 | Admitting: Gastroenterology

## 2014-07-14 ENCOUNTER — Encounter: Payer: Self-pay | Admitting: Gastroenterology

## 2014-07-14 VITALS — BP 110/70 | HR 70 | Ht 64.0 in | Wt 145.4 lb

## 2014-07-14 DIAGNOSIS — Z9884 Bariatric surgery status: Secondary | ICD-10-CM

## 2014-07-14 DIAGNOSIS — K219 Gastro-esophageal reflux disease without esophagitis: Secondary | ICD-10-CM

## 2014-07-14 MED ORDER — PANTOPRAZOLE SODIUM 40 MG PO TBEC: 40.0000 mg | DELAYED_RELEASE_TABLET | Freq: Two times a day (BID) | ORAL | Status: DC

## 2014-07-14 NOTE — Patient Instructions (Signed)
You have been scheduled for an endoscopy. Please follow written instructions given to you at your visit today. If you use inhalers (even only as needed), please bring them with you on the day of your procedure. Your physician has requested that you go to www.startemmi.com and enter the access code given to you at your visit today. This web site gives a general overview about your procedure. However, you should still follow specific instructions given to you by our office regarding your preparation for the procedure.  Increase your pantoprazole to twice daily. A new prescription has been sent to your pharmacy.   Patient advised to avoid spicy, acidic, citrus, chocolate, mints, fruit and fruit juices.  Limit the intake of caffeine, alcohol and Soda.  Don't exercise too soon after eating.  Don't lie down within 3-4 hours of eating.  Elevate the head of your bed.  Thank you for choosing me and [facility].  Pricilla Riffle. Dagoberto Ligas., MD., Marval Regal  cc: Excell Seltzer, MD

## 2014-07-14 NOTE — Progress Notes (Signed)
History of Present Illness: This is a 54 year old female referred to me by Excell Seltzer, MD for the evaluation of reflux symptoms and early satiety. Patient is status post a laparoscopic sleeve gastrectomy and December 2014. Her BMI has decreased to 24. Her weight has been stable for the past several months. Over the past 2-3 months she has frequent episodes of early satiety, regurgitation, epigastric/chest fullness and heartburn. Pantoprazole once daily has helped her symptoms however she takes pantoprazole twice daily on occasion. Upper GI series showed a small hiatal hernia and severe reflux. No stricture or obstruction was noted. She states she's undergone colonoscopies by Dr. Vira Agar in [place]. Last colonoscopy was in about 2008 or 2009. Polyps have been found in the past. She is unsure if they are precancerous. She has a history of IBS and recently her symptoms have not been active. She has been following a high fiber diet for management of mild constipation and IBS. Denies weight loss, diarrhea, change in stool caliber, melena, hematochezia, nausea, vomiting, dysphagia.   Allergies  Allergen Reactions  . Penicillins Rash   Outpatient Prescriptions Prior to Visit  Medication Sig Dispense Refill  . acetaminophen (TYLENOL) 500 MG tablet Take 500 mg by mouth every 6 (six) hours as needed for pain.    Marland Kitchen buPROPion (WELLBUTRIN XL) 300 MG 24 hr tablet Take 1 tablet (300 mg total) by mouth every morning. 90 tablet 1  . CRESTOR 40 MG tablet Take 1 tablet (40 mg total) by mouth daily. 90 tablet 1  . cyclobenzaprine (FLEXERIL) 10 MG tablet Take 1 tablet (10 mg total) by mouth at bedtime. 90 tablet 1  . FLUoxetine (PROZAC) 40 MG capsule Take 1 capsule (40 mg total) by mouth daily. 90 capsule 1  . pantoprazole (PROTONIX) 40 MG tablet Take 1 tablet (40 mg total) by mouth daily. 30 tablet 3  . Vitamin D, Ergocalciferol, (DRISDOL) 50000 UNITS CAPS capsule Take 1 capsule (50,000 Units total) by  mouth every 7 (seven) days. 12 capsule 4  . levothyroxine (SYNTHROID, LEVOTHROID) 125 MCG tablet Take 1 tablet (125 mcg total) by mouth daily before breakfast. 90 tablet 3  . rosuvastatin (CRESTOR) 20 MG tablet Take 1 tablet (20 mg total) by mouth daily. 30 tablet 0   No facility-administered medications prior to visit.   Past Medical History  Diagnosis Date  . Morbid obesity   . IBS (irritable bowel syndrome)     Pt reported on 01/18/13  . Hypercholesteremia   . Hypothyroidism   . H/O Graves' disease   . Depression   . Pneumonia 20's    hx of  . Headache(784.0)     rare  . Arthritis     feet  . PONV (postoperative nausea and vomiting)     severe vomiting  . Schwannoma     left ear   Past Surgical History  Procedure Laterality Date  . Cesarean section      x2  . Cholecystectomy    . Abdominal hysterectomy    . Spine surgery      fusion, nerve release, and replaced a disc in lower back, l5-s1  . Laparoscopic gastric sleeve resection N/A 04/26/2013    Procedure: LAPAROSCOPIC GASTRIC SLEEVE RESECTION;  Surgeon: Edward Jolly, MD;  Location: [facility];  Service: General;  Laterality: N/A;  . Upper gi endoscopy N/A 04/26/2013    Procedure: UPPER GI ENDOSCOPY;  Surgeon: Edward Jolly, MD;  Location: [facility];  Service: General;  Laterality:  N/A;   History   Social History  . Marital Status: Single    Spouse Name: N/A  . Number of Children: N/A  . Years of Education: N/A   Social History Main Topics  . Smoking status: Former Smoker -- 10 years    Types: Cigarettes    Quit date: 04/29/2008  . Smokeless tobacco: Never Used  . Alcohol Use: No     Comment: occasional glass of wine  . Drug Use: No  . Sexual Activity: Not Currently   Other Topics Concern  . 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 She quit smoking in 01/2010.     Alcohol Use:  None   Drug Use:  None   Diet:  Regular   Exercise:  None   Hobbies: Cooking/ Scrapbooking/ Gardening   Family History  Problem Relation Age of Onset  . Pancreatic cancer Mother   . Heart disease Father   . Diabetes Brother       Review of Systems: Pertinent positive and negative review of systems were noted in the above HPI section. All other review of systems were otherwise negative.   Physical Exam: General: Well developed, well nourished, no acute distress Head: Normocephalic and atraumatic Eyes:  sclerae anicteric, EOMI Ears: Normal auditory acuity Mouth: No deformity or lesions Neck: Supple, no masses or thyromegaly Lungs: Clear throughout to auscultation Heart: Regular rate and rhythm; no murmurs, rubs or bruits Abdomen: Soft, non tender and non distended. No masses, hepatosplenomegaly or hernias noted. Normal Bowel sounds Musculoskeletal: Symmetrical with no gross deformities  Skin: No lesions on visible extremities Pulses:  Normal pulses noted Extremities: No clubbing, cyanosis, edema or deformities noted Neurological: Alert oriented x 4, grossly nonfocal Cervical Nodes:  No significant cervical adenopathy Inguinal Nodes: No significant inguinal adenopathy Psychological:  Alert and cooperative. Normal mood and affect  Assessment and Recommendations:  1. GERD, early satiety, regurgitation, chest/epigastric fullness. S/P laparoscopic sleeve gastrectomy in 03/2013. Rule out esophagitis, ulcer disease, gastroparesis. She does not exhibit true dysphagia. Increase pantoprazole to 40 mg twice daily. Follow all standard antireflux measures. Decrease fiber intake as this may be contributing to early satiety and exacerbate her symptoms of fullness. Schedule EGD. The risks (including bleeding, perforation, infection, missed  lesions, medication reactions and possible hospitalization or surgery if complications occur), benefits, and alternatives to endoscopy with possible biopsy and possible dilation were discussed with the patient and they consent to proceed.   2. IBS. Currently symptoms are inactive.  3. Personal history of colon polyps. Attempt to obtain records from Dr. Vira Agar in [place] to determine the appropriate interval for her next colonoscopy.   cc: Excell Seltzer, MD [address] [place], [place] [id number]

## 2014-07-15 ENCOUNTER — Encounter: Payer: Self-pay | Admitting: Gastroenterology

## 2014-07-15 ENCOUNTER — Ambulatory Visit (AMBULATORY_SURGERY_CENTER): Payer: 59 | Admitting: Gastroenterology

## 2014-07-15 VITALS — BP 146/96 | HR 65 | Temp 97.8°F | Resp 11 | Ht 64.0 in | Wt 145.0 lb

## 2014-07-15 DIAGNOSIS — R6881 Early satiety: Secondary | ICD-10-CM

## 2014-07-15 DIAGNOSIS — Z9884 Bariatric surgery status: Secondary | ICD-10-CM

## 2014-07-15 DIAGNOSIS — K219 Gastro-esophageal reflux disease without esophagitis: Secondary | ICD-10-CM

## 2014-07-15 MED ORDER — SODIUM CHLORIDE 0.9 % IV SOLN: 500.0000 mL | INTRAVENOUS | Status: DC

## 2014-07-15 NOTE — Op Note (Signed)
[facility]ction  [address]. [place], [id number]   ENDOSCOPY PROCEDURE REPORT  PATIENT: Tasha Pearson, Tasha Pearson  MR#: [id number] BIRTHDATE: February 17, 1961 , 53  yrs. old GENDER: female ENDOSCOPIST: [name], MD, [facility] REFERRED BY:  Excell Seltzer, M.D. PROCEDURE DATE:  07/15/2014 PROCEDURE:  EGD, diagnostic ASA CLASS:     Class II INDICATIONS:  history of esophageal reflux and early satiety. MEDICATIONS: Monitored anesthesia care, Propofol 140 mg IV, and lidcoaine 40 mg IV TOPICAL ANESTHETIC: none DESCRIPTION OF PROCEDURE: After the risks benefits and alternatives of the procedure were thoroughly explained, informed consent was obtained.  The LB [id number] endoscope was introduced through the mouth and advanced to the second portion of the duodenum , Without limitations.  The instrument was slowly withdrawn as the mucosa was fully examined.    ESOPHAGUS: The mucosa of the esophagus appeared normal. STOMACH: Gastric changes c/w prior gastric sleeve procedure.   The stomach otherwise appeared normal. DUODENUM: The duodenal mucosa showed no abnormalities.  Retroflexed views revealed a 5 cm hiatal hernia.   The scope was then withdrawn from the patient and the procedure completed.  COMPLICATIONS: There were no immediate complications.  ENDOSCOPIC IMPRESSION: 1.   Gastric changes c/w prior gastric sleeve procedure 2.   5 cm hiatal hernia 4.   The EGD otherwise showed no abnormalities  RECOMMENDATIONS: 1.  Anti-reflux regimen 2.  Continue PPI bid long term 3.  Office follow up in 2 months  eSigned:  [name], MD, [facility] 07/15/2014 10:58 AM

## 2014-07-15 NOTE — Progress Notes (Signed)
Stable to RR 

## 2014-07-15 NOTE — Patient Instructions (Signed)
YOU HAD AN ENDOSCOPIC PROCEDURE TODAY AT [facility]:   Refer to the procedure report that was given to you for any specific questions about what was found during the examination.  If the procedure report does not answer your questions, please call your gastroenterologist to clarify.  If you requested that your care partner not be given the details of your procedure findings, then the procedure report has been included in a sealed envelope for you to review at your convenience later.  YOU SHOULD EXPECT: Some feelings of bloating in the abdomen. Passage of more gas than usual.  Walking can help get rid of the air that was put into your GI tract during the procedure and reduce the bloating. If you had a lower endoscopy (such as a colonoscopy or flexible sigmoidoscopy) you may notice spotting of blood in your stool or on the toilet paper. If you underwent a bowel prep for your procedure, you may not have a normal bowel movement for a few days.  Please Note:  You might notice some irritation and congestion in your nose or some drainage.  This is from the oxygen used during your procedure.  There is no need for concern and it should clear up in a day or so.  SYMPTOMS TO REPORT IMMEDIATELY:   Following upper endoscopy (EGD)  Vomiting of blood or coffee ground material  New chest pain or pain under the shoulder blades  Painful or persistently difficult swallowing  New shortness of breath  Fever of 100F or higher  Black, tarry-looking stools  For urgent or emergent issues, a gastroenterologist can be reached at any hour by calling [phone].   DIET: Your first meal following the procedure should be a small meal and then it is ok to progress to your normal diet. Heavy or fried foods are harder to digest and may make you feel nauseous or bloated.  Likewise, meals heavy in dairy and vegetables can increase bloating.  Drink plenty of fluids but you should avoid alcoholic beverages for  24 hours.  ACTIVITY:  You should plan to take it easy for the rest of today and you should NOT DRIVE or use heavy machinery until tomorrow (because of the sedation medicines used during the test).    FOLLOW UP: Our staff will call the number listed on your records the next business day following your procedure to check on you and address any questions or concerns that you may have regarding the information given to you following your procedure. If we do not reach you, we will leave a message.  However, if you are feeling well and you are not experiencing any problems, there is no need to return our call.  We will assume that you have returned to your regular daily activities without incident.  If any biopsies were taken you will be contacted by phone or by letter within the next 1-3 weeks.  Please call us at [phone] if you have not heard about the biopsies in 3 weeks.    SIGNATURES/CONFIDENTIALITY: You and/or your care partner have signed paperwork which will be entered into your electronic medical record.  These signatures attest to the fact that that the information above on your After Visit Summary has been reviewed and is understood.  Full responsibility of the confidentiality of this discharge information lies with you and/or your care-partner.  Hiatal hernia-handout given  Office follow-up in 2 months-office will call with appointment.

## 2014-07-18 ENCOUNTER — Telehealth: Payer: Self-pay | Admitting: *Deleted

## 2014-07-18 NOTE — Telephone Encounter (Signed)
  Follow up Call-  Call back number 07/15/2014  Post procedure Call Back phone  # [phone]  Permission to leave phone message Yes     No answer, left message.

## 2014-08-04 ENCOUNTER — Telehealth: Payer: Self-pay

## 2014-08-04 NOTE — Telephone Encounter (Signed)
Left a message for patient to return my call. 

## 2014-08-04 NOTE — Telephone Encounter (Signed)
-----   Message from [name], MD sent at 08/03/2014  4:50 PM EDT ----- Please notify the patient that we received colonoscopy and path records from Dr. Vira Agar. Colonoscopy performed August 2005 with 2 hyperplastic colon polyps removed. She is overdue for a 10 year screening colonoscopy. Recommend her to schedule colonoscopy now unless there is another colonoscopy performed since 2005 that we do not have.

## 2014-08-08 NOTE — Telephone Encounter (Signed)
Pt said she is returning Amanda's call

## 2014-08-08 NOTE — Telephone Encounter (Signed)
Called patient back. She was unable to come to the phone as she was in a case. We will attempt to call back at a later time.

## 2014-08-10 NOTE — Telephone Encounter (Signed)
Informed patient of Dr. Lynne Leader recommendations. Patient states she will call back later this afternoon to schedule her Colonoscopy and nurse visit.

## 2015-04-04 ENCOUNTER — Other Ambulatory Visit (HOSPITAL_COMMUNITY): Payer: Self-pay | Admitting: Neurosurgery

## 2015-04-04 DIAGNOSIS — M5412 Radiculopathy, cervical region: Secondary | ICD-10-CM

## 2015-04-19 ENCOUNTER — Ambulatory Visit (HOSPITAL_COMMUNITY)
Admission: RE | Admit: 2015-04-19 | Discharge: 2015-04-19 | Disposition: A | Discharge status: Home / Self Care | Payer: 59 | Source: Ambulatory Visit | Attending: Neurosurgery | Admitting: Neurosurgery

## 2015-04-19 DIAGNOSIS — R200 Anesthesia of skin: Secondary | ICD-10-CM | POA: Diagnosis not present

## 2015-04-19 DIAGNOSIS — M50123 Cervical disc disorder at C6-C7 level with radiculopathy: Secondary | ICD-10-CM | POA: Insufficient documentation

## 2015-04-19 DIAGNOSIS — E041 Nontoxic single thyroid nodule: Secondary | ICD-10-CM | POA: Diagnosis not present

## 2015-04-19 DIAGNOSIS — M47892 Other spondylosis, cervical region: Secondary | ICD-10-CM | POA: Insufficient documentation

## 2015-04-19 DIAGNOSIS — M5412 Radiculopathy, cervical region: Secondary | ICD-10-CM | POA: Diagnosis present

## 2015-04-19 DIAGNOSIS — M4802 Spinal stenosis, cervical region: Secondary | ICD-10-CM | POA: Diagnosis not present

## 2015-04-19 DIAGNOSIS — M79601 Pain in right arm: Secondary | ICD-10-CM | POA: Diagnosis not present

## 2015-05-25 ENCOUNTER — Ambulatory Visit: Payer: 59 | Attending: Neurosurgery | Admitting: Physical Therapy

## 2015-05-25 DIAGNOSIS — R209 Unspecified disturbances of skin sensation: Secondary | ICD-10-CM

## 2015-05-25 DIAGNOSIS — M6289 Other specified disorders of muscle: Secondary | ICD-10-CM

## 2015-05-25 DIAGNOSIS — R208 Other disturbances of skin sensation: Secondary | ICD-10-CM | POA: Insufficient documentation

## 2015-05-25 DIAGNOSIS — R29898 Other symptoms and signs involving the musculoskeletal system: Secondary | ICD-10-CM | POA: Diagnosis not present

## 2015-05-25 DIAGNOSIS — G729 Myopathy, unspecified: Secondary | ICD-10-CM | POA: Diagnosis not present

## 2015-05-25 DIAGNOSIS — M542 Cervicalgia: Secondary | ICD-10-CM | POA: Insufficient documentation

## 2015-05-25 DIAGNOSIS — M5010 Cervical disc disorder with radiculopathy, unspecified cervical region: Secondary | ICD-10-CM | POA: Diagnosis not present

## 2015-05-25 NOTE — Therapy (Signed)
[place], [place], [address] Phone: [phone]   Fax:  [phone]  Physical Therapy Evaluation  Patient Details  Name: Tasha Pearson MRN: [id number] Date of Birth: [birth year] Referring Provider: Vertell Limber  Encounter Date: 05/25/2015      PT End of Session - 05/25/15 1605    Visit Number 1   Number of Visits 16   Date for PT Re-Evaluation 07/20/15   PT Start Time 1500   PT Stop Time 1600   PT Time Calculation (min) 60 min   Activity Tolerance Patient tolerated treatment well   Behavior During Therapy [facility] for tasks assessed/performed      Past Medical History  Diagnosis Date  . Morbid obesity   . IBS (irritable bowel syndrome)     Pt reported on 01/18/13  . Hypercholesteremia   . Hypothyroidism   . H/O Graves' disease   . Depression   . Pneumonia 20's    hx of  . Headache(784.0)     rare  . Arthritis     feet  . PONV (postoperative nausea and vomiting)     severe vomiting  . Schwannoma     left ear    Past Surgical History  Procedure Laterality Date  . Cesarean section      x2  . Cholecystectomy    . Abdominal hysterectomy    . Spine surgery      fusion, nerve release, and replaced a disc in lower back, l5-s1  . Laparoscopic gastric sleeve resection N/A 04/26/2013    Procedure: LAPAROSCOPIC GASTRIC SLEEVE RESECTION;  Surgeon: Edward Jolly, MD;  Location: [facility];  Service: General;  Laterality: N/A;  . Upper gi endoscopy N/A 04/26/2013    Procedure: UPPER GI ENDOSCOPY;  Surgeon: Edward Jolly, MD;  Location: [facility];  Service: General;  Laterality: N/A;  . Liver surgery      There were no vitals filed for this visit.  Visit Diagnosis:  Cervicalgia  Muscle tightness  Sensory disturbance  Cervical disc disorder with radiculopathy of cervical region  Weakness of right arm      Subjective Assessment - 05/25/15 1508    Subjective Patient describes onset of neck and  arm pain Rt. > L and sensory changes worsening for the past 6 mos.  She reports weakness, muscle pain and stiffness, radiating pain and numbness.  Her ability to work, sleeping, lift and hold items is impacted.     Pertinent History Lumbar fusion, gastric sleeve/weight loss   Limitations Reading;Lifting;Sitting;Other (comment)  sleeping   How long can you sit comfortably? changes positions alot    How long can you stand comfortably? not affected   How long can you walk comfortably? not affected   Diagnostic tests MRI with Central disc protrusion C6-C7 and stenosis   Currently in Pain? Yes   Pain Score 6    Pain Location Neck   Pain Orientation Right   Pain Descriptors / Indicators Tightness;Sore;Aching;Numbness   Pain Type Chronic pain   Pain Radiating Towards shoulder, down Rt. arm and also L    Pain Onset More than a month ago   Pain Frequency Constant   Aggravating Factors  prolonged sitting, turning head   Pain Relieving Factors heat, mostly only OTC    Effect of Pain on Daily Activities life is uncomfortbale   Multiple Pain Sites No            [facility] PT Assessment - 05/25/15 1517  Assessment   Medical Diagnosis cervicalgia   Referring Provider Vertell Limber   Onset Date/Surgical Date --  6 mos   Hand Dominance Right   Prior Therapy for Lumbar   Precautions   Precautions None   Restrictions   Weight Bearing Restrictions No   Balance Screen   Has the patient fallen in the past 6 months No   [place] residence   Prior Function   Level of Independence Independent   Cognition   Overall Cognitive Status Within Functional Limits for tasks assessed   Observation/Other Assessments   Focus on Therapeutic Outcomes (FOTO)  48%   Sensation   Light Touch Impaired by gross assessment   Additional Comments Decreased sensation Rt. UE   Coordination   Gross Motor Movements are Fluid and Coordinated Not tested   Posture/Postural Control    Posture/Postural Control Postural limitations   Postural Limitations Rounded Shoulders;Forward head   AROM   AROM Assessment Site --  pain throughout   Cervical Flexion 40   Cervical Extension 35   Cervical - Right Side Bend 30  pain and incr numb   Cervical - Left Side Bend 30   Cervical - Right Rotation 42   Cervical - Left Rotation 48   Strength   Right/Left Shoulder --  pain with MMT   Right Shoulder Flexion 4+/5   Right Shoulder ABduction 4/5   Left Shoulder Flexion 5/5   Left Shoulder ABduction 5/5   Right Elbow Flexion 4+/5   Right Elbow Extension 4/5   Left Elbow Flexion 5/5   Left Elbow Extension 4/5   Special Tests    Special Tests --  [place] deferred due to MD note (was pos)   Distraction Test   Findngs Positive   Comment felt good but did progduce mild Rt. sided burning in arm                   [facility] Adult PT Treatment/Exercise - 05/25/15 1517    Self-Care   Posture neck and head alignment, muscel imbalance, muscle guarding   Heat/Ice Application and IFC   Neck Exercises: Supine   Neck Retraction 10 reps;5 secs   Neck Retraction Limitations HEP   Moist Heat Therapy   Number Minutes Moist Heat 15 Minutes   Moist Heat Location Cervical   Electrical Stimulation   Electrical Stimulation Location neck    Electrical Stimulation Action IFC   Electrical Stimulation Parameters to tol   less sensation on Rt. side   Electrical Stimulation Goals Pain                PT Education - 05/25/15 1604    Education provided Yes   Education Details posture, PT/POC, HEP, traction   Person(s) Educated Patient   Methods Explanation;Demonstration;Handout;Verbal cues   Comprehension Verbalized understanding;Returned demonstration          PT Short Term Goals - 05/25/15 1610    PT SHORT TERM GOAL #1   Title Pt will be able to perform HEP with I for basic Cervical stab and posture   Time 2   Period Weeks   Status New   PT SHORT TERM GOAL #2    Title Pt will be able to improve comfort/position of neck with sleeping, 15-25%    Time 4   Period Weeks   Status New   PT SHORT TERM GOAL #3   Title Pt will report 25% less symptoms in hands, arms (centralization of  pain) for better work performance   Time 4   Period Weeks   Status New   PT SHORT TERM GOAL #4   Title Pt will demo neck AROM with min pain in neck    Time 4   Period Weeks   Status New           PT Long Term Goals - 05/25/15 1613    PT LONG TERM GOAL #1   Title Pt will be I with concepts of posture, body mech and lifting to preent re-injury.    Time 8   Period Weeks   Status New   PT LONG TERM GOAL #2   Title Pt will be I with more advanced HEP    Time 8   Period Weeks   Status New   PT LONG TERM GOAL #3   Title Pt will demo 5/5 strength in bilateral UEs throughout   Baseline decr Rt. abd, triceps and ER    Time 8   Period Weeks   Status New   PT LONG TERM GOAL #4   Title Pt will be able to demo C-AROM [facility] and no pain   Time 8   Period Weeks   Status New   PT LONG TERM GOAL #5   Title Pt will report min, occasional sx in UEs for improved sleep and daily activities    Time 8   Period Weeks   Status New   Additional Long Term Goals   Additional Long Term Goals Yes   PT LONG TERM GOAL #6   Title Pt will score </=36% limited on FOTO to demo overall improvement   Time 8   Period Weeks   Status New               Plan - 05/25/15 1605    Clinical Impression Statement Patient presetns for mod complexity eval of cervical radiculopathy impacting function at home and work.  She has significant pain, mild weakness noted today, muscle tension.  She was unable to fully relax her head in supine and so I opted for IFC and heat (vs traction).  Sensory sx increase with cervical sidebending. I feel traction may help once she has less muscle guarding.     Pt will benefit from skilled therapeutic intervention in order to improve on the following deficits  Decreased range of motion;Increased fascial restricitons;Impaired UE functional use;Decreased activity tolerance;Pain;Impaired flexibility;Decreased mobility;Decreased strength;Impaired sensation;Postural dysfunction   Rehab Potential Good   PT Frequency 2x / week   PT Duration 8 weeks   PT Treatment/Interventions ADLs/Self Care Home Management;Ultrasound;Neuromuscular re-education;Cryotherapy;Electrical Stimulation;Moist Heat;Traction;Therapeutic exercise;Manual techniques;Therapeutic activities;Taping;Dry needling;Patient/family education;Passive range of motion   PT Next Visit Plan questions [id number], assess IFC, chin tuck progression, consider traction/manual and or mechanical   PT Home Exercise Plan chin tuck, posture   Consulted and Agree with Plan of Care Patient         Problem List Patient Active Problem List   Diagnosis Date Noted  . Iron deficiency anemia 11/28/2013  . Anxiety state, unspecified 11/28/2013  . Other specified acquired hypothyroidism 11/28/2013  . Gastroesophageal reflux disease without esophagitis 11/28/2013  . Bilateral low back pain without sciatica 11/28/2013  . Other and unspecified hyperlipidemia 11/28/2013  . Unspecified constipation 11/28/2013  . Unspecified vitamin D deficiency 11/28/2013  . Labial infection 09/03/2013  . Candidiasis of vulva 09/03/2013  . Morbid obesity with BMI of 45.0-49.9, adult ([place]) 04/26/2013  . Unspecified hypothyroidism 04/11/2013  . Morbid obesity ([place]) 01/01/2013  .  GRAVE'S DISEASE 01/30/2007  . HYPERLIPIDEMIA 01/30/2007  . OBESITY NOS 01/30/2007  . TOBACCO USE 01/30/2007  . SYMPTOM, DISTURBANCE, SLEEP NOS 01/30/2007  . HYPERGLYCEMIA 01/30/2007  . DIABETES MELLITUS, TYPE II 01/09/2007  . ANEMIA-NOS 01/09/2007  . DEPRESSION 01/09/2007  . COLONIC POLYPS, HX OF 01/09/2007    Missie Gehrig 05/25/2015, 4:22 PM  [facility] [address] [place], [place],  [address] Phone: [phone]   Fax:  [phone]  Name: Tasha Pearson MRN: [id number] Date of Birth: 07-26-1960   Raeford Razor, PT 05/25/2015 4:22 PM Phone: [phone] Fax: [phone]

## 2015-05-25 NOTE — Patient Instructions (Signed)
Flexion: Cervical OA - Supine    Lie with bolster under mid neck, fingertips under chin. Pull chin in then lower it, rolling head forward on neck. Do not lift neck off bolster. Repeat ____10 times per set. Do ___1-2_ sets per session. Do ___2_ sessions per day  Copyright  VHI. All rights reserved.    Axial Extension (Chin Tuck)    Pull chin in and lengthen back of neck. Hold __5-10__ seconds while counting out loud. Repeat __5__ times. Do __2-3__ sessions per day.  http://gt2.exer.us/450   Copyright  VHI. All rights reserved.   Reducing Load   Copyright  VHI. All rights reserved.  BODY MECHANICS Tips Good body mechanics are important during activities of daily living. The practice of good body mechanics will: -help distribute weight throughout the skeleton in a more anatomically correct manner thus stimulating more normal forces on the bones, and encouraging stronger, healthier, denser bones. -reduce unnatural forces on bones, ligaments, joints and muscles and reduce risk of fracture, other injury or back pain. A WORD ON BODY POSITIONING: Sitting is the hardest position for the back. Lying on the back is the easiest. Standing, in good body alignment, is somewhere between. A good motto is: Sit less, stand more, and, when you can't do that, lie down on your back and exercise to strengthen it.  Copyright  VHI. All rights reserved.       Supine to Sit (Active)   Lie on back, left leg bent. Roll to other side. From side-lying, sit up on side of bed. Complete ___ sets of ___ repetitions. Perform ___ sessions per day.  Copyright  VHI. All rights reserved.    Housework - Reaching Down   If you are unable to bend your knees or squat, use a lazy Manuela Schwartz to keep items within easy reach. Store only light, unbreakable items on the lowest shelves, and use a reacher to pick them up.  Copyright  VHI. All rights reserved.  Low Shelf   Squat down, and bring item close to  lift.   Copyright  VHI. All rights reserved.  Lifting Principles .Maintain proper posture and head alignment. .Slide object as close as possible before lifting. .Move obstacles out of the way. .Test before lifting; ask for help if too heavy. .Tighten stomach muscles without holding breath. .Use smooth movements; do not jerk. .Use legs to do the work, and pivot with feet. .Distribute the work load symmetrically and close to the center of trunk. .Push instead of pull whenever possible.  Copyright  VHI. All rights reserved.  Posture - Standing   Good posture is important. Avoid slouching and forward head thrust. Maintain curve in low back and align ears over shoul- ders, hips over ankles.   Copyright  VHI. All rights reserved.   Posture - Sitting   Sit upright, head facing forward. Try using a roll to support lower back. Keep shoulders relaxed, and avoid rounded back. Keep hips level with knees. Avoid crossing legs for long periods.   Copyright  VHI. All rights reserved.  Ideal Posture Use with figures on 3 (2 of 2): 1.Head erect 2.Chin in 3.Chest and navel aligned 4.Spinal curves maintained 5.Knees relaxed 6.Shoulders and hips aligned 7.Feet slightly apart 8.Toes and arches active 9.Abdomen taut (breathe with diaphragm) 10.Arms at sides Ideal posture is: -pain free. -achieved with practice, mindful interest, and body awareness.  Copyright  VHI. All rights reserved.     Move heavy items one at a time, or move portions of  the contents.   Posture Awareness     Stand and check posture: Jut chin, pull back to comfortable position. Tilt pelvis forward, back; be sure back is not swayed. Roll from heels to balls of feet, then distribute your weight evenly. Picture a line through spine pulling you erect. Focus on breathing. Good Posture = Better Breathing. Check __all:)__ times per day.  http://gt2.exer.us/873   Copyright  VHI. All rights reserved.

## 2015-06-02 ENCOUNTER — Ambulatory Visit: Payer: 59 | Attending: Neurosurgery | Admitting: Physical Therapy

## 2015-06-02 DIAGNOSIS — M542 Cervicalgia: Secondary | ICD-10-CM | POA: Diagnosis present

## 2015-06-02 DIAGNOSIS — R208 Other disturbances of skin sensation: Secondary | ICD-10-CM | POA: Insufficient documentation

## 2015-06-02 DIAGNOSIS — R29898 Other symptoms and signs involving the musculoskeletal system: Secondary | ICD-10-CM | POA: Diagnosis not present

## 2015-06-02 DIAGNOSIS — G729 Myopathy, unspecified: Secondary | ICD-10-CM | POA: Diagnosis not present

## 2015-06-02 DIAGNOSIS — M6289 Other specified disorders of muscle: Secondary | ICD-10-CM

## 2015-06-02 DIAGNOSIS — R209 Unspecified disturbances of skin sensation: Secondary | ICD-10-CM

## 2015-06-02 DIAGNOSIS — M5010 Cervical disc disorder with radiculopathy, unspecified cervical region: Secondary | ICD-10-CM | POA: Insufficient documentation

## 2015-06-02 NOTE — Therapy (Signed)
[place], [place], [address] Phone: [phone]   Fax:  [phone]  Physical Therapy Treatment  Patient Details  Name: CATELYN FRIEL MRN: [id number] Date of Birth: April 24, 1961 Referring Provider: Vertell Limber  Encounter Date: 06/02/2015      PT End of Session - 06/02/15 1116    Visit Number 2   Number of Visits 16   Date for PT Re-Evaluation 07/20/15   PT Start Time 1100   PT Stop Time 1200   PT Time Calculation (min) 60 min   Activity Tolerance Patient tolerated treatment well   Behavior During Therapy [facility] for tasks assessed/performed      Past Medical History  Diagnosis Date  . Morbid obesity   . IBS (irritable bowel syndrome)     Pt reported on 01/18/13  . Hypercholesteremia   . Hypothyroidism   . H/O Graves' disease   . Depression   . Pneumonia 20's    hx of  . Headache(784.0)     rare  . Arthritis     feet  . PONV (postoperative nausea and vomiting)     severe vomiting  . Schwannoma     left ear    Past Surgical History  Procedure Laterality Date  . Cesarean section      x2  . Cholecystectomy    . Abdominal hysterectomy    . Spine surgery      fusion, nerve release, and replaced a disc in lower back, l5-s1  . Laparoscopic gastric sleeve resection N/A 04/26/2013    Procedure: LAPAROSCOPIC GASTRIC SLEEVE RESECTION;  Surgeon: Edward Jolly, MD;  Location: [facility];  Service: General;  Laterality: N/A;  . Upper gi endoscopy N/A 04/26/2013    Procedure: UPPER GI ENDOSCOPY;  Surgeon: Edward Jolly, MD;  Location: [facility];  Service: General;  Laterality: N/A;  . Liver surgery      There were no vitals filed for this visit.  Visit Diagnosis:  Cervicalgia  Muscle tightness  Sensory disturbance  Cervical disc disorder with radiculopathy of cervical region  Weakness of right arm      Subjective Assessment - 06/02/15 1104    Subjective More numbness in Rt. arm and hand and  elbow.     Currently in Pain? Yes   Pain Score 4    Pain Location Neck   Pain Orientation Right   Pain Descriptors / Indicators Sore;Aching   Pain Type Chronic pain   Pain Onset More than a month ago   Pain Frequency Constant   Aggravating Factors  prolonged sitting, tunring head    Pain Relieving Factors heat, IFC helped   Multiple Pain Sites No                         [facility] Adult PT Treatment/Exercise - 06/02/15 1111    Neck Exercises: Supine   Neck Retraction 10 reps;5 secs   Neck Retraction Limitations added rotation    Cervical Rotation Other reps (comment)   Cervical Rotation Limitations small ROM    Shoulder Flexion 10 reps   Shoulder ABduction Both;10 reps   Other Supine Exercise added lower abd, dead bug   Moist Heat Therapy   Number Minutes Moist Heat 15 Minutes   Moist Heat Location Cervical  R   Electrical Stimulation   Electrical Stimulation Location neck    Electrical Stimulation Action IFC   Electrical Stimulation Parameters to Psychiatric nurse  Goals Pain   Manual Therapy   Manual Therapy Soft tissue mobilization;Myofascial release;Manual Traction   Soft tissue mobilization suboccipitals, gentle friction   Myofascial Release Rt. cervicals   Manual Traction cervical spine added L lateral flexion                PT Education - 06/02/15 1148    Education provided Yes   Education Details posture, HEP   Person(s) Educated Patient   Methods Explanation;Handout   Comprehension Verbalized understanding;Returned demonstration          PT Short Term Goals - 06/02/15 1119    PT SHORT TERM GOAL #1   Title Pt will be able to perform HEP with I for basic Cervical stab and posture   Status On-going   PT SHORT TERM GOAL #2   Title Pt will be able to improve comfort/position of neck with sleeping, 15-25%    Status On-going   PT SHORT TERM GOAL #3   Title Pt will report 25% less symptoms in hands, arms (centralization of  pain) for better work performance   Status On-going   PT [place] #4   Title Pt will demo neck AROM with min pain in neck    Status On-going           PT Long Term Goals - 06/02/15 1119    PT LONG TERM GOAL #1   Title Pt will be I with concepts of posture, body mech and lifting to preent re-injury.    Status On-going   PT LONG TERM GOAL #2   Title Pt will be I with more advanced HEP    Status On-going   PT LONG TERM GOAL #3   Title Pt will demo 5/5 strength in bilateral UEs throughout   Status On-going   PT LONG TERM GOAL #4   Title Pt will be able to demo C-AROM [facility] and no pain   Status On-going   PT LONG TERM GOAL #5   Title Pt will report min, occasional sx in UEs for improved sleep and daily activities    Status On-going   PT LONG TERM GOAL #6   Title Pt will score </=36% limited on FOTO to demo overall improvement   Status Unable to assess               Plan - 06/02/15 1149    Clinical Impression Statement Patient tolerated exercise well, able to relax a little bit more for manual traction.  Felt some relief like "my spine was stretching".  No goals met, 2 nd visit   PT Next Visit Plan questions [id number], assess IFC, chin tuck progression, consider traction/manual and or mechanical   PT Home Exercise Plan chin tuck, posture added scap retraction   Consulted and Agree with Plan of Care Patient        Problem List Patient Active Problem List   Diagnosis Date Noted  . Iron deficiency anemia 11/28/2013  . Anxiety state, unspecified 11/28/2013  . Other specified acquired hypothyroidism 11/28/2013  . Gastroesophageal reflux disease without esophagitis 11/28/2013  . Bilateral low back pain without sciatica 11/28/2013  . Other and unspecified hyperlipidemia 11/28/2013  . Unspecified constipation 11/28/2013  . Unspecified vitamin D deficiency 11/28/2013  . Labial infection 09/03/2013  . Candidiasis of vulva 09/03/2013  . Morbid obesity with BMI of  45.0-49.9, adult ([place]) 04/26/2013  . Unspecified hypothyroidism 04/11/2013  . Morbid obesity ([place]) 01/01/2013  . GRAVE'S DISEASE 01/30/2007  . HYPERLIPIDEMIA 01/30/2007  .  OBESITY NOS 01/30/2007  . TOBACCO USE 01/30/2007  . SYMPTOM, DISTURBANCE, SLEEP NOS 01/30/2007  . HYPERGLYCEMIA 01/30/2007  . DIABETES MELLITUS, TYPE II 01/09/2007  . ANEMIA-NOS 01/09/2007  . DEPRESSION 01/09/2007  . COLONIC POLYPS, HX OF 01/09/2007    PAA,JENNIFER 06/02/2015, 11:56 AM  [facility] [address] [place], [place], [address] Phone: [phone]   Fax:  [phone]  Name: MADDILYNN ESPERANZA MRN: [id number] Date of Birth: February 07, 1961   Raeford Razor, PT 06/02/2015 12:01 PM Phone: [phone] Fax: [phone]

## 2015-06-02 NOTE — Patient Instructions (Signed)
Resisted Horizontal Abduction: Bilateral    Sit or stand, tubing in both hands, arms out in front. Keeping arms straight, pinch shoulder blades together and stretch arms out. Repeat __10-20__ times per set. Do __1__ sets per session. Do __1__ sessions per day.  http://orth.exer.us/969   Copyright  VHI. All rights reserved.   Copyright  VHI. All rights reserved.  Scapular Retraction (Standing)    With arms at sides, pinch shoulder blades together. Repeat __10__ times per set. Do _1-2___ sets per session. Do ___OFTEN_ sessions per day.  http://orth.exer.us/945   Copyright  VHI. All rights reserved.  Neck: Retraction    Sit with back straight or lie on firm surface. Place hands, with fingers locked, behind head. Keep head in neutral position. Push back with head while resisting with hands. Do not let head actually move. Hold ___5_ seconds. Repeat ___10_ times. Do ___2_ sessions per day. CAUTION: Pressure should be steady.  Copyright  VHI. All rights reserved.

## 2015-06-05 ENCOUNTER — Ambulatory Visit: Payer: 59 | Admitting: Physical Therapy

## 2015-06-05 DIAGNOSIS — R208 Other disturbances of skin sensation: Secondary | ICD-10-CM | POA: Diagnosis not present

## 2015-06-05 DIAGNOSIS — M542 Cervicalgia: Secondary | ICD-10-CM | POA: Diagnosis not present

## 2015-06-05 DIAGNOSIS — M5010 Cervical disc disorder with radiculopathy, unspecified cervical region: Secondary | ICD-10-CM

## 2015-06-05 DIAGNOSIS — G729 Myopathy, unspecified: Secondary | ICD-10-CM | POA: Diagnosis not present

## 2015-06-05 DIAGNOSIS — R29898 Other symptoms and signs involving the musculoskeletal system: Secondary | ICD-10-CM | POA: Diagnosis not present

## 2015-06-05 DIAGNOSIS — R209 Unspecified disturbances of skin sensation: Secondary | ICD-10-CM

## 2015-06-05 DIAGNOSIS — M6289 Other specified disorders of muscle: Secondary | ICD-10-CM

## 2015-06-05 DIAGNOSIS — H524 Presbyopia: Secondary | ICD-10-CM | POA: Diagnosis not present

## 2015-06-05 NOTE — Patient Instructions (Signed)
Levator Stretch   Grasp seat or sit on hand on side to be stretched. Turn head toward other side and look down. Use hand on head to gently stretch neck in that position. Hold __30__ seconds. Repeat on other side. Repeat __2-3__ times. Do ____1-2 sessions per day.  http://gt2.exer.us/30   STOP WHEN/IF YOUR HAND/FINGERS GO NUMB.  Copyright  VHI. All rights reserved.  Side-Bending   One hand on opposite side of head, pull head to side as far as is comfortable. Stop if there is pain. Hold _30___ seconds. Repeat with other hand to other side. Repeat ___2-3_ times. Do __2__ sessions per day.   Copyright  VHI. All rights reserved.  Scapular Retraction (Standing)   With arms at sides, pinch shoulder blades together. Repeat __10__ times per set. Do __2__ sets per session. Do _2___ sessions per day.  http://orth.exer.us/944

## 2015-06-05 NOTE — Therapy (Signed)
[place], [place], [address] Phone: [phone]   Fax:  [phone]  Physical Therapy Treatment  Patient Details  Name: Tasha Pearson MRN: [id number] Date of Birth: [birth year] Referring Provider: Vertell Limber  Encounter Date: 06/05/2015      PT End of Session - 06/05/15 1412    Visit Number 3   Number of Visits 16   Date for PT Re-Evaluation 07/20/15   PT Start Time [id number]   PT Stop Time 1423   PT Time Calculation (min) 55 min   Activity Tolerance Patient tolerated treatment well   Behavior During Therapy [facility] for tasks assessed/performed      Past Medical History  Diagnosis Date  . Morbid obesity   . IBS (irritable bowel syndrome)     Pt reported on 01/18/13  . Hypercholesteremia   . Hypothyroidism   . H/O Graves' disease   . Depression   . Pneumonia 20's    hx of  . Headache(784.0)     rare  . Arthritis     feet  . PONV (postoperative nausea and vomiting)     severe vomiting  . Schwannoma     left ear    Past Surgical History  Procedure Laterality Date  . Cesarean section      x2  . Cholecystectomy    . Abdominal hysterectomy    . Spine surgery      fusion, nerve release, and replaced a disc in lower back, l5-s1  . Laparoscopic gastric sleeve resection N/A 04/26/2013    Procedure: LAPAROSCOPIC GASTRIC SLEEVE RESECTION;  Surgeon: Edward Jolly, MD;  Location: [facility];  Service: General;  Laterality: N/A;  . Upper gi endoscopy N/A 04/26/2013    Procedure: UPPER GI ENDOSCOPY;  Surgeon: Edward Jolly, MD;  Location: [facility];  Service: General;  Laterality: N/A;  . Liver surgery      There were no vitals filed for this visit.  Visit Diagnosis:  Cervicalgia  Muscle tightness  Sensory disturbance  Cervical disc disorder with radiculopathy of cervical region  Weakness of right arm      Subjective Assessment - 06/05/15 1328    Subjective Had a lot of numbness Fri night.  Arm  andneck pain seem a little better, not as annoying. I bought a pillow.    Currently in Pain? Yes   Pain Score 2    Pain Location Neck   Pain Orientation Right   Pain Descriptors / Indicators Aching   Pain Type Chronic pain   Pain Onset More than a month ago   Pain Frequency Constant   Aggravating Factors  sleeping incr numbness            [facility] Adult PT Treatment/Exercise - 06/05/15 1333    Neck Exercises: Supine   Other Supine Exercise supine scap stabilization x 10 each red band: overhead, horiz pull, sash and ER   Traction   Type of Traction Cervical   Min (lbs) 7   Max (lbs) 15   Hold Time 60   Rest Time 15   Time 17   Manual Therapy   Manual Therapy Soft tissue mobilization;Myofascial release;Manual Traction   Soft tissue mobilization suboccipitals, gentle friction   Myofascial Release Rt. cervicals   Manual Traction cervical spine added L lateral flexion   Neck Exercises: Stretches   Upper Trapezius Stretch 2 reps;30 seconds   Levator Stretch 2 reps;30 seconds   Corner Stretch 1 rep;30 seconds  Corner Stretch Limitations increased numbenss digits 4-5, did not progress                PT Education - 06/05/15 1410    Education provided Yes   Education Details traction, dry needling   Person(s) Educated Patient   Methods Explanation   Comprehension Verbalized understanding;Returned demonstration;Need further instruction          PT Short Term Goals - 06/02/15 1119    PT SHORT TERM GOAL #1   Title Pt will be able to perform HEP with I for basic Cervical stab and posture   Status On-going   PT SHORT TERM GOAL #2   Title Pt will be able to improve comfort/position of neck with sleeping, 15-25%    Status On-going   PT SHORT TERM GOAL #3   Title Pt will report 25% less symptoms in hands, arms (centralization of pain) for better work performance   Status On-going   PT SHORT TERM GOAL #4   Title Pt will demo neck AROM with min pain in neck    Status  On-going           PT Long Term Goals - 06/02/15 1119    PT LONG TERM GOAL #1   Title Pt will be I with concepts of posture, body mech and lifting to preent re-injury.    Status On-going   PT LONG TERM GOAL #2   Title Pt will be I with more advanced HEP    Status On-going   PT LONG TERM GOAL #3   Title Pt will demo 5/5 strength in bilateral UEs throughout   Status On-going   PT LONG TERM GOAL #4   Title Pt will be able to demo C-AROM [facility] and no pain   Status On-going   PT LONG TERM GOAL #5   Title Pt will report min, occasional sx in UEs for improved sleep and daily activities    Status On-going   PT LONG TERM GOAL #6   Title Pt will score </=36% limited on FOTO to demo overall improvement   Status Unable to assess               Plan - 06/05/15 1412    Clinical Impression Statement Trial of mechanical traction today, bette rbale to relax neck.  Multiple trigger points in Rt. upper trap, levator, may condier dry needling.     PT Next Visit Plan assess traction, cont stabilization and manual/IFC if needed   PT Home Exercise Plan chin tuck, posture added scap retraction added tension stretching   Consulted and Agree with Plan of Care Patient        Problem List Patient Active Problem List   Diagnosis Date Noted  . Iron deficiency anemia 11/28/2013  . Anxiety state, unspecified 11/28/2013  . Other specified acquired hypothyroidism 11/28/2013  . Gastroesophageal reflux disease without esophagitis 11/28/2013  . Bilateral low back pain without sciatica 11/28/2013  . Other and unspecified hyperlipidemia 11/28/2013  . Unspecified constipation 11/28/2013  . Unspecified vitamin D deficiency 11/28/2013  . Labial infection 09/03/2013  . Candidiasis of vulva 09/03/2013  . Morbid obesity with BMI of 45.0-49.9, adult ([place]) 04/26/2013  . Unspecified hypothyroidism 04/11/2013  . Morbid obesity ([place]) 01/01/2013  . GRAVE'S DISEASE 01/30/2007  . HYPERLIPIDEMIA 01/30/2007  .  OBESITY NOS 01/30/2007  . TOBACCO USE 01/30/2007  . SYMPTOM, DISTURBANCE, SLEEP NOS 01/30/2007  . HYPERGLYCEMIA 01/30/2007  . DIABETES MELLITUS, TYPE II 01/09/2007  . ANEMIA-NOS 01/09/2007  .  DEPRESSION 01/09/2007  . COLONIC POLYPS, HX OF 01/09/2007    PAA,JENNIFER 06/05/2015, 3:22 PM  [facility] [address] [place], [place], [address] Phone: [phone]   Fax:  [phone]  Name: Tasha Pearson MRN: [id number] Date of Birth: Oct 05, 1960   Raeford Razor, PT 06/05/2015 3:22 PM Phone: [phone] Fax: [phone]

## 2015-06-06 ENCOUNTER — Ambulatory Visit: Payer: 59 | Admitting: Physical Therapy

## 2015-06-06 ENCOUNTER — Other Ambulatory Visit (HOSPITAL_COMMUNITY): Payer: Self-pay | Admitting: Endocrinology

## 2015-06-06 DIAGNOSIS — M6289 Other specified disorders of muscle: Secondary | ICD-10-CM

## 2015-06-06 DIAGNOSIS — M5010 Cervical disc disorder with radiculopathy, unspecified cervical region: Secondary | ICD-10-CM | POA: Diagnosis not present

## 2015-06-06 DIAGNOSIS — E890 Postprocedural hypothyroidism: Secondary | ICD-10-CM | POA: Diagnosis not present

## 2015-06-06 DIAGNOSIS — G729 Myopathy, unspecified: Secondary | ICD-10-CM | POA: Diagnosis not present

## 2015-06-06 DIAGNOSIS — M542 Cervicalgia: Secondary | ICD-10-CM

## 2015-06-06 DIAGNOSIS — R209 Unspecified disturbances of skin sensation: Secondary | ICD-10-CM

## 2015-06-06 DIAGNOSIS — R29898 Other symptoms and signs involving the musculoskeletal system: Secondary | ICD-10-CM | POA: Diagnosis not present

## 2015-06-06 DIAGNOSIS — E041 Nontoxic single thyroid nodule: Secondary | ICD-10-CM | POA: Diagnosis not present

## 2015-06-06 DIAGNOSIS — R208 Other disturbances of skin sensation: Secondary | ICD-10-CM | POA: Diagnosis not present

## 2015-06-06 MED FILL — FLUoxetine HCL 40 MG CAPS: 40 | 30 days supply | Qty: 30 | Fill #0

## 2015-06-06 MED FILL — BUPROPION HCL XL 300 MG TAB: 300 | 30 days supply | Qty: 30 | Fill #0

## 2015-06-06 NOTE — Therapy (Signed)
[place], [place], [address] Phone: [phone]   Fax:  [phone]  Physical Therapy Treatment  Patient Details  Name: Tasha Pearson MRN: [id number] Date of Birth: May 23, 1960 Referring Provider: Vertell Limber  Encounter Date: 06/06/2015      PT End of Session - 06/06/15 1508    Visit Number 4   Number of Visits 16   Date for PT Re-Evaluation 07/20/15   PT Start Time 0303   PT Stop Time 0348   PT Time Calculation (min) 45 min      Past Medical History  Diagnosis Date  . Morbid obesity   . IBS (irritable bowel syndrome)     Pt reported on 01/18/13  . Hypercholesteremia   . Hypothyroidism   . H/O Graves' disease   . Depression   . Pneumonia 20's    hx of  . Headache(784.0)     rare  . Arthritis     feet  . PONV (postoperative nausea and vomiting)     severe vomiting  . Schwannoma     left ear    Past Surgical History  Procedure Laterality Date  . Cesarean section      x2  . Cholecystectomy    . Abdominal hysterectomy    . Spine surgery      fusion, nerve release, and replaced a disc in lower back, l5-s1  . Laparoscopic gastric sleeve resection N/A 04/26/2013    Procedure: LAPAROSCOPIC GASTRIC SLEEVE RESECTION;  Surgeon: Edward Jolly, MD;  Location: [facility];  Service: General;  Laterality: N/A;  . Upper gi endoscopy N/A 04/26/2013    Procedure: UPPER GI ENDOSCOPY;  Surgeon: Edward Jolly, MD;  Location: [facility];  Service: General;  Laterality: N/A;  . Liver surgery      There were no vitals filed for this visit.  Visit Diagnosis:  Cervicalgia  Muscle tightness  Sensory disturbance  Cervical disc disorder with radiculopathy of cervical region  Weakness of right arm      Subjective Assessment - 06/06/15 1506    Subjective Only one episode of numbness last night.    Currently in Pain? Yes   Pain Score 6    Pain Location Neck   Pain Orientation Right   Pain Descriptors  / Indicators Sharp   Pain Type Chronic pain                         [facility] Adult PT Treatment/Exercise - 06/06/15 0001    Neck Exercises: Supine   Neck Retraction Limitations increased pain today- dc for today   Other Supine Exercise supine scap stabilization x 10 each yellow band: overhead, horiz pull, sash and ER   Other Supine Exercise supine scap squeeze x 10   Moist Heat Therapy   Number Minutes Moist Heat 15 Minutes   Moist Heat Location Cervical   Electrical Stimulation   Electrical Stimulation Location neck x 15 min   Electrical Stimulation Action IFC   Electrical Stimulation Parameters 17   Electrical Stimulation Goals Pain   Manual Therapy   Soft tissue mobilization suboccipitals, gentle friction   Myofascial Release Cervical paraspinals upper traps                PT Education - 06/05/15 1410    Education provided Yes   Education Details traction, dry needling   Person(s) Educated Patient   Methods Explanation   Comprehension Verbalized understanding;Returned demonstration;Need further  instruction          PT Short Term Goals - 06/02/15 1119    PT SHORT TERM GOAL #1   Title Pt will be able to perform HEP with I for basic Cervical stab and posture   Status On-going   PT SHORT TERM GOAL #2   Title Pt will be able to improve comfort/position of neck with sleeping, 15-25%    Status On-going   PT SHORT TERM GOAL #3   Title Pt will report 25% less symptoms in hands, arms (centralization of pain) for better work performance   Status On-going   PT SHORT TERM GOAL #4   Title Pt will demo neck AROM with min pain in neck    Status On-going           PT Long Term Goals - 06/02/15 1119    PT LONG TERM GOAL #1   Title Pt will be I with concepts of posture, body mech and lifting to preent re-injury.    Status On-going   PT LONG TERM GOAL #2   Title Pt will be I with more advanced HEP    Status On-going   PT LONG TERM GOAL #3   Title Pt  will demo 5/5 strength in bilateral UEs throughout   Status On-going   PT LONG TERM GOAL #4   Title Pt will be able to demo C-AROM [facility] and no pain   Status On-going   PT LONG TERM GOAL #5   Title Pt will report min, occasional sx in UEs for improved sleep and daily activities    Status On-going   PT LONG TERM GOAL #6   Title Pt will score </=36% limited on FOTO to demo overall improvement   Status Unable to assess               Plan - 06/06/15 1545    Clinical Impression Statement Pt reports increased muscle soreness/pain after waking this morning. Traction performed in clinic yesterday and pt notes decreased arm pain followng.  Manual techniques to decrease pain as well as HMP/IFC. Pt receptive to dry needling for trigger point release. Scheduled pt for next two weeks to receive dry needling 1 x per week.    PT Next Visit Plan dry needling for neck, scap as needed, gentle neck and scap stab, manual and IFC as needed.         Problem List Patient Active Problem List   Diagnosis Date Noted  . Iron deficiency anemia 11/28/2013  . Anxiety state, unspecified 11/28/2013  . Other specified acquired hypothyroidism 11/28/2013  . Gastroesophageal reflux disease without esophagitis 11/28/2013  . Bilateral low back pain without sciatica 11/28/2013  . Other and unspecified hyperlipidemia 11/28/2013  . Unspecified constipation 11/28/2013  . Unspecified vitamin D deficiency 11/28/2013  . Labial infection 09/03/2013  . Candidiasis of vulva 09/03/2013  . Morbid obesity with BMI of 45.0-49.9, adult ([place]) 04/26/2013  . Unspecified hypothyroidism 04/11/2013  . Morbid obesity ([place]) 01/01/2013  . GRAVE'S DISEASE 01/30/2007  . HYPERLIPIDEMIA 01/30/2007  . OBESITY NOS 01/30/2007  . TOBACCO USE 01/30/2007  . SYMPTOM, DISTURBANCE, SLEEP NOS 01/30/2007  . HYPERGLYCEMIA 01/30/2007  . DIABETES MELLITUS, TYPE II 01/09/2007  . ANEMIA-NOS 01/09/2007  . DEPRESSION 01/09/2007  . COLONIC POLYPS,  HX OF 01/09/2007    Dorene Ar, PTA 06/06/2015, 3:54 PM  [facility] [address] [place], [place], [address] Phone: [phone]   Fax:  [phone]  Name:  MANDANA TREASE MRN: [id number] Date of Birth: July 14, 1960

## 2015-06-12 ENCOUNTER — Ambulatory Visit (HOSPITAL_COMMUNITY): Payer: 59

## 2015-06-13 ENCOUNTER — Ambulatory Visit (HOSPITAL_COMMUNITY)
Admission: RE | Admit: 2015-06-13 | Discharge: 2015-06-13 | Disposition: A | Discharge status: Home / Self Care | Payer: 59 | Source: Ambulatory Visit | Attending: Endocrinology | Admitting: Endocrinology

## 2015-06-13 ENCOUNTER — Ambulatory Visit: Payer: 59 | Admitting: Physical Therapy

## 2015-06-13 DIAGNOSIS — R208 Other disturbances of skin sensation: Secondary | ICD-10-CM | POA: Diagnosis not present

## 2015-06-13 DIAGNOSIS — R29898 Other symptoms and signs involving the musculoskeletal system: Secondary | ICD-10-CM | POA: Diagnosis not present

## 2015-06-13 DIAGNOSIS — E041 Nontoxic single thyroid nodule: Secondary | ICD-10-CM | POA: Insufficient documentation

## 2015-06-13 DIAGNOSIS — M542 Cervicalgia: Secondary | ICD-10-CM | POA: Diagnosis not present

## 2015-06-13 DIAGNOSIS — M6289 Other specified disorders of muscle: Secondary | ICD-10-CM

## 2015-06-13 DIAGNOSIS — R599 Enlarged lymph nodes, unspecified: Secondary | ICD-10-CM | POA: Insufficient documentation

## 2015-06-13 DIAGNOSIS — G729 Myopathy, unspecified: Secondary | ICD-10-CM | POA: Diagnosis not present

## 2015-06-13 DIAGNOSIS — R209 Unspecified disturbances of skin sensation: Secondary | ICD-10-CM

## 2015-06-13 DIAGNOSIS — M5010 Cervical disc disorder with radiculopathy, unspecified cervical region: Secondary | ICD-10-CM

## 2015-06-13 MED FILL — SYNTHROID 137 MCG TABLET: 137 | 90 days supply | Qty: 90 | Fill #0

## 2015-06-13 NOTE — Therapy (Signed)
[place], [place], [address] Phone: [phone]   Fax:  [phone]  Physical Therapy Treatment  Patient Details  Name: Tasha Pearson MRN: [id number] Date of Birth: 1960-05-02 Referring Provider: Vertell Limber  Encounter Date: 06/13/2015      PT End of Session - 06/13/15 0847    Visit Number 5   Number of Visits 16   Date for PT Re-Evaluation 07/20/15   PT Start Time 0800   PT Stop Time 0856   PT Time Calculation (min) 56 min   Activity Tolerance Patient tolerated treatment well   Behavior During Therapy [facility] for tasks assessed/performed      Past Medical History  Diagnosis Date  . Morbid obesity   . IBS (irritable bowel syndrome)     Pt reported on 01/18/13  . Hypercholesteremia   . Hypothyroidism   . H/O Graves' disease   . Depression   . Pneumonia 20's    hx of  . Headache(784.0)     rare  . Arthritis     feet  . PONV (postoperative nausea and vomiting)     severe vomiting  . Schwannoma     left ear    Past Surgical History  Procedure Laterality Date  . Cesarean section      x2  . Cholecystectomy    . Abdominal hysterectomy    . Spine surgery      fusion, nerve release, and replaced a disc in lower back, l5-s1  . Laparoscopic gastric sleeve resection N/A 04/26/2013    Procedure: LAPAROSCOPIC GASTRIC SLEEVE RESECTION;  Surgeon: Edward Jolly, MD;  Location: [facility];  Service: General;  Laterality: N/A;  . Upper gi endoscopy N/A 04/26/2013    Procedure: UPPER GI ENDOSCOPY;  Surgeon: Edward Jolly, MD;  Location: [facility];  Service: General;  Laterality: N/A;  . Liver surgery      There were no vitals filed for this visit.  Visit Diagnosis:  Cervicalgia  Muscle tightness  Sensory disturbance  Cervical disc disorder with radiculopathy of cervical region  Weakness of right arm      Subjective Assessment - 06/13/15 0801    Subjective "its going okay, the neck is sore. I  think the muscles are more sore"   Currently in Pain? Yes   Pain Score 5    Pain Location Neck   Pain Orientation Right   Pain Descriptors / Indicators Tightness;Sore  stiff   Pain Type Chronic pain   Pain Onset More than a month ago   Pain Frequency Constant   Aggravating Factors  unknown, just always there   Pain Relieving Factors heat, massage                         [facility] Adult PT Treatment/Exercise - 06/13/15 0852    Self-Care   Self-Care Other Self-Care Comments   Other Self-Care Comments  education regarding anatomy of upper trap, and sub-occipitals and how they can effect the head and neck and refer pain   Neck Exercises: Seated   Neck Retraction 10 reps;5 secs   Moist Heat Therapy   Number Minutes Moist Heat 10 Minutes   Moist Heat Location Shoulder  R upper trap   Manual Therapy   Soft tissue mobilization instrument assisted STM over the R upper trap and levator scap   Myofascial Release r upper trap rolling, and gentle myofasical stretching   Neck Exercises: Stretches  Upper Trapezius Stretch 2 reps;30 seconds   Levator Stretch 2 reps;30 seconds   Corner Stretch 2 reps;30 seconds   Corner Stretch Limitations add to [employer] Dry Needling - 06/13/15 0840    Consent Given? Yes   Education Handout Provided Yes   Muscles Treated Upper Body Upper trapezius  R upper trap   Upper Trapezius Response Twitch reponse elicited;Palpable increased muscle length              PT Education - 06/13/15 0847    Education provided Yes   Education Details dry needling education, updated HEP   Person(s) Educated Patient   Methods Explanation   Comprehension Verbalized understanding          PT Short Term Goals - 06/13/15 0850    PT SHORT TERM GOAL #1   Title Pt will be able to perform HEP with I for basic Cervical stab and posture   Time 2   Period Weeks   Status On-going   PT SHORT TERM GOAL #2   Title Pt will be able to  improve comfort/position of neck with sleeping, 15-25%    Time 4   Period Weeks   Status On-going   PT SHORT TERM GOAL #3   Title Pt will report 25% less symptoms in hands, arms (centralization of pain) for better work performance   Time 4   Period Weeks   Status On-going   PT SHORT TERM GOAL #4   Title Pt will demo neck AROM with min pain in neck    Time 4   Period Weeks   Status On-going           PT Long Term Goals - 06/13/15 [id number]    PT LONG TERM GOAL #1   Title Pt will be I with concepts of posture, body mech and lifting to preent re-injury.    Time 8   Period Weeks   Status On-going   PT LONG TERM GOAL #2   Title Pt will be I with more advanced HEP    Time 8   Period Weeks   Status On-going   PT LONG TERM GOAL #3   Title Pt will demo 5/5 strength in bilateral UEs throughout   Time 8   Period Weeks   Status On-going   PT LONG TERM GOAL #4   Title Pt will be able to demo C-AROM [facility] and no pain   Time 8   Period Weeks   Status On-going   PT LONG TERM GOAL #5   Title Pt will report min, occasional sx in UEs for improved sleep and daily activities    Time 8   Period Weeks   Status On-going   PT LONG TERM GOAL #6   Title Pt will score </=36% limited on FOTO to demo overall improvement   Time 8   Period Weeks   Status Unable to assess               Plan - 06/13/15 0847    Clinical Impression Statement Ezlyn reports that she is doing a little better but did have some soreness the next day after the last session with the traction. she provided consent for dry needling of the R upper trap and had multiple palpable twitches which she reported relief of tightness and pain follwing DN and instrument assisted STM and myofascial release. She reported no tingling in the arm following todays  session. utlized heat to decrease additional soreness following todays treatment, and she reported pain to 2/10.    PT Next Visit Plan assess dry needling, scap as needed,  gentle neck and scap stab, manual and IFC as needed.    PT Home Exercise Plan door way/ corner stretching   Consulted and Agree with Plan of Care Patient        Problem List Patient Active Problem List   Diagnosis Date Noted  . Iron deficiency anemia 11/28/2013  . Anxiety state, unspecified 11/28/2013  . Other specified acquired hypothyroidism 11/28/2013  . Gastroesophageal reflux disease without esophagitis 11/28/2013  . Bilateral low back pain without sciatica 11/28/2013  . Other and unspecified hyperlipidemia 11/28/2013  . Unspecified constipation 11/28/2013  . Unspecified vitamin D deficiency 11/28/2013  . Labial infection 09/03/2013  . Candidiasis of vulva 09/03/2013  . Morbid obesity with BMI of 45.0-49.9, adult ([place]) 04/26/2013  . Unspecified hypothyroidism 04/11/2013  . Morbid obesity ([place]) 01/01/2013  . GRAVE'S DISEASE 01/30/2007  . HYPERLIPIDEMIA 01/30/2007  . OBESITY NOS 01/30/2007  . TOBACCO USE 01/30/2007  . SYMPTOM, DISTURBANCE, SLEEP NOS 01/30/2007  . HYPERGLYCEMIA 01/30/2007  . DIABETES MELLITUS, TYPE II 01/09/2007  . ANEMIA-NOS 01/09/2007  . DEPRESSION 01/09/2007  . COLONIC POLYPS, HX OF 01/09/2007   Starr Lake PT, DPT, LAT, ATC  06/13/2015  8:57 AM          [facility] [address] [place], [place], [address] Phone: [phone]   Fax:  [phone]  Name: YAMILES BOULAY MRN: [id number] Date of Birth: 1960/08/09

## 2015-06-13 NOTE — Patient Instructions (Signed)
   Jillene Wehrenberg PT, DPT, LAT, ATC  [facility] Phone: [phone]     

## 2015-06-15 ENCOUNTER — Other Ambulatory Visit: Payer: Self-pay | Admitting: Endocrinology

## 2015-06-15 ENCOUNTER — Ambulatory Visit: Payer: 59

## 2015-06-15 DIAGNOSIS — E041 Nontoxic single thyroid nodule: Secondary | ICD-10-CM

## 2015-06-20 ENCOUNTER — Other Ambulatory Visit (HOSPITAL_COMMUNITY)
Admission: RE | Admit: 2015-06-20 | Discharge: 2015-06-20 | Disposition: A | Discharge status: Home / Self Care | Payer: 59 | Source: Ambulatory Visit | Attending: Physician Assistant | Admitting: Physician Assistant

## 2015-06-20 ENCOUNTER — Ambulatory Visit
Admission: RE | Admit: 2015-06-20 | Discharge: 2015-06-20 | Disposition: A | Discharge status: Home / Self Care | Payer: 59 | Source: Ambulatory Visit | Attending: Endocrinology | Admitting: Endocrinology

## 2015-06-20 DIAGNOSIS — E041 Nontoxic single thyroid nodule: Secondary | ICD-10-CM | POA: Diagnosis not present

## 2015-06-20 NOTE — Procedures (Signed)
Using direct ultrasound guidance, 5 passes were made using needles into the nodule within the right lobe of the thyroid.   Ultrasound was used to confirm needle placements on all occasions.   Specimens were sent to Pathology for analysis.   Zahara Rembert S Jesusmanuel Erbes PA-C 06/20/2015 8:40 AM

## 2015-06-21 ENCOUNTER — Ambulatory Visit: Payer: 59 | Admitting: Physical Therapy

## 2015-06-21 DIAGNOSIS — R209 Unspecified disturbances of skin sensation: Secondary | ICD-10-CM

## 2015-06-21 DIAGNOSIS — M5010 Cervical disc disorder with radiculopathy, unspecified cervical region: Secondary | ICD-10-CM | POA: Diagnosis not present

## 2015-06-21 DIAGNOSIS — R29898 Other symptoms and signs involving the musculoskeletal system: Secondary | ICD-10-CM | POA: Diagnosis not present

## 2015-06-21 DIAGNOSIS — M6289 Other specified disorders of muscle: Secondary | ICD-10-CM

## 2015-06-21 DIAGNOSIS — R208 Other disturbances of skin sensation: Secondary | ICD-10-CM | POA: Diagnosis not present

## 2015-06-21 DIAGNOSIS — M542 Cervicalgia: Secondary | ICD-10-CM | POA: Diagnosis not present

## 2015-06-21 DIAGNOSIS — G729 Myopathy, unspecified: Secondary | ICD-10-CM | POA: Diagnosis not present

## 2015-06-21 NOTE — Therapy (Signed)
[place], [place], [address] Phone: [phone]   Fax:  [phone]  Physical Therapy Treatment  Patient Details  Name: Tasha Pearson MRN: [id number] Date of Birth: 1960-07-13 Referring Provider: Vertell Limber  Encounter Date: 06/21/2015      PT End of Session - 06/21/15 1013    Visit Number 6   Number of Visits 16   Date for PT Re-Evaluation 07/20/15   PT Start Time 0930   PT Stop Time 1028   PT Time Calculation (min) 58 min   Activity Tolerance Patient tolerated treatment well   Behavior During Therapy [facility] for tasks assessed/performed      Past Medical History  Diagnosis Date  . Morbid obesity   . IBS (irritable bowel syndrome)     Pt reported on 01/18/13  . Hypercholesteremia   . Hypothyroidism   . H/O Graves' disease   . Depression   . Pneumonia 20's    hx of  . Headache(784.0)     rare  . Arthritis     feet  . PONV (postoperative nausea and vomiting)     severe vomiting  . Schwannoma     left ear    Past Surgical History  Procedure Laterality Date  . Cesarean section      x2  . Cholecystectomy    . Abdominal hysterectomy    . Spine surgery      fusion, nerve release, and replaced a disc in lower back, l5-s1  . Laparoscopic gastric sleeve resection N/A 04/26/2013    Procedure: LAPAROSCOPIC GASTRIC SLEEVE RESECTION;  Surgeon: Edward Jolly, MD;  Location: [facility];  Service: General;  Laterality: N/A;  . Upper gi endoscopy N/A 04/26/2013    Procedure: UPPER GI ENDOSCOPY;  Surgeon: Edward Jolly, MD;  Location: [facility];  Service: General;  Laterality: N/A;  . Liver surgery      There were no vitals filed for this visit.  Visit Diagnosis:  Cervicalgia  Muscle tightness  Sensory disturbance  Cervical disc disorder with radiculopathy of cervical region  Weakness of right arm      Subjective Assessment - 06/21/15 0929    Subjective "The needling helped but it took 2  days after"    Currently in Pain? No/denies   Pain Location Neck   Pain Orientation Right                         [facility] Adult PT Treatment/Exercise - 06/21/15 0001    Shoulder Exercises: ROM/Strengthening   UBE (Upper Arm Bike) L1 x 4 min  changing direction at 4 min   Moist Heat Therapy   Number Minutes Moist Heat 10 Minutes   Moist Heat Location Shoulder   Electrical Stimulation   Electrical Stimulation Location R upper trap   Electrical Stimulation Action IFC   Electrical Stimulation Parameters scan 100%, L 17   Electrical Stimulation Goals Pain   Manual Therapy   Manual Therapy Joint mobilization   Manual therapy comments contract relax of r upper trap with 10 sec hold and 30 sec stretch   Joint Mobilization Grade 3 thoracic T1-T8 P>A mobs   Soft tissue mobilization instrument assisted STM over the R upper trap and levator scap   Myofascial Release r upper trap rolling, and gentle myofasical stretching   Manual Traction cervical spine added L lateral flexion   Neck Exercises: Stretches   Levator Stretch 2 reps;30 seconds  Trigger Point Dry Needling - 06/21/15 1006    Consent Given? Yes   Education Handout Provided Yes   Muscles Treated Upper Body Upper trapezius;Levator scapulae   Upper Trapezius Response Twitch reponse elicited;Palpable increased muscle length  R    Levator Scapulae Response Twitch response elicited;Palpable increased muscle length  R                PT Short Term Goals - 06/13/15 0850    PT SHORT TERM GOAL #1   Title Pt will be able to perform HEP with I for basic Cervical stab and posture   Time 2   Period Weeks   Status On-going   PT SHORT TERM GOAL #2   Title Pt will be able to improve comfort/position of neck with sleeping, 15-25%    Time 4   Period Weeks   Status On-going   PT SHORT TERM GOAL #3   Title Pt will report 25% less symptoms in hands, arms (centralization of pain) for better work performance    Time 4   Period Weeks   Status On-going   PT SHORT TERM GOAL #4   Title Pt will demo neck AROM with min pain in neck    Time 4   Period Weeks   Status On-going           PT Long Term Goals - 06/13/15 [id number]    PT LONG TERM GOAL #1   Title Pt will be I with concepts of posture, body mech and lifting to preent re-injury.    Time 8   Period Weeks   Status On-going   PT LONG TERM GOAL #2   Title Pt will be I with more advanced HEP    Time 8   Period Weeks   Status On-going   PT LONG TERM GOAL #3   Title Pt will demo 5/5 strength in bilateral UEs throughout   Time 8   Period Weeks   Status On-going   PT LONG TERM GOAL #4   Title Pt will be able to demo C-AROM [facility] and no pain   Time 8   Period Weeks   Status On-going   PT LONG TERM GOAL #5   Title Pt will report min, occasional sx in UEs for improved sleep and daily activities    Time 8   Period Weeks   Status On-going   PT LONG TERM GOAL #6   Title Pt will score </=36% limited on FOTO to demo overall improvement   Time 8   Period Weeks   Status Unable to assess               Plan - 06/21/15 1013    Clinical Impression Statement Ardenia reprots that the needling really helped last time. todays she reported no pain but still has some intermittent tingling into the hand. pt provided consent for dry needling of the proximal upper trap and levator scapulae where multiple twiches were palpable. She reported relief of pain following mysofascial release and instrument assisted STM release. following E-stim and heat she reported decreased pain and tightness in the shoulder. Plan to progress with strengthening next visit.    PT Next Visit Plan assess dry needling, scap as needed, gentle neck and scap stab, manual and IFC as needed, assess goals, FOTO    Consulted and Agree with Plan of Care Patient        Problem List Patient Active Problem List   Diagnosis Date Noted  .  Iron deficiency anemia 11/28/2013  . Anxiety  state, unspecified 11/28/2013  . Other specified acquired hypothyroidism 11/28/2013  . Gastroesophageal reflux disease without esophagitis 11/28/2013  . Bilateral low back pain without sciatica 11/28/2013  . Other and unspecified hyperlipidemia 11/28/2013  . Unspecified constipation 11/28/2013  . Unspecified vitamin D deficiency 11/28/2013  . Labial infection 09/03/2013  . Candidiasis of vulva 09/03/2013  . Morbid obesity with BMI of 45.0-49.9, adult ([place]) 04/26/2013  . Unspecified hypothyroidism 04/11/2013  . Morbid obesity ([place]) 01/01/2013  . GRAVE'S DISEASE 01/30/2007  . HYPERLIPIDEMIA 01/30/2007  . OBESITY NOS 01/30/2007  . TOBACCO USE 01/30/2007  . SYMPTOM, DISTURBANCE, SLEEP NOS 01/30/2007  . HYPERGLYCEMIA 01/30/2007  . DIABETES MELLITUS, TYPE II 01/09/2007  . ANEMIA-NOS 01/09/2007  . DEPRESSION 01/09/2007  . COLONIC POLYPS, HX OF 01/09/2007   Starr Lake PT, DPT, LAT, ATC  06/21/2015  12:48 PM     [facility] [address] [place], [place], [address] Phone: [phone]   Fax:  [phone]  Name: TANZA VRANICH MRN: [id number] Date of Birth: [birth year]

## 2015-06-23 ENCOUNTER — Ambulatory Visit: Payer: 59

## 2015-06-27 ENCOUNTER — Ambulatory Visit: Payer: 59 | Admitting: Physical Therapy

## 2015-06-27 DIAGNOSIS — M5010 Cervical disc disorder with radiculopathy, unspecified cervical region: Secondary | ICD-10-CM | POA: Diagnosis not present

## 2015-06-27 DIAGNOSIS — R209 Unspecified disturbances of skin sensation: Secondary | ICD-10-CM

## 2015-06-27 DIAGNOSIS — R29898 Other symptoms and signs involving the musculoskeletal system: Secondary | ICD-10-CM | POA: Diagnosis not present

## 2015-06-27 DIAGNOSIS — M542 Cervicalgia: Secondary | ICD-10-CM | POA: Diagnosis not present

## 2015-06-27 DIAGNOSIS — M6289 Other specified disorders of muscle: Secondary | ICD-10-CM

## 2015-06-27 DIAGNOSIS — R208 Other disturbances of skin sensation: Secondary | ICD-10-CM | POA: Diagnosis not present

## 2015-06-27 DIAGNOSIS — G729 Myopathy, unspecified: Secondary | ICD-10-CM | POA: Diagnosis not present

## 2015-06-27 NOTE — Therapy (Signed)
[place], [place], [address] Phone: [phone]   Fax:  [phone]  Physical Therapy Treatment  Patient Details  Name: Tasha Pearson MRN: [id number] Date of Birth: 02-13-1961 Referring Provider: Vertell Limber  Encounter Date: 06/27/2015      PT End of Session - 06/27/15 1117    Visit Number 7   Number of Visits 16   Date for PT Re-Evaluation 07/20/15   PT Start Time 0730   PT Stop Time 0830   PT Time Calculation (min) 60 min   Activity Tolerance Patient tolerated treatment well   Behavior During Therapy [facility] for tasks assessed/performed      Past Medical History  Diagnosis Date  . Morbid obesity   . IBS (irritable bowel syndrome)     Pt reported on 01/18/13  . Hypercholesteremia   . Hypothyroidism   . H/O Graves' disease   . Depression   . Pneumonia 20's    hx of  . Headache(784.0)     rare  . Arthritis     feet  . PONV (postoperative nausea and vomiting)     severe vomiting  . Schwannoma     left ear    Past Surgical History  Procedure Laterality Date  . Cesarean section      x2  . Cholecystectomy    . Abdominal hysterectomy    . Spine surgery      fusion, nerve release, and replaced a disc in lower back, l5-s1  . Laparoscopic gastric sleeve resection N/A 04/26/2013    Procedure: LAPAROSCOPIC GASTRIC SLEEVE RESECTION;  Surgeon: Edward Jolly, MD;  Location: [facility];  Service: General;  Laterality: N/A;  . Upper gi endoscopy N/A 04/26/2013    Procedure: UPPER GI ENDOSCOPY;  Surgeon: Edward Jolly, MD;  Location: [facility];  Service: General;  Laterality: N/A;  . Liver surgery      There were no vitals filed for this visit.  Visit Diagnosis:  Cervicalgia  Muscle tightness  Sensory disturbance  Cervical disc disorder with radiculopathy of cervical region  Weakness of right arm      Subjective Assessment - 06/27/15 0733    Subjective No pain , just stiff.  I have been  doing exercises.  I have been working on my posture at work.     Currently in Pain? Yes   Pain Score 5    Pain Location Neck   Pain Orientation Right   Pain Descriptors / Indicators Tightness   Pain Radiating Towards elbow   Aggravating Factors  sleeping on side RT    Pain Relieving Factors change of position, Dry needle.                           [place] Adult PT Treatment/Exercise - 06/27/15 0738    Neck Exercises: Supine   Other Supine Exercise  LT feels stronger with 3 LBS each  , cervical stabilization.  circles,  horizontal abduction, flexion extension 2 sets   Other Supine Exercise supine scapular stabilization,  red band issued.  whole series 10 x each minor cues   Shoulder Exercises: ROM/Strengthening   UBE (Upper Arm Bike) L1 1 minute each direction.   Moist Heat Therapy   Number Minutes Moist Heat 15 Minutes   Moist Heat Location Cervical;Shoulder   Electrical Stimulation   Electrical Stimulation Location R upper trap   Electrical Stimulation Action IFC   Electrical Stimulation Parameters 9  Electrical Stimulation Goals Pain   Manual Therapy   Manual therapy comments trigger point release, brief RT upper traps. to decrease tightness.   Neck Exercises: Stretches   Upper Trapezius Stretch 2 reps  2 head positions with arm behind back.  gentle.    Corner Stretch --  3 X 30 seconds at door way.                  PT Short Term Goals - 06/27/15 1122    PT SHORT TERM GOAL #1   Title Pt will be able to perform HEP with I for basic Cervical stab and posture   Baseline able to do correctly   Time 2   Period Weeks   Status Achieved   PT SHORT TERM GOAL #2   Title Pt will be able to improve comfort/position of neck with sleeping, 15-25%    Baseline still painful   Time 4   Period Weeks   Status On-going   PT SHORT TERM GOAL #3   Title Pt will report 25% less symptoms in hands, arms (centralization of pain) for better work performance    Baseline varies, improving   Time 4   Period Weeks   Status On-going   PT SHORT TERM GOAL #4   Title Pt will demo neck AROM with min pain in neck    Time 4   Period Weeks   Status On-going           PT Long Term Goals - 06/13/15 4158    PT LONG TERM GOAL #1   Title Pt will be I with concepts of posture, body mech and lifting to preent re-injury.    Time 8   Period Weeks   Status On-going   PT LONG TERM GOAL #2   Title Pt will be I with more advanced HEP    Time 8   Period Weeks   Status On-going   PT LONG TERM GOAL #3   Title Pt will demo 5/5 strength in bilateral UEs throughout   Time 8   Period Weeks   Status On-going   PT LONG TERM GOAL #4   Title Pt will be able to demo C-AROM [facility] and no pain   Time 8   Period Weeks   Status On-going   PT LONG TERM GOAL #5   Title Pt will report min, occasional sx in UEs for improved sleep and daily activities    Time 8   Period Weeks   Status On-going   PT LONG TERM GOAL #6   Title Pt will score </=36% limited on FOTO to demo overall improvement   Time 8   Period Weeks   Status Unable to assess               Plan - 06/27/15 1117    Clinical Impression Statement Pain now tightness, Does have return of elbow symptoms over the weekend.  Her head felt heavy post stretching.  Able to progress band strength with scapular stabilization. STG#1 met.  Body mechanics review.   PT Next Visit Plan FOTO next visit.  Tried today but it did not work,  patient wanted a cleaner tablet with a pen vs using the screen.  Try neck isometrics for "Heavy head"?     PT Home Exercise Plan continue   Consulted and Agree with Plan of Care Patient        Problem List Patient Active Problem List   Diagnosis Date Noted  .  Iron deficiency anemia 11/28/2013  . Anxiety state, unspecified 11/28/2013  . Other specified acquired hypothyroidism 11/28/2013  . Gastroesophageal reflux disease without esophagitis 11/28/2013  . Bilateral low back  pain without sciatica 11/28/2013  . Other and unspecified hyperlipidemia 11/28/2013  . Unspecified constipation 11/28/2013  . Unspecified vitamin D deficiency 11/28/2013  . Labial infection 09/03/2013  . Candidiasis of vulva 09/03/2013  . Morbid obesity with BMI of 45.0-49.9, adult ([place]) 04/26/2013  . Unspecified hypothyroidism 04/11/2013  . Morbid obesity ([place]) 01/01/2013  . GRAVE'S DISEASE 01/30/2007  . HYPERLIPIDEMIA 01/30/2007  . OBESITY NOS 01/30/2007  . TOBACCO USE 01/30/2007  . SYMPTOM, DISTURBANCE, SLEEP NOS 01/30/2007  . HYPERGLYCEMIA 01/30/2007  . DIABETES MELLITUS, TYPE II 01/09/2007  . ANEMIA-NOS 01/09/2007  . DEPRESSION 01/09/2007  . COLONIC POLYPS, HX OF 01/09/2007    [facility] 06/27/2015, 11:26 AM  [facility] [address] [place], [place], [address] Phone: [phone]   Fax:  [phone]  Name: Tasha Pearson MRN: [id number] Date of Birth: [birth year]    Melvenia Needles, PTA 06/27/2015 11:26 AM Phone: [phone] Fax: [phone]

## 2015-06-29 ENCOUNTER — Ambulatory Visit: Payer: 59 | Attending: Neurosurgery | Admitting: Physical Therapy

## 2015-06-29 DIAGNOSIS — M542 Cervicalgia: Secondary | ICD-10-CM | POA: Diagnosis not present

## 2015-06-29 DIAGNOSIS — M6289 Other specified disorders of muscle: Secondary | ICD-10-CM

## 2015-06-29 DIAGNOSIS — R208 Other disturbances of skin sensation: Secondary | ICD-10-CM | POA: Diagnosis not present

## 2015-06-29 DIAGNOSIS — M5010 Cervical disc disorder with radiculopathy, unspecified cervical region: Secondary | ICD-10-CM | POA: Diagnosis not present

## 2015-06-29 DIAGNOSIS — G729 Myopathy, unspecified: Secondary | ICD-10-CM | POA: Diagnosis not present

## 2015-06-29 DIAGNOSIS — R29898 Other symptoms and signs involving the musculoskeletal system: Secondary | ICD-10-CM | POA: Diagnosis not present

## 2015-06-29 DIAGNOSIS — R209 Unspecified disturbances of skin sensation: Secondary | ICD-10-CM

## 2015-06-29 NOTE — Therapy (Signed)
[place], [place], [address] Phone: [phone]   Fax:  [phone]  Physical Therapy Treatment  Patient Details  Name: Tasha Pearson MRN: [id number] Date of Birth: June 05, 1960 Referring Provider: Vertell Limber  Encounter Date: 06/29/2015      PT End of Session - 06/29/15 1018    Visit Number 8   Number of Visits 16   Date for PT Re-Evaluation 07/20/15   PT Start Time 1013   PT Stop Time 1118   PT Time Calculation (min) 65 min   Activity Tolerance Patient tolerated treatment well   Behavior During Therapy [facility] for tasks assessed/performed      Past Medical History  Diagnosis Date  . Morbid obesity   . IBS (irritable bowel syndrome)     Pt reported on 01/18/13  . Hypercholesteremia   . Hypothyroidism   . H/O Graves' disease   . Depression   . Pneumonia 20's    hx of  . Headache(784.0)     rare  . Arthritis     feet  . PONV (postoperative nausea and vomiting)     severe vomiting  . Schwannoma     left ear    Past Surgical History  Procedure Laterality Date  . Cesarean section      x2  . Cholecystectomy    . Abdominal hysterectomy    . Spine surgery      fusion, nerve release, and replaced a disc in lower back, l5-s1  . Laparoscopic gastric sleeve resection N/A 04/26/2013    Procedure: LAPAROSCOPIC GASTRIC SLEEVE RESECTION;  Surgeon: Edward Jolly, MD;  Location: [facility];  Service: General;  Laterality: N/A;  . Upper gi endoscopy N/A 04/26/2013    Procedure: UPPER GI ENDOSCOPY;  Surgeon: Edward Jolly, MD;  Location: [facility];  Service: General;  Laterality: N/A;  . Liver surgery      There were no vitals filed for this visit.  Visit Diagnosis:  Cervicalgia  Muscle tightness  Sensory disturbance  Cervical disc disorder with radiculopathy of cervical region  Weakness of right arm      Subjective Assessment - 06/29/15 1017    Subjective I have good days and bad, overall I am  doing better. I think the needling really was the ticket.  Has symptoms in Rt. elbow and Rt. neck.shoulder   Currently in Pain? Yes   Pain Score 2             [facility] PT Assessment - 06/29/15 1032    AROM   Cervical Flexion 40   Cervical Extension 36   Cervical - Right Side Bend 28   Cervical - Left Side Bend 30   Cervical - Right Rotation 42   Cervical - Left Rotation 45   Strength   Right Shoulder Flexion 4+/5   Right Shoulder ABduction 4+/5   Left Shoulder Flexion 5/5   Left Shoulder ABduction 5/5   Right Elbow Flexion 4+/5   Right Elbow Extension 4/5   Left Elbow Flexion 5/5   Left Elbow Extension 4+/5                     [facility] Adult PT Treatment/Exercise - 06/29/15 1018    Neck Exercises: Standing   Neck Retraction 5 reps   Other Standing Exercises standing scap and cervical stabilization" overhead, horiz abd, sash x 10 each, ER x 10    Neck Exercises: Seated   Other Seated Exercise  neck isometrics rotation, sidebending, flexion and ext x 5 sec x 15-10 each    Shoulder Exercises: ROM/Strengthening   UBE (Upper Arm Bike) L 3, 3 min FW and 3 min back   Other ROM/Strengthening Exercises --   Traction   Type of Traction Cervical   Min (lbs) 7   Max (lbs) 15   Hold Time 60   Rest Time 15   Time 17   Manual Therapy   Manual Therapy Soft tissue mobilization;Myofascial release;Manual Traction   Soft tissue mobilization suboccipitals, gentle friction  upper traps and levator scap   Myofascial Release Rt. cervicals   Manual Traction cervical spine added L lateral flexion   Neck Exercises: Stretches   Upper Trapezius Stretch 2 reps;30 seconds   Levator Stretch 2 reps;30 seconds                PT Education - 06/29/15 1148    Education provided Yes   Education Details progress, HEP/isometrics   Person(s) Educated Patient   Methods Explanation;Demonstration;Tactile cues;Verbal cues;Handout   Comprehension Verbalized understanding;Returned  demonstration          PT Short Term Goals - 06/29/15 1030    PT SHORT TERM GOAL #1   Title Pt will be able to perform HEP with I for basic Cervical stab and posture   Status Achieved   PT SHORT TERM GOAL #2   Title Pt will be able to improve comfort/position of neck with sleeping, 15-25%    Status Achieved   PT SHORT TERM GOAL #3   Title Pt will report 25% less symptoms in hands, arms (centralization of pain) for better work performance   Status Achieved   PT SHORT TERM GOAL #4   Title Pt will demo neck AROM with min pain in neck    Status Achieved           PT Long Term Goals - 06/29/15 1026    PT LONG TERM GOAL #1   Title Pt will be I with concepts of posture, body mech and lifting to preent re-injury.    Status Achieved   PT LONG TERM GOAL #2   Title Pt will be I with more advanced HEP    Status On-going   PT LONG TERM GOAL #3   Title Pt will demo 5/5 strength in bilateral UEs throughout   Status On-going   PT LONG TERM GOAL #4   Title Pt will be able to demo C-AROM [facility] and no pain   Status On-going   PT LONG TERM GOAL #5   Title Pt will report min, occasional sx in UEs for improved sleep and daily activities    Status On-going   PT LONG TERM GOAL #6   Title Pt will score </=36% limited on FOTO to demo overall improvement   Status Unable to assess               Plan - 06/29/15 1156    Clinical Impression Statement Patient with improved pain and function, still with limited Rt. UE strength and Cervical AROM.  Goals met all STG and LTG #1. Trial of traction again today as she is feeling less tension in neck.    PT Next Visit Plan FOTO next visit.  supine neck isometrics, cont scap and cerv. stabilization ex. ,manual and see if traction helped   PT Home Exercise Plan continue stretching, stabilization with red bands and gave isometrics today   Consulted and Agree with Plan of Care  Patient        Problem List Patient Active Problem List   Diagnosis  Date Noted  . Iron deficiency anemia 11/28/2013  . Anxiety state, unspecified 11/28/2013  . Other specified acquired hypothyroidism 11/28/2013  . Gastroesophageal reflux disease without esophagitis 11/28/2013  . Bilateral low back pain without sciatica 11/28/2013  . Other and unspecified hyperlipidemia 11/28/2013  . Unspecified constipation 11/28/2013  . Unspecified vitamin D deficiency 11/28/2013  . Labial infection 09/03/2013  . Candidiasis of vulva 09/03/2013  . Morbid obesity with BMI of 45.0-49.9, adult ([place]) 04/26/2013  . Unspecified hypothyroidism 04/11/2013  . Morbid obesity ([place]) 01/01/2013  . GRAVE'S DISEASE 01/30/2007  . HYPERLIPIDEMIA 01/30/2007  . OBESITY NOS 01/30/2007  . TOBACCO USE 01/30/2007  . SYMPTOM, DISTURBANCE, SLEEP NOS 01/30/2007  . HYPERGLYCEMIA 01/30/2007  . DIABETES MELLITUS, TYPE II 01/09/2007  . ANEMIA-NOS 01/09/2007  . DEPRESSION 01/09/2007  . COLONIC POLYPS, HX OF 01/09/2007    Tasha Pearson 06/29/2015, 12:04 PM  [facility] [address] [place], [place], [address] Phone: [phone]   Fax:  [phone]  Name: Tasha Pearson MRN: [id number] Date of Birth: November 20, 1960   Raeford Razor, PT 06/29/2015 12:05 PM Phone: [phone] Fax: [phone]

## 2015-06-29 NOTE — Patient Instructions (Signed)
Gave patient cervical isometrics handout from file cabinet.

## 2015-07-05 ENCOUNTER — Ambulatory Visit: Payer: 59 | Admitting: Physical Therapy

## 2015-07-05 DIAGNOSIS — R208 Other disturbances of skin sensation: Secondary | ICD-10-CM | POA: Diagnosis not present

## 2015-07-05 DIAGNOSIS — M5010 Cervical disc disorder with radiculopathy, unspecified cervical region: Secondary | ICD-10-CM | POA: Diagnosis not present

## 2015-07-05 DIAGNOSIS — R209 Unspecified disturbances of skin sensation: Secondary | ICD-10-CM

## 2015-07-05 DIAGNOSIS — M6289 Other specified disorders of muscle: Secondary | ICD-10-CM

## 2015-07-05 DIAGNOSIS — M542 Cervicalgia: Secondary | ICD-10-CM | POA: Diagnosis not present

## 2015-07-05 DIAGNOSIS — R29898 Other symptoms and signs involving the musculoskeletal system: Secondary | ICD-10-CM

## 2015-07-05 DIAGNOSIS — G729 Myopathy, unspecified: Secondary | ICD-10-CM | POA: Diagnosis not present

## 2015-07-05 NOTE — Therapy (Signed)
[place], [place], [address] Phone: [phone]   Fax:  [phone]  Physical Therapy Treatment  Patient Details  Name: Tasha Pearson MRN: [id number] Date of Birth: [birth year] Referring Provider: Vertell Limber  Encounter Date: 07/05/2015      PT End of Session - 07/05/15 1335    Visit Number 9   Number of Visits 16   Date for PT Re-Evaluation 07/20/15   PT Start Time 7622   PT Stop Time 1350   PT Time Calculation (min) 47 min   Activity Tolerance Patient tolerated treatment well   Behavior During Therapy [facility] for tasks assessed/performed      Past Medical History  Diagnosis Date  . Morbid obesity   . IBS (irritable bowel syndrome)     Pt reported on 01/18/13  . Hypercholesteremia   . Hypothyroidism   . H/O Graves' disease   . Depression   . Pneumonia 20's    hx of  . Headache(784.0)     rare  . Arthritis     feet  . PONV (postoperative nausea and vomiting)     severe vomiting  . Schwannoma     left ear    Past Surgical History  Procedure Laterality Date  . Cesarean section      x2  . Cholecystectomy    . Abdominal hysterectomy    . Spine surgery      fusion, nerve release, and replaced a disc in lower back, l5-s1  . Laparoscopic gastric sleeve resection N/A 04/26/2013    Procedure: LAPAROSCOPIC GASTRIC SLEEVE RESECTION;  Surgeon: Edward Jolly, MD;  Location: [facility];  Service: General;  Laterality: N/A;  . Upper gi endoscopy N/A 04/26/2013    Procedure: UPPER GI ENDOSCOPY;  Surgeon: Edward Jolly, MD;  Location: [facility];  Service: General;  Laterality: N/A;  . Liver surgery      There were no vitals filed for this visit.  Visit Diagnosis:  Cervicalgia  Muscle tightness  Sensory disturbance  Cervical disc disorder with radiculopathy of cervical region  Weakness of right arm      Subjective Assessment - 07/05/15 1332    Subjective Symptoms have returned in Rt. UE  (neck, shoulder/triceps, elbow and hand) including pain and numbness at night and a little currently.  Eventually she remembered vacuuming and using steam cleaner Sunday, been working as well, lifting.     Currently in Pain? Yes   Pain Score 8    Pain Location Neck  see above   Pain Orientation Right   Pain Descriptors / Indicators Sore;Stabbing;Pins and needles;Numbness   Pain Type Chronic pain   Pain Radiating Towards Rt. arm and hand   Pain Onset More than a month ago   Pain Frequency Constant   Aggravating Factors  unsure, ;using her arm, lifting    Pain Relieving Factors PT was helping               PT Education - 07/05/15 1334    Education provided Yes   Education Details ice for more intense pain and inflammation   Person(s) Educated Patient   Methods Explanation   Comprehension Verbalized understanding         [place] Adult PT Treatment/Exercise - 07/05/15 1339    Modalities   Modalities Cryotherapy   Cryotherapy   Number Minutes Cryotherapy 15 Minutes   Cryotherapy Location Shoulder   Type of Cryotherapy Ice pack   Electrical Stimulation   Electrical  Stimulation Location R upper trap  shoulder and neck   Electrical Stimulation Goals Pain   Manual Therapy   Manual Therapy Soft tissue mobilization;Myofascial release;Manual Traction   Soft tissue mobilization suboccipitals, gentle friction  upper traps and levator scap   Myofascial Release Rt. cervicals   Manual Traction cervical spine added L lateral flexion  5 x 60 sec gentle, reduced R.t UE numbness          PT Short Term Goals - 07/05/15 1336    PT SHORT TERM GOAL #1   Title Pt will be able to perform HEP with I for basic Cervical stab and posture   Status Achieved   PT SHORT TERM GOAL #2   Title Pt will be able to improve comfort/position of neck with sleeping, 15-25%    Status Achieved   PT SHORT TERM GOAL #3   Title Pt will report 25% less symptoms in hands, arms (centralization of pain) for  better work performance   Baseline was not met today with return of sx   Status Achieved   PT SHORT TERM GOAL #4   Title Pt will demo neck AROM with min pain in neck    Baseline not met today   Status Achieved           PT Long Term Goals - 07/05/15 1337    PT LONG TERM GOAL #1   Title Pt will be I with concepts of posture, body mech and lifting to preent re-injury.    Status Achieved   PT LONG TERM GOAL #2   Title Pt will be I with more advanced HEP    Status On-going   PT LONG TERM GOAL #3   Title Pt will demo 5/5 strength in bilateral UEs throughout   Status On-going   PT LONG TERM GOAL #4   Title Pt will be able to demo C-AROM [facility] and no pain   Status On-going   PT LONG TERM GOAL #5   Title Pt will report min, occasional sx in UEs for improved sleep and daily activities    Status On-going   PT LONG TERM GOAL #6   Title Pt will score </=36% limited on FOTO to demo overall improvement   Status On-going               Plan - 07/05/15 1335    Clinical Impression Statement Patient has had increased pain for the past 2-3 days.  Has had to take 2 Tramadol today which she has not done in a long time.  session focused on relieving pain and using good body mechanics with ALDs, home tasks.     PT Next Visit Plan FOTO next visit.  supine neck isometrics, cont scap and cerv. stabilization ex. ,manual and see if traction helped, dry needle   PT Home Exercise Plan continue stretching, stabilization with red bands and gave isometrics today   Consulted and Agree with Plan of Care Patient     Much less pain and numbness after treatment, pt reports she will try ice later after working.    Problem List Patient Active Problem List   Diagnosis Date Noted  . Iron deficiency anemia 11/28/2013  . Anxiety state, unspecified 11/28/2013  . Other specified acquired hypothyroidism 11/28/2013  . Gastroesophageal reflux disease without esophagitis 11/28/2013  . Bilateral low back pain  without sciatica 11/28/2013  . Other and unspecified hyperlipidemia 11/28/2013  . Unspecified constipation 11/28/2013  . Unspecified vitamin D deficiency 11/28/2013  .  Labial infection 09/03/2013  . Candidiasis of vulva 09/03/2013  . Morbid obesity with BMI of 45.0-49.9, adult ([place]) 04/26/2013  . Unspecified hypothyroidism 04/11/2013  . Morbid obesity ([place]) 01/01/2013  . GRAVE'S DISEASE 01/30/2007  . HYPERLIPIDEMIA 01/30/2007  . OBESITY NOS 01/30/2007  . TOBACCO USE 01/30/2007  . SYMPTOM, DISTURBANCE, SLEEP NOS 01/30/2007  . HYPERGLYCEMIA 01/30/2007  . DIABETES MELLITUS, TYPE II 01/09/2007  . ANEMIA-NOS 01/09/2007  . DEPRESSION 01/09/2007  . COLONIC POLYPS, HX OF 01/09/2007    Oval Moralez 07/05/2015, 1:41 PM  [facility] [address] [place], [place], [address] Phone: [phone]   Fax:  [phone]  Name: Tasha Pearson MRN: [id number] Date of Birth: 07-Sep-1960  Raeford Razor, PT 07/05/2015 1:42 PM Phone: [phone] Fax: [phone]

## 2015-07-07 ENCOUNTER — Ambulatory Visit: Payer: 59 | Admitting: Physical Therapy

## 2015-07-07 DIAGNOSIS — R209 Unspecified disturbances of skin sensation: Secondary | ICD-10-CM

## 2015-07-07 DIAGNOSIS — R29898 Other symptoms and signs involving the musculoskeletal system: Secondary | ICD-10-CM | POA: Diagnosis not present

## 2015-07-07 DIAGNOSIS — M6289 Other specified disorders of muscle: Secondary | ICD-10-CM

## 2015-07-07 DIAGNOSIS — G729 Myopathy, unspecified: Secondary | ICD-10-CM | POA: Diagnosis not present

## 2015-07-07 DIAGNOSIS — M542 Cervicalgia: Secondary | ICD-10-CM

## 2015-07-07 DIAGNOSIS — R208 Other disturbances of skin sensation: Secondary | ICD-10-CM | POA: Diagnosis not present

## 2015-07-07 DIAGNOSIS — M5010 Cervical disc disorder with radiculopathy, unspecified cervical region: Secondary | ICD-10-CM

## 2015-07-07 NOTE — Therapy (Signed)
[place], [place], [address] Phone: [phone]   Fax:  [phone]  Physical Therapy Treatment  Patient Details  Name: Tasha Pearson MRN: [id number] Date of Birth: 11/14/1960 Referring Provider: Vertell Limber  Encounter Date: 07/07/2015      PT End of Session - 07/07/15 1111    Visit Number 10   Number of Visits 16   PT Start Time 1104   PT Stop Time 1155   PT Time Calculation (min) 51 min   Activity Tolerance Patient tolerated treatment well   Behavior During Therapy [facility] for tasks assessed/performed      Past Medical History  Diagnosis Date  . Morbid obesity   . IBS (irritable bowel syndrome)     Pt reported on 01/18/13  . Hypercholesteremia   . Hypothyroidism   . H/O Graves' disease   . Depression   . Pneumonia 20's    hx of  . Headache(784.0)     rare  . Arthritis     feet  . PONV (postoperative nausea and vomiting)     severe vomiting  . Schwannoma     left ear    Past Surgical History  Procedure Laterality Date  . Cesarean section      x2  . Cholecystectomy    . Abdominal hysterectomy    . Spine surgery      fusion, nerve release, and replaced a disc in lower back, l5-s1  . Laparoscopic gastric sleeve resection N/A 04/26/2013    Procedure: LAPAROSCOPIC GASTRIC SLEEVE RESECTION;  Surgeon: Edward Jolly, MD;  Location: [facility];  Service: General;  Laterality: N/A;  . Upper gi endoscopy N/A 04/26/2013    Procedure: UPPER GI ENDOSCOPY;  Surgeon: Edward Jolly, MD;  Location: [facility];  Service: General;  Laterality: N/A;  . Liver surgery      There were no vitals filed for this visit.  Visit Diagnosis:  Cervicalgia  Muscle tightness  Sensory disturbance  Cervical disc disorder with radiculopathy of cervical region  Weakness of right arm      Subjective Assessment - 07/07/15 1108    Subjective Pt feels better today, had someone at work massage her shoulder.  Only  woke up 1 time last night.    Currently in Pain? Yes   Pain Score 3    Pain Location Neck   Pain Orientation Right   Pain Descriptors / Indicators Sore   Pain Type Chronic pain   Pain Onset More than a month ago   Pain Frequency Constant                         [facility] Adult PT Treatment/Exercise - 07/07/15 1118    Neck Exercises: Supine   Shoulder Flexion 10 reps;Other reps (comment)   Shoulder Flexion Weights (lbs) alternating    Shoulder ABduction Both;10 reps;Other reps (comment)   Shoulder Abduction Weights (lbs) horiz abd    Other Supine Exercise supine Pilates Tower with [place] Arcs and T  protract and retract on foam roller x 10    Other Supine Exercise unilateral stab for scap and neck with slastix on foam roller    Shoulder Exercises: ROM/Strengthening   Other ROM/Strengthening Exercises supine core: dead bufg on foam roller x 10 each    Traction   Type of Traction Cervical   Min (lbs) 7   Max (lbs) 15   Hold Time 60   Rest Time  15   Time 17                  PT Short Term Goals - 07/05/15 1336    PT SHORT TERM GOAL #1   Title Pt will be able to perform HEP with I for basic Cervical stab and posture   Status Achieved   PT SHORT TERM GOAL #2   Title Pt will be able to improve comfort/position of neck with sleeping, 15-25%    Status Achieved   PT SHORT TERM GOAL #3   Title Pt will report 25% less symptoms in hands, arms (centralization of pain) for better work performance   Baseline was not met today with return of sx   Status Achieved   PT SHORT TERM GOAL #4   Title Pt will demo neck AROM with min pain in neck    Baseline not met today   Status Achieved           PT Long Term Goals - 07/05/15 1337    PT LONG TERM GOAL #1   Title Pt will be I with concepts of posture, body mech and lifting to preent re-injury.    Status Achieved   PT LONG TERM GOAL #2   Title Pt will be I with more advanced HEP    Status On-going   PT  LONG TERM GOAL #3   Title Pt will demo 5/5 strength in bilateral UEs throughout   Status On-going   PT LONG TERM GOAL #4   Title Pt will be able to demo C-AROM [facility] and no pain   Status On-going   PT LONG TERM GOAL #5   Title Pt will report min, occasional sx in UEs for improved sleep and daily activities    Status On-going   PT LONG TERM GOAL #6   Title Pt will score </=36% limited on FOTO to demo overall improvement   Status On-going               Plan - 07/07/15 1143    Clinical Impression Statement Pt back down to her baseline level of pain and numbness.  Able to work core without increased pain.  Lower ab weakness.  Returned to traction to stretch and relieve nerve pain.    PT Next Visit Plan check neck isometrics  cont scap and cerv. stabilization ex. ,manual and see if traction helped, dry needle   PT Home Exercise Plan continue stretching, stabilization with red bands and gave isometrics today   Consulted and Agree with Plan of Care Patient    Pt will likely cont after 07/20/14.  FOTO is 4% improved.     Problem List Patient Active Problem List   Diagnosis Date Noted  . Iron deficiency anemia 11/28/2013  . Anxiety state, unspecified 11/28/2013  . Other specified acquired hypothyroidism 11/28/2013  . Gastroesophageal reflux disease without esophagitis 11/28/2013  . Bilateral low back pain without sciatica 11/28/2013  . Other and unspecified hyperlipidemia 11/28/2013  . Unspecified constipation 11/28/2013  . Unspecified vitamin D deficiency 11/28/2013  . Labial infection 09/03/2013  . Candidiasis of vulva 09/03/2013  . Morbid obesity with BMI of 45.0-49.9, adult ([place]) 04/26/2013  . Unspecified hypothyroidism 04/11/2013  . Morbid obesity ([place]) 01/01/2013  . GRAVE'S DISEASE 01/30/2007  . HYPERLIPIDEMIA 01/30/2007  . OBESITY NOS 01/30/2007  . TOBACCO USE 01/30/2007  . SYMPTOM, DISTURBANCE, SLEEP NOS 01/30/2007  . HYPERGLYCEMIA 01/30/2007  . DIABETES MELLITUS,  TYPE II 01/09/2007  . ANEMIA-NOS 01/09/2007  .  DEPRESSION 01/09/2007  . COLONIC POLYPS, HX OF 01/09/2007    Christiann Hagerty 07/07/2015, 12:11 PM  [facility] [address] [place], [place], [address] Phone: [phone]   Fax:  [phone]  Name: MELAINIE KRINSKY MRN: [id number] Date of Birth: 11-23-1960    Raeford Razor, PT 07/07/2015 12:11 PM Phone: [phone] Fax: [phone]

## 2015-07-10 ENCOUNTER — Ambulatory Visit: Payer: 59 | Admitting: Physical Therapy

## 2015-07-10 DIAGNOSIS — R29898 Other symptoms and signs involving the musculoskeletal system: Secondary | ICD-10-CM | POA: Diagnosis not present

## 2015-07-10 DIAGNOSIS — M5010 Cervical disc disorder with radiculopathy, unspecified cervical region: Secondary | ICD-10-CM

## 2015-07-10 DIAGNOSIS — G729 Myopathy, unspecified: Secondary | ICD-10-CM | POA: Diagnosis not present

## 2015-07-10 DIAGNOSIS — R208 Other disturbances of skin sensation: Secondary | ICD-10-CM | POA: Diagnosis not present

## 2015-07-10 DIAGNOSIS — M542 Cervicalgia: Secondary | ICD-10-CM

## 2015-07-10 DIAGNOSIS — R209 Unspecified disturbances of skin sensation: Secondary | ICD-10-CM

## 2015-07-10 DIAGNOSIS — M6289 Other specified disorders of muscle: Secondary | ICD-10-CM

## 2015-07-10 NOTE — Therapy (Signed)
[place], [place], [address] Phone: [phone]   Fax:  [phone]  Physical Therapy Treatment  Patient Details  Name: Tasha Pearson MRN: [id number] Date of Birth: December 28, 1960 Referring Provider: Vertell Limber  Encounter Date: 07/10/2015      PT End of Session - 07/10/15 0854    Visit Number 11   Number of Visits 16   Date for PT Re-Evaluation 07/20/15   PT Start Time 0804   PT Stop Time 0912   PT Time Calculation (min) 68 min   Activity Tolerance Patient tolerated treatment well   Behavior During Therapy [facility] for tasks assessed/performed      Past Medical History  Diagnosis Date  . Morbid obesity   . IBS (irritable bowel syndrome)     Pt reported on 01/18/13  . Hypercholesteremia   . Hypothyroidism   . H/O Graves' disease   . Depression   . Pneumonia 20's    hx of  . Headache(784.0)     rare  . Arthritis     feet  . PONV (postoperative nausea and vomiting)     severe vomiting  . Schwannoma     left ear    Past Surgical History  Procedure Laterality Date  . Cesarean section      x2  . Cholecystectomy    . Abdominal hysterectomy    . Spine surgery      fusion, nerve release, and replaced a disc in lower back, l5-s1  . Laparoscopic gastric sleeve resection N/A 04/26/2013    Procedure: LAPAROSCOPIC GASTRIC SLEEVE RESECTION;  Surgeon: Edward Jolly, MD;  [facility];  Service: General;  Laterality: N/A;  . Upper gi endoscopy N/A 04/26/2013    Procedure: UPPER GI ENDOSCOPY;  Surgeon: Edward Jolly, MD;  [facility];  Service: General;  Laterality: N/A;  . Liver surgery      There were no vitals filed for this visit.  Visit Diagnosis:  Cervicalgia  Muscle tightness  Sensory disturbance  Cervical disc disorder with radiculopathy of cervical region  Weakness of right arm      Subjective Assessment - 07/10/15 0808    Subjective I took it easier over the weekend.   I still have a little numbness along arm into pinkie,  Arm pain intermittant. Traction helped   Currently in Pain? Yes   Pain Score 4    Pain Location Neck   Pain Orientation Right   Pain Descriptors / Indicators Sore;Numbness   Aggravating Factors  at night   Pain Relieving Factors Taking it easy                         [facility] Adult PT Treatment/Exercise - 07/10/15 0809    Self-Care   Self-Care --  posture  education during manual.    Neck Exercises: Supine   Other Supine Exercise 4 wAY isometrics 5 X 5 second manual resistance supine.    cervical   Traction   Type of Traction Cervical   Min (lbs) 8   Max (lbs) 18   Hold Time 60   Rest Time 15   Time 17   Manual Therapy   Manual Therapy Soft tissue mobilization  gentle stretching with strumming, less sensitive post manual   Soft tissue mobilization RT>LT   Myofascial Release RT sensitine initially superficial.  Uppre traps tight.  Soft marble shape noted in LT upper trap  PT Education - 07/10/15 0853    Education provided Yes   Education Details posture ed,  head posture   Person(s) Educated Patient   Methods Explanation   Comprehension Verbalized understanding          PT Short Term Goals - 07/05/15 1336    PT SHORT TERM GOAL #1   Title Pt will be able to perform HEP with I for basic Cervical stab and posture   Status Achieved   PT SHORT TERM GOAL #2   Title Pt will be able to improve comfort/position of neck with sleeping, 15-25%    Status Achieved   PT SHORT TERM GOAL #3   Title Pt will report 25% less symptoms in hands, arms (centralization of pain) for better work performance   Baseline was not met today with return of sx   Status Achieved   PT SHORT TERM GOAL #4   Title Pt will demo neck AROM with min pain in neck    Baseline not met today   Status Achieved           PT Long Term Goals - 07/10/15 0856    PT LONG TERM GOAL #1   Title Pt will be I with  concepts of posture, body mech and lifting to preent re-injury.    Time 8   Period Weeks   Status Achieved   PT LONG TERM GOAL #2   Title Pt will be I with more advanced HEP    Baseline ongoing   Time 8   Period Weeks   Status On-going   PT LONG TERM GOAL #3   Title Pt will demo 5/5 strength in bilateral UEs throughout   Time 8   Period Weeks   Status Unable to assess   PT LONG TERM GOAL #4   Title Pt will be able to demo C-AROM [facility] and no pain   Time 8   Period Weeks   Status Unable to assess   PT LONG TERM GOAL #5   Title Pt will report min, occasional sx in UEs for improved sleep and daily activities    Baseline SX continue intermittantly day and night   Time 8   Period Weeks   Status On-going               Plan - 07/10/15 0854    Clinical Impression Statement Arm pain decreased after manual.  Mechanical traction pound increased to 18 LBS. Patient is more aware of hunching over intermittantly during the day and she is able to correct.Arm pain intermittant.   PT Next Visit Plan continue isometrics, doorway stretch, traction as needed.   Consulted and Agree with Plan of Care Patient        Problem List Patient Active Problem List   Diagnosis Date Noted  . Iron deficiency anemia 11/28/2013  . Anxiety state, unspecified 11/28/2013  . Other specified acquired hypothyroidism 11/28/2013  . Gastroesophageal reflux disease without esophagitis 11/28/2013  . Bilateral low back pain without sciatica 11/28/2013  . Other and unspecified hyperlipidemia 11/28/2013  . Unspecified constipation 11/28/2013  . Unspecified vitamin D deficiency 11/28/2013  . Labial infection 09/03/2013  . Candidiasis of vulva 09/03/2013  . Morbid obesity with BMI of 45.0-49.9, adult ([place]) 04/26/2013  . Unspecified hypothyroidism 04/11/2013  . Morbid obesity ([place]) 01/01/2013  . GRAVE'S DISEASE 01/30/2007  . HYPERLIPIDEMIA 01/30/2007  . OBESITY NOS 01/30/2007  . TOBACCO USE 01/30/2007  .  SYMPTOM, DISTURBANCE, SLEEP NOS 01/30/2007  . HYPERGLYCEMIA  01/30/2007  . DIABETES MELLITUS, TYPE II 01/09/2007  . ANEMIA-NOS 01/09/2007  . DEPRESSION 01/09/2007  . COLONIC POLYPS, HX OF 01/09/2007    [facility] 07/10/2015, 9:01 AM  [facility] [address] [place], [place], [address] Phone: [phone]   Fax:  [phone]  Name: CHALISA KOBLER MRN: [id number] Date of Birth: 1960-07-03    Melvenia Needles, PTA 07/10/2015 9:01 AM Phone: [phone] Fax: [phone]

## 2015-07-13 ENCOUNTER — Ambulatory Visit: Payer: 59 | Admitting: Physical Therapy

## 2015-07-13 DIAGNOSIS — M5010 Cervical disc disorder with radiculopathy, unspecified cervical region: Secondary | ICD-10-CM

## 2015-07-13 DIAGNOSIS — R209 Unspecified disturbances of skin sensation: Secondary | ICD-10-CM

## 2015-07-13 DIAGNOSIS — M6289 Other specified disorders of muscle: Secondary | ICD-10-CM

## 2015-07-13 DIAGNOSIS — M542 Cervicalgia: Secondary | ICD-10-CM

## 2015-07-13 DIAGNOSIS — R29898 Other symptoms and signs involving the musculoskeletal system: Secondary | ICD-10-CM

## 2015-07-13 DIAGNOSIS — G729 Myopathy, unspecified: Secondary | ICD-10-CM | POA: Diagnosis not present

## 2015-07-13 DIAGNOSIS — R208 Other disturbances of skin sensation: Secondary | ICD-10-CM | POA: Diagnosis not present

## 2015-07-13 NOTE — Therapy (Signed)
[place], [place], [address] Phone: [phone]   Fax:  [phone]  Physical Therapy Treatment  Patient Details  Name: Tasha Pearson MRN: [id number] Date of Birth: 12-22-1960 Referring Provider: Vertell Limber  Encounter Date: 07/13/2015      PT End of Session - 07/13/15 0820    Visit Number 12   Number of Visits 16   Date for PT Re-Evaluation 07/20/15   PT Start Time 0730   PT Stop Time 0825   PT Time Calculation (min) 55 min   Activity Tolerance Patient tolerated treatment well   Behavior During Therapy [facility] for tasks assessed/performed      Past Medical History  Diagnosis Date  . Morbid obesity   . IBS (irritable bowel syndrome)     Pt reported on 01/18/13  . Hypercholesteremia   . Hypothyroidism   . H/O Graves' disease   . Depression   . Pneumonia 20's    hx of  . Headache(784.0)     rare  . Arthritis     feet  . PONV (postoperative nausea and vomiting)     severe vomiting  . Schwannoma     left ear    Past Surgical History  Procedure Laterality Date  . Cesarean section      x2  . Cholecystectomy    . Abdominal hysterectomy    . Spine surgery      fusion, nerve release, and replaced a disc in lower back, l5-s1  . Laparoscopic gastric sleeve resection N/A 04/26/2013    Procedure: LAPAROSCOPIC GASTRIC SLEEVE RESECTION;  Surgeon: Edward Jolly, MD;  [facility]: [facility];  Service: General;  Laterality: N/A;  . Upper gi endoscopy N/A 04/26/2013    Procedure: UPPER GI ENDOSCOPY;  Surgeon: Edward Jolly, MD;  Location: [facility];  Service: General;  Laterality: N/A;  . Liver surgery      There were no vitals filed for this visit.  Visit Diagnosis:  Cervicalgia  Muscle tightness  Sensory disturbance  Cervical disc disorder with radiculopathy of cervical region  Weakness of right arm      Subjective Assessment - 07/13/15 0731    Subjective Numbness tingling  RT hand.  Has  hard timeyesterday,  wearing lead apron 4 hours.     Pain Score 6    Pain Location Neck   Pain Orientation Right   Pain Descriptors / Indicators Aching;Tingling;Numbness  collar bone aches,  Scalenes  RT                         [facility] Adult PT Treatment/Exercise - 07/13/15 0729    Lumbar Exercises: Quadruped   Single Arm Raise 10 reps  cues initially   Straight Leg Raise 5 reps  cues   Opposite Arm/Leg Raise 5 reps  wobbles, cues   Traction   Min (lbs) 8   Max (lbs) 18   Hold Time 60   Rest Time 15   Time 17   Manual Therapy   Manual Therapy Soft tissue mobilization  stretching and strumming at end of neuromuscular release   Soft tissue mobilization RT>LT   Myofascial Release less sensitive today,  tissues continue to have tightness.                 PT Education - 07/13/15 0819    Education provided Yes   Education Details quadriped   Person(s) Educated Patient   Methods Explanation;Tactile cues;Verbal  cues;Handout   Comprehension Verbalized understanding;Returned demonstration          PT Short Term Goals - 07/05/15 1336    PT SHORT TERM GOAL #1   Title Pt will be able to perform HEP with I for basic Cervical stab and posture   Status Achieved   PT SHORT TERM GOAL #2   Title Pt will be able to improve comfort/position of neck with sleeping, 15-25%    Status Achieved   PT SHORT TERM GOAL #3   Title Pt will report 25% less symptoms in hands, arms (centralization of pain) for better work performance   Baseline was not met today with return of sx   Status Achieved   PT SHORT TERM GOAL #4   Title Pt will demo neck AROM with min pain in neck    Baseline not met today   Status Achieved           PT Long Term Goals - 07/13/15 0824    PT LONG TERM GOAL #1   Title Pt will be I with concepts of posture, body mech and lifting to preent re-injury.    Status Achieved   PT LONG TERM GOAL #2   Title Pt will be I with more advanced HEP     Baseline ongoing   Time 8   Period Weeks   Status On-going   PT LONG TERM GOAL #3   Title Pt will demo 5/5 strength in bilateral UEs throughout   Time 8   Period Weeks   Status Unable to assess   PT LONG TERM GOAL #4   Title Pt will be able to demo C-AROM [facility] and no pain   Baseline pain continues   Time 8   Period Weeks   Status On-going   PT LONG TERM GOAL #5   Title Pt will report min, occasional sx in UEs for improved sleep and daily activities    Baseline SX continue intermittantly day and night   Time 8   Period Weeks   Status On-going   PT LONG TERM GOAL #6   Title Pt will score </=36% limited on FOTO to demo overall improvement   Time 8   Period Weeks   Status Unable to assess               Plan - 07/13/15 4097    Clinical Impression Statement less pain post manual,  Progress toward home exerecise goals.  She is doing consistantly at home. Lead apron worn 4 hours weighs 20 LBS. This is a challange.  She is considering getting a light weight apron which should be helpful. Advanced bands to green, for home.   PT Next Visit Plan review quadriped.  continue to review posture.   PT Home Exercise Plan added quadriped   Consulted and Agree with Plan of Care Patient        Problem List Patient Active Problem List   Diagnosis Date Noted  . Iron deficiency anemia 11/28/2013  . Anxiety state, unspecified 11/28/2013  . Other specified acquired hypothyroidism 11/28/2013  . Gastroesophageal reflux disease without esophagitis 11/28/2013  . Bilateral low back pain without sciatica 11/28/2013  . Other and unspecified hyperlipidemia 11/28/2013  . Unspecified constipation 11/28/2013  . Unspecified vitamin D deficiency 11/28/2013  . Labial infection 09/03/2013  . Candidiasis of vulva 09/03/2013  . Morbid obesity with BMI of 45.0-49.9, adult ([place]) 04/26/2013  . Unspecified hypothyroidism 04/11/2013  . Morbid obesity ([place]) 01/01/2013  . GRAVE'S  DISEASE 01/30/2007  .  HYPERLIPIDEMIA 01/30/2007  . OBESITY NOS 01/30/2007  . TOBACCO USE 01/30/2007  . SYMPTOM, DISTURBANCE, SLEEP NOS 01/30/2007  . HYPERGLYCEMIA 01/30/2007  . DIABETES MELLITUS, TYPE II 01/09/2007  . ANEMIA-NOS 01/09/2007  . DEPRESSION 01/09/2007  . COLONIC POLYPS, HX OF 01/09/2007    [facility] 07/13/2015, 8:27 AM  [facility] [address] [place], [place], [address] Phone: [phone]   Fax:  [phone]  Name: Tasha Pearson MRN: [id number] Date of Birth: Jul 27, 1960    Melvenia Needles, PTA 07/13/2015 8:27 AM Phone: [phone] Fax: [phone]

## 2015-07-13 NOTE — Patient Instructions (Signed)
Issued from drawer Quadriped to be done daily 5-10 X each.  Arms,legs only , opposite arms/legs.  She can try hip abduction.

## 2015-07-18 ENCOUNTER — Ambulatory Visit: Payer: 59 | Admitting: Physical Therapy

## 2015-07-18 ENCOUNTER — Encounter: Payer: 59 | Admitting: Physical Therapy

## 2015-07-18 DIAGNOSIS — R208 Other disturbances of skin sensation: Secondary | ICD-10-CM | POA: Diagnosis not present

## 2015-07-18 DIAGNOSIS — M5010 Cervical disc disorder with radiculopathy, unspecified cervical region: Secondary | ICD-10-CM | POA: Diagnosis not present

## 2015-07-18 DIAGNOSIS — M542 Cervicalgia: Secondary | ICD-10-CM

## 2015-07-18 DIAGNOSIS — M6289 Other specified disorders of muscle: Secondary | ICD-10-CM

## 2015-07-18 DIAGNOSIS — R209 Unspecified disturbances of skin sensation: Secondary | ICD-10-CM

## 2015-07-18 DIAGNOSIS — R29898 Other symptoms and signs involving the musculoskeletal system: Secondary | ICD-10-CM | POA: Diagnosis not present

## 2015-07-18 DIAGNOSIS — G729 Myopathy, unspecified: Secondary | ICD-10-CM | POA: Diagnosis not present

## 2015-07-18 NOTE — Therapy (Signed)
[place], [place], [address] Phone: [phone]   Fax:  [phone]  Physical Therapy Treatment / Renewal   Patient Details  Name: Tasha Pearson MRN: [id number] Date of Birth: [birth year] Referring Provider: Vertell Limber  Encounter Date: 07/18/2015      PT End of Session - 07/18/15 1416    Visit Number 13   Number of Visits 17   Date for PT Re-Evaluation 08/22/15   PT Start Time 1418   PT Stop Time 1513   PT Time Calculation (min) 55 min   Activity Tolerance Patient tolerated treatment well   Behavior During Therapy [facility] for tasks assessed/performed      Past Medical History  Diagnosis Date  . Morbid obesity   . IBS (irritable bowel syndrome)     Pt reported on 01/18/13  . Hypercholesteremia   . Hypothyroidism   . H/O Graves' disease   . Depression   . Pneumonia 20's    hx of  . Headache(784.0)     rare  . Arthritis     feet  . PONV (postoperative nausea and vomiting)     severe vomiting  . Schwannoma     left ear    Past Surgical History  Procedure Laterality Date  . Cesarean section      x2  . Cholecystectomy    . Abdominal hysterectomy    . Spine surgery      fusion, nerve release, and replaced a disc in lower back, l5-s1  . Laparoscopic gastric sleeve resection N/A 04/26/2013    Procedure: LAPAROSCOPIC GASTRIC SLEEVE RESECTION;  Surgeon: Edward Jolly, MD;  Location: [facility];  Service: General;  Laterality: N/A;  . Upper gi endoscopy N/A 04/26/2013    Procedure: UPPER GI ENDOSCOPY;  Surgeon: Edward Jolly, MD;  Location: [facility];  Service: General;  Laterality: N/A;  . Liver surgery      There were no vitals filed for this visit.  Visit Diagnosis:  Cervicalgia - Plan: PT plan of care cert/re-cert  Muscle tightness - Plan: PT plan of care cert/re-cert  Sensory disturbance - Plan: PT plan of care cert/re-cert  Cervical disc disorder with radiculopathy of cervical region -  Plan: PT plan of care cert/re-cert  Weakness of right arm - Plan: PT plan of care cert/re-cert      Subjective Assessment - 07/18/15 1418    Subjective "I have had goo days and bad days, last night I had some bouts of numbness but better than it has been"   Currently in Pain? Yes   Pain Score 3    Pain Location Neck   Pain Orientation Right   Pain Descriptors / Indicators Tightness;Aching;Numbness   Pain Type Chronic pain   Pain Radiating Towards Rt. arm and hand   Pain Onset More than a month ago   Pain Frequency Intermittent   Aggravating Factors  at night    Pain Relieving Factors taking it easy            [facility] PT Assessment - 07/18/15 1424    Observation/Other Assessments   Focus on Therapeutic Outcomes (FOTO)  47% limited   AROM   Cervical Flexion 34   Cervical Extension 50   Cervical - Right Side Bend 28   Cervical - Left Side Bend 26   Cervical - Right Rotation 33   Cervical - Left Rotation 43   Strength   Right Shoulder Flexion 5/5   Right Shoulder  ABduction 5/5   Left Shoulder Flexion 5/5   Left Shoulder ABduction 5/5   Right Elbow Flexion 5/5   Right Elbow Extension 5/5   Left Elbow Flexion 5/5   Left Elbow Extension 5/5                     [facility] Adult PT Treatment/Exercise - 07/18/15 0001    Self-Care   Posture working on keeping the shoulder down during the day, using items that when she sees them to remind her to keep her shoulder down   Other Self-Care Comments  using theracane to work on manual trigger point relief and where she can find on   Neck Exercises: Supine   Other Supine Exercise supine thoracic extension over bolster 2 x 10   with arms behind head and keeping head in neutral   Manual Therapy   Manual Therapy Taping   Soft tissue mobilization IASTM of R upper trap   Myofascial Release R upper trap/ levator scap rolling and stretching   McConnell R upper trap inhibiton taping   Neck Exercises: Stretches   Upper Trapezius  Stretch 2 reps;30 seconds   Levator Stretch 2 reps;30 seconds          Trigger Point Dry Needling - 07/18/15 1442    Consent Given? Yes   Education Handout Provided Yes   Muscles Treated Upper Body Upper trapezius   Upper Trapezius Response Twitch reponse elicited;Palpable increased muscle length              PT Education - 07/18/15 1509    Education provided Yes   Education Details HEP review, upper trap inhibition taping benefit    Person(s) Educated Patient   Methods Explanation   Comprehension Verbalized understanding          PT Short Term Goals - 07/05/15 1336    PT SHORT TERM GOAL #1   Title Pt will be able to perform HEP with I for basic Cervical stab and posture   Status Achieved   PT SHORT TERM GOAL #2   Title Pt will be able to improve comfort/position of neck with sleeping, 15-25%    Status Achieved   PT SHORT TERM GOAL #3   Title Pt will report 25% less symptoms in hands, arms (centralization of pain) for better work performance   Baseline was not met today with return of sx   Status Achieved   PT SHORT TERM GOAL #4   Title Pt will demo neck AROM with min pain in neck    Baseline not met today   Status Achieved           PT Long Term Goals - 07/18/15 1435    PT LONG TERM GOAL #1   Title Pt will be I with concepts of posture, body mech and lifting to preent re-injury.    Time 8   Period Weeks   Status Achieved   PT LONG TERM GOAL #2   Title Pt will be I with more advanced HEP    Baseline ongoing   Time 8   Period Weeks   Status On-going   PT LONG TERM GOAL #3   Title Pt will demo 5/5 strength in bilateral UEs throughout   Baseline 5/5 no pain, during testing.   Time 8   Period Weeks   Status Achieved   PT LONG TERM GOAL #4   Title Pt will be able to demo C-AROM [facility] and no pain  Baseline increasing but still painful   Time 8   Period Weeks   Status Partially Met   PT LONG TERM GOAL #5   Title Pt will report min, occasional sx  in UEs for improved sleep and daily activities    Baseline SX continue intermittantly day and night   Time 8   Period Weeks   Status Partially Met   PT LONG TERM GOAL #6   Title Pt will score </=36% limited on FOTO to demo overall improvement   Time 8   Period Weeks   Status On-going               Plan - 07/18/15 1511    Clinical Impression Statement Resa reports doing better since the last session but still has intermittent pain inthe R shoulder. she has improved her cervical extension but has demonstrated reduce flexion, R rotation and L side bending. she has met LTG #3, and partially met LTG #4, and #5. TPDN of the R upper trap she demonstrated multiple palpable twtichens and she noted relief following IASTM.  educated on manual trigger point relief at home and working on keeping her shoulder down to facilitate proper posture. attempted trial of upper trap inhibition taping to prevent upper trap from activating which she reported no pain. utilized MHP following to decrease soreness from todays session. Plan to continue with current POC to transition to 1 x a week for next 4 weeks to work toward remaining goals.    Pt will benefit from skilled therapeutic intervention in order to improve on the following deficits Decreased range of motion;Increased fascial restricitons;Impaired UE functional use;Decreased activity tolerance;Pain;Impaired flexibility;Decreased mobility;Decreased strength;Impaired sensation;Postural dysfunction   Rehab Potential Good   PT Frequency 1x / week   PT Duration 4 weeks   PT Treatment/Interventions ADLs/Self Care Home Management;Ultrasound;Neuromuscular re-education;Cryotherapy;Electrical Stimulation;Moist Heat;Traction;Therapeutic exercise;Manual techniques;Therapeutic activities;Taping;Dry needling;Patient/family education;Passive range of motion   PT Next Visit Plan upper trap inhibition taping if beneficial, scapular stabilizers, thoracic mobility, manual  for upper trap tightness, modalities PRN   Consulted and Agree with Plan of Care Patient        Problem List Patient Active Problem List   Diagnosis Date Noted  . Iron deficiency anemia 11/28/2013  . Anxiety state, unspecified 11/28/2013  . Other specified acquired hypothyroidism 11/28/2013  . Gastroesophageal reflux disease without esophagitis 11/28/2013  . Bilateral low back pain without sciatica 11/28/2013  . Other and unspecified hyperlipidemia 11/28/2013  . Unspecified constipation 11/28/2013  . Unspecified vitamin D deficiency 11/28/2013  . Labial infection 09/03/2013  . Candidiasis of vulva 09/03/2013  . Morbid obesity with BMI of 45.0-49.9, adult ([place]) 04/26/2013  . Unspecified hypothyroidism 04/11/2013  . Morbid obesity ([place]) 01/01/2013  . GRAVE'S DISEASE 01/30/2007  . HYPERLIPIDEMIA 01/30/2007  . OBESITY NOS 01/30/2007  . TOBACCO USE 01/30/2007  . SYMPTOM, DISTURBANCE, SLEEP NOS 01/30/2007  . HYPERGLYCEMIA 01/30/2007  . DIABETES MELLITUS, TYPE II 01/09/2007  . ANEMIA-NOS 01/09/2007  . DEPRESSION 01/09/2007  . COLONIC POLYPS, HX OF 01/09/2007   Starr Lake PT, DPT, LAT, ATC  07/18/2015  4:02 PM      [place] [facility] [address] [place], [place], [address] Phone: [phone]   Fax:  [phone]  Name: NAVIE LAMOREAUX MRN: [id number] Date of Birth: 1960/05/16

## 2015-07-21 ENCOUNTER — Ambulatory Visit: Payer: 59 | Admitting: Physical Therapy

## 2015-07-21 DIAGNOSIS — R208 Other disturbances of skin sensation: Secondary | ICD-10-CM | POA: Diagnosis not present

## 2015-07-21 DIAGNOSIS — R209 Unspecified disturbances of skin sensation: Secondary | ICD-10-CM

## 2015-07-21 DIAGNOSIS — M5010 Cervical disc disorder with radiculopathy, unspecified cervical region: Secondary | ICD-10-CM | POA: Diagnosis not present

## 2015-07-21 DIAGNOSIS — R29898 Other symptoms and signs involving the musculoskeletal system: Secondary | ICD-10-CM

## 2015-07-21 DIAGNOSIS — M542 Cervicalgia: Secondary | ICD-10-CM

## 2015-07-21 DIAGNOSIS — M6289 Other specified disorders of muscle: Secondary | ICD-10-CM

## 2015-07-21 DIAGNOSIS — G729 Myopathy, unspecified: Secondary | ICD-10-CM | POA: Diagnosis not present

## 2015-07-21 NOTE — Therapy (Signed)
[place], [place], [address] Phone: [phone]   Fax:  [phone]  Physical Therapy Treatment  Patient Details  Name: Tasha Pearson MRN: [id number] Date of Birth: [birth year] Referring Provider: Vertell Limber  Encounter Date: 07/21/2015      PT End of Session - 07/21/15 0802    Visit Number 14   Number of Visits 17   Date for PT Re-Evaluation 08/22/15   PT Start Time 0800   PT Stop Time 0846   PT Time Calculation (min) 46 min   Activity Tolerance Patient tolerated treatment well   Behavior During Therapy [facility] for tasks assessed/performed      Past Medical History  Diagnosis Date  . Morbid obesity   . IBS (irritable bowel syndrome)     Pt reported on 01/18/13  . Hypercholesteremia   . Hypothyroidism   . H/O Graves' disease   . Depression   . Pneumonia 20's    hx of  . Headache(784.0)     rare  . Arthritis     feet  . PONV (postoperative nausea and vomiting)     severe vomiting  . Schwannoma     left ear    Past Surgical History  Procedure Laterality Date  . Cesarean section      x2  . Cholecystectomy    . Abdominal hysterectomy    . Spine surgery      fusion, nerve release, and replaced a disc in lower back, l5-s1  . Laparoscopic gastric sleeve resection N/A 04/26/2013    Procedure: LAPAROSCOPIC GASTRIC SLEEVE RESECTION;  Surgeon: Edward Jolly, MD;  Location: [facility];  Service: General;  Laterality: N/A;  . Upper gi endoscopy N/A 04/26/2013    Procedure: UPPER GI ENDOSCOPY;  Surgeon: Edward Jolly, MD;  Location: [facility];  Service: General;  Laterality: N/A;  . Liver surgery      There were no vitals filed for this visit.  Visit Diagnosis:  Cervicalgia  Muscle tightness  Sensory disturbance  Cervical disc disorder with radiculopathy of cervical region  Weakness of right arm      Subjective Assessment - 07/21/15 0806    Subjective Really sore after needling but it  helps so much.  LIked the tape.     Currently in Pain? Yes   Pain Score 3    Pain Location Neck   Pain Orientation Right   Pain Descriptors / Indicators Sore   Pain Type Chronic pain   Pain Onset More than a month ago   Pain Frequency Intermittent             [facility] Adult PT Treatment/Exercise - 07/21/15 0808    Neck Exercises: Supine   Other Supine Exercise Supine Reformer: supine arms arcs, circles.  Supine pull ups over and uderhand grip red spring x 8-10 reps    Other Supine Exercise Long box Reformer: overhead press 1 Red double and single arms for scapular stabilization    Manual Therapy   Manual Therapy Taping   Myofascial Release R upper trap/ levator scap rolling and stretching  Rt. rhomaboid, very painful and pt guarded, withdraws   McConnell R upper trap inhibiton taping                PT Education - 07/21/15 0831    Education provided Yes          PT Short Term Goals - 07/05/15 1336    PT SHORT TERM GOAL #1  Title Pt will be able to perform HEP with I for basic Cervical stab and posture   Status Achieved   PT SHORT TERM GOAL #2   Title Pt will be able to improve comfort/position of neck with sleeping, 15-25%    Status Achieved   PT SHORT TERM GOAL #3   Title Pt will report 25% less symptoms in hands, arms (centralization of pain) for better work performance   Baseline was not met today with return of sx   Status Achieved   PT SHORT TERM GOAL #4   Title Pt will demo neck AROM with min pain in neck    Baseline not met today   Status Achieved           PT Long Term Goals - 07/18/15 1435    PT LONG TERM GOAL #1   Title Pt will be I with concepts of posture, body mech and lifting to preent re-injury.    Time 8   Period Weeks   Status Achieved   PT LONG TERM GOAL #2   Title Pt will be I with more advanced HEP    Baseline ongoing   Time 8   Period Weeks   Status On-going   PT LONG TERM GOAL #3   Title Pt will demo 5/5 strength in  bilateral UEs throughout   Baseline 5/5 no pain, during testing.   Time 8   Period Weeks   Status Achieved   PT LONG TERM GOAL #4   Title Pt will be able to demo C-AROM [facility] and no pain   Baseline increasing but still painful   Time 8   Period Weeks   Status Partially Met   PT LONG TERM GOAL #5   Title Pt will report min, occasional sx in UEs for improved sleep and daily activities    Baseline SX continue intermittantly day and night   Time 8   Period Weeks   Status Partially Met   PT LONG TERM GOAL #6   Title Pt will score </=36% limited on FOTO to demo overall improvement   Time 8   Period Weeks   Status On-going               Plan - 07/21/15 0945    Clinical Impression Statement Addressed scapular stabilization today with PIlates Reformer.  She cont to have numbness which limits her mostly in the nighttime hours.  She had notable Rt. UE weakness with prone exercises, felt a "burn" but and fatigue in post neck mm.    Pt will benefit from skilled therapeutic intervention in order to improve on the following deficits Decreased range of motion;Increased fascial restricitons;Impaired UE functional use;Decreased activity tolerance;Pain;Impaired flexibility;Decreased mobility;Decreased strength;Impaired sensation;Postural dysfunction   PT Next Visit Plan upper trap inhibition taping if beneficial, scapular stabilizers, thoracic mobility, manual for upper trap tightness, modalities PRN   PT Home Exercise Plan added quadriped   Consulted and Agree with Plan of Care Patient        Problem List Patient Active Problem List   Diagnosis Date Noted  . Iron deficiency anemia 11/28/2013  . Anxiety state, unspecified 11/28/2013  . Other specified acquired hypothyroidism 11/28/2013  . Gastroesophageal reflux disease without esophagitis 11/28/2013  . Bilateral low back pain without sciatica 11/28/2013  . Other and unspecified hyperlipidemia 11/28/2013  . Unspecified constipation  11/28/2013  . Unspecified vitamin D deficiency 11/28/2013  . Labial infection 09/03/2013  . Candidiasis of vulva 09/03/2013  . Morbid obesity  with BMI of 45.0-49.9, adult ([place]) 04/26/2013  . Unspecified hypothyroidism 04/11/2013  . Morbid obesity ([place]) 01/01/2013  . GRAVE'S DISEASE 01/30/2007  . HYPERLIPIDEMIA 01/30/2007  . OBESITY NOS 01/30/2007  . TOBACCO USE 01/30/2007  . SYMPTOM, DISTURBANCE, SLEEP NOS 01/30/2007  . HYPERGLYCEMIA 01/30/2007  . DIABETES MELLITUS, TYPE II 01/09/2007  . ANEMIA-NOS 01/09/2007  . DEPRESSION 01/09/2007  . COLONIC POLYPS, HX OF 01/09/2007    PAA,JENNIFER 07/21/2015, 9:49 AM  [facility] [address] [place], [place], [address] Phone: [phone]   Fax:  [phone]  Name: Tasha Pearson MRN: [id number] Date of Birth: [birth year]    Raeford Razor, PT 07/21/2015 9:50 AM Phone: [phone] Fax: [phone]

## 2015-07-24 ENCOUNTER — Ambulatory Visit: Payer: 59 | Admitting: Physical Therapy

## 2015-07-24 DIAGNOSIS — M5010 Cervical disc disorder with radiculopathy, unspecified cervical region: Secondary | ICD-10-CM | POA: Diagnosis not present

## 2015-07-24 DIAGNOSIS — M542 Cervicalgia: Secondary | ICD-10-CM | POA: Diagnosis not present

## 2015-07-24 DIAGNOSIS — R209 Unspecified disturbances of skin sensation: Secondary | ICD-10-CM

## 2015-07-24 DIAGNOSIS — R208 Other disturbances of skin sensation: Secondary | ICD-10-CM | POA: Diagnosis not present

## 2015-07-24 DIAGNOSIS — R29898 Other symptoms and signs involving the musculoskeletal system: Secondary | ICD-10-CM

## 2015-07-24 DIAGNOSIS — G729 Myopathy, unspecified: Secondary | ICD-10-CM | POA: Diagnosis not present

## 2015-07-24 DIAGNOSIS — M6289 Other specified disorders of muscle: Secondary | ICD-10-CM

## 2015-07-24 NOTE — Therapy (Signed)
[place], [place], [address] Phone: [phone]   Fax:  [phone]  Physical Therapy Treatment  Patient Details  Name: Tasha Pearson MRN: [id number] Date of Birth: [birth year] Referring Provider: Vertell Limber  Encounter Date: 07/24/2015      PT End of Session - 07/24/15 0916    Visit Number 15   Number of Visits 17   Date for PT Re-Evaluation 08/22/15   PT Start Time 0800   PT Stop Time 0848   PT Time Calculation (min) 48 min   Activity Tolerance Patient tolerated treatment well   Behavior During Therapy [facility] for tasks assessed/performed      Past Medical History  Diagnosis Date  . Morbid obesity   . IBS (irritable bowel syndrome)     Pt reported on 01/18/13  . Hypercholesteremia   . Hypothyroidism   . H/O Graves' disease   . Depression   . Pneumonia 20's    hx of  . Headache(784.0)     rare  . Arthritis     feet  . PONV (postoperative nausea and vomiting)     severe vomiting  . Schwannoma     left ear    Past Surgical History  Procedure Laterality Date  . Cesarean section      x2  . Cholecystectomy    . Abdominal hysterectomy    . Spine surgery      fusion, nerve release, and replaced a disc in lower back, l5-s1  . Laparoscopic gastric sleeve resection N/A 04/26/2013    Procedure: LAPAROSCOPIC GASTRIC SLEEVE RESECTION;  Surgeon: Edward Jolly, MD;  Location: [facility];  Service: General;  Laterality: N/A;  . Upper gi endoscopy N/A 04/26/2013    Procedure: UPPER GI ENDOSCOPY;  Surgeon: Edward Jolly, MD;  Location: [facility];  Service: General;  Laterality: N/A;  . Liver surgery      There were no vitals filed for this visit.  Visit Diagnosis:  Cervicalgia  Muscle tightness  Sensory disturbance  Weakness of right arm  Cervical disc disorder with radiculopathy of cervical region      Subjective Assessment - 07/24/15 0803    Subjective "Just a little soreness in the  shoulder, still having numbness and tingling in the hand thats the same"   Currently in Pain? No/denies                         [facility] Adult PT Treatment/Exercise - 07/24/15 0922    Neck Exercises: Seated   Other Seated Exercise thoracic extension over the back of the chair 2 x 10   Moist Heat Therapy   Number Minutes Moist Heat 10 Minutes   Moist Heat Location Cervical;Shoulder   Manual Therapy   Manual therapy comments manual trigger point release x 3 in R upper trap   Joint Mobilization cervical c3-c7 P>A  give with movement as HEP   Soft tissue mobilization IASTM of R upper trap   Myofascial Release R upper trap/ levator scap rolling and stretching   McConnell R upper trap inhibiton taping   Neck Exercises: Stretches   Upper Trapezius Stretch 2 reps;30 seconds   Levator Stretch 2 reps;30 seconds                PT Education - 07/24/15 0916    Education provided Yes   Education Details cervical mobs with movement   Person(s) Educated Patient   Methods Explanation  Comprehension Verbalized understanding          PT Short Term Goals - 07/05/15 1336    PT SHORT TERM GOAL #1   Title Pt will be able to perform HEP with I for basic Cervical stab and posture   Status Achieved   PT SHORT TERM GOAL #2   Title Pt will be able to improve comfort/position of neck with sleeping, 15-25%    Status Achieved   PT SHORT TERM GOAL #3   Title Pt will report 25% less symptoms in hands, arms (centralization of pain) for better work performance   Baseline was not met today with return of sx   Status Achieved   PT SHORT TERM GOAL #4   Title Pt will demo neck AROM with min pain in neck    Baseline not met today   Status Achieved           PT Long Term Goals - 07/18/15 1435    PT LONG TERM GOAL #1   Title Pt will be I with concepts of posture, body mech and lifting to preent re-injury.    Time 8   Period Weeks   Status Achieved   PT LONG TERM GOAL #2    Title Pt will be I with more advanced HEP    Baseline ongoing   Time 8   Period Weeks   Status On-going   PT LONG TERM GOAL #3   Title Pt will demo 5/5 strength in bilateral UEs throughout   Baseline 5/5 no pain, during testing.   Time 8   Period Weeks   Status Achieved   PT LONG TERM GOAL #4   Title Pt will be able to demo C-AROM [facility] and no pain   Baseline increasing but still painful   Time 8   Period Weeks   Status Partially Met   PT LONG TERM GOAL #5   Title Pt will report min, occasional sx in UEs for improved sleep and daily activities    Baseline SX continue intermittantly day and night   Time 8   Period Weeks   Status Partially Met   PT LONG TERM GOAL #6   Title Pt will score </=36% limited on FOTO to demo overall improvement   Time 8   Period Weeks   Status On-going               Plan - 07/24/15 0917    Clinical Impression Statement Tasha Pearson reported that she is doing better today with pain but continues to have tingling in the L hand. Following manual trigger point release, IASTM, and inhibition taping she reported no tingling in the hand. Following cervical mobs she reported no tightness in the shoulder except for 1 spot, gave cerivcal mobs with movement as HEP. Used MHP following todays to decreased soreness from todays exercises.    PT Next Visit Plan upper trap inhibition taping if beneficial, scapular stabilizers, thoracic mobility, manual for upper trap tightness, modalities PRN   PT Home Exercise Plan cervical mobs with movement   Consulted and Agree with Plan of Care Patient        Problem List Patient Active Problem List   Diagnosis Date Noted  . Iron deficiency anemia 11/28/2013  . Anxiety state, unspecified 11/28/2013  . Other specified acquired hypothyroidism 11/28/2013  . Gastroesophageal reflux disease without esophagitis 11/28/2013  . Bilateral low back pain without sciatica 11/28/2013  . Other and unspecified hyperlipidemia 11/28/2013  .  Unspecified constipation  11/28/2013  . Unspecified vitamin D deficiency 11/28/2013  . Labial infection 09/03/2013  . Candidiasis of vulva 09/03/2013  . Morbid obesity with BMI of 45.0-49.9, adult ([place]) 04/26/2013  . Unspecified hypothyroidism 04/11/2013  . Morbid obesity ([place]) 01/01/2013  . GRAVE'S DISEASE 01/30/2007  . HYPERLIPIDEMIA 01/30/2007  . OBESITY NOS 01/30/2007  . TOBACCO USE 01/30/2007  . SYMPTOM, DISTURBANCE, SLEEP NOS 01/30/2007  . HYPERGLYCEMIA 01/30/2007  . DIABETES MELLITUS, TYPE II 01/09/2007  . ANEMIA-NOS 01/09/2007  . DEPRESSION 01/09/2007  . COLONIC POLYPS, HX OF 01/09/2007   Starr Lake PT, DPT, LAT, ATC  07/24/2015  9:25 AM    [place] [facility] [address] [place], [place], [address] Phone: [phone]   Fax:  [phone]  Name: TRIS HOWELL MRN: [id number] Date of Birth: 13-Feb-1961

## 2015-07-26 ENCOUNTER — Ambulatory Visit: Payer: 59 | Admitting: Physical Therapy

## 2015-07-26 DIAGNOSIS — R29898 Other symptoms and signs involving the musculoskeletal system: Secondary | ICD-10-CM

## 2015-07-26 DIAGNOSIS — R208 Other disturbances of skin sensation: Secondary | ICD-10-CM | POA: Diagnosis not present

## 2015-07-26 DIAGNOSIS — R209 Unspecified disturbances of skin sensation: Secondary | ICD-10-CM

## 2015-07-26 DIAGNOSIS — M6289 Other specified disorders of muscle: Secondary | ICD-10-CM

## 2015-07-26 DIAGNOSIS — M542 Cervicalgia: Secondary | ICD-10-CM

## 2015-07-26 DIAGNOSIS — G729 Myopathy, unspecified: Secondary | ICD-10-CM | POA: Diagnosis not present

## 2015-07-26 DIAGNOSIS — M5010 Cervical disc disorder with radiculopathy, unspecified cervical region: Secondary | ICD-10-CM | POA: Diagnosis not present

## 2015-07-26 NOTE — Patient Instructions (Signed)
T,Y thumbs up and out issued from exercise drawer.  3 X a week.  10 X each

## 2015-07-26 NOTE — Therapy (Addendum)
[place], [place], [address] Phone: [phone]   Fax:  [phone]  Physical Therapy Treatment, Discharge (Addended)  Patient Details  Name: Tasha Pearson MRN: [id number] Date of Birth: 1960/08/25 Referring Provider: Vertell Limber  Encounter Date: 07/26/2015      PT End of Session - 07/26/15 1834    Visit Number 16   Number of Visits 17   Date for PT Re-Evaluation 08/22/15   PT Start Time 0732   PT Stop Time 0820   PT Time Calculation (min) 48 min   Activity Tolerance Patient tolerated treatment well   Behavior During Therapy [facility] for tasks assessed/performed      Past Medical History  Diagnosis Date  . Morbid obesity   . IBS (irritable bowel syndrome)     Pt reported on 01/18/13  . Hypercholesteremia   . Hypothyroidism   . H/O Graves' disease   . Depression   . Pneumonia 20's    hx of  . Headache(784.0)     rare  . Arthritis     feet  . PONV (postoperative nausea and vomiting)     severe vomiting  . Schwannoma     left ear    Past Surgical History  Procedure Laterality Date  . Cesarean section      x2  . Cholecystectomy    . Abdominal hysterectomy    . Spine surgery      fusion, nerve release, and replaced a disc in lower back, l5-s1  . Laparoscopic gastric sleeve resection N/A 04/26/2013    Procedure: LAPAROSCOPIC GASTRIC SLEEVE RESECTION;  Surgeon: Edward Jolly, MD;  Location: [facility];  Service: General;  Laterality: N/A;  . Upper gi endoscopy N/A 04/26/2013    Procedure: UPPER GI ENDOSCOPY;  Surgeon: Edward Jolly, MD;  Location: [facility];  Service: General;  Laterality: N/A;  . Liver surgery      There were no vitals filed for this visit.  Visit Diagnosis:  Cervicalgia  Muscle tightness  Sensory disturbance  Weakness of right arm  Cervical disc disorder with radiculopathy of cervical region      Subjective Assessment - 07/26/15 0734    Subjective Numbness is  getting more frequent and lasting longer.   She is doing her exercises. Took tape off this morning, This helped clavicle pain and is a reminder.     Currently in Pain? No/denies   Pain Location Arm   Pain Orientation Right   Pain Descriptors / Indicators Tingling;Numbness   Pain Radiating Towards thumb and to ring finger   Pain Frequency Intermittent   Aggravating Factors  typing, night, driving   Pain Relieving Factors tape, rest                         [facility] Adult PT Treatment/Exercise - 07/26/15 0001    Neck Exercises: Standing   Other Standing Exercises T X10,Y X10,  scapular movements not equal, T with thumbs out X 10   Shoulder Exercises: Prone   Other Prone Exercises T,Y prone arm lifts with 2 thumb positions 10 X each.  Scapular motions asymetrical.  No pain   Moist Heat Therapy   Number Minutes Moist Heat 15 Minutes   Moist Heat Location Cervical  placed with neck flexion   Manual Therapy   McConnell R upper trap inhibiton taping   Neck Exercises: Stretches   Other Neck Stretches self snag for neck flexion  PT Education - 07/26/15 [phone]    Education provided Yes   Education Details self snag, flexion          PT Short Term Goals - 07/05/15 1336    PT SHORT TERM GOAL #1   Title Pt will be able to perform HEP with I for basic Cervical stab and posture   Status Achieved   PT SHORT TERM GOAL #2   Title Pt will be able to improve comfort/position of neck with sleeping, 15-25%    Status Achieved   PT SHORT TERM GOAL #3   Title Pt will report 25% less symptoms in hands, arms (centralization of pain) for better work performance   Baseline was not met today with return of sx   Status Achieved   PT SHORT TERM GOAL #4   Title Pt will demo neck AROM with min pain in neck    Baseline not met today   Status Achieved           PT Long Term Goals - 07/26/15 1839    PT LONG TERM GOAL #1   Title Pt will be I with concepts of  posture, body mech and lifting to preent re-injury.    Time 8   Period Weeks   Status Achieved   PT LONG TERM GOAL #2   Title Pt will be I with more advanced HEP    Baseline able to be independent with those issued so far.   Time 8   Period Weeks   Status On-going   PT LONG TERM GOAL #3   Title Pt will demo 5/5 strength in bilateral UEs throughout   Time 8   Period Weeks   Status Achieved   PT LONG TERM GOAL #4   Title Pt will be able to demo C-AROM [facility] and no pain   Time 8   Period Weeks   Status Unable to assess   PT LONG TERM GOAL #5   Title Pt will report min, occasional sx in UEs for improved sleep and daily activities    Baseline worsening   Time 8   Period Weeks   Status On-going               Plan - 07/26/15 1834    Clinical Impression Statement Tape helpful,  Progressed her home exercises.  Consulted with PT Carlus Pavlov about  tingling/ numbness getting more frequant and lasting longer.  Grip RT average 21 LBS,  LT 32   PT Next Visit Plan upper trap inhibition taping if beneficial, scapular stabilizers, thoracic mobility, manual for upper trap tightness, modalities PRN   PT Home Exercise Plan T, Y's prone   Consulted and Agree with Plan of Care Patient        Problem List Patient Active Problem List   Diagnosis Date Noted  . Iron deficiency anemia 11/28/2013  . Anxiety state, unspecified 11/28/2013  . Other specified acquired hypothyroidism 11/28/2013  . Gastroesophageal reflux disease without esophagitis 11/28/2013  . Bilateral low back pain without sciatica 11/28/2013  . Other and unspecified hyperlipidemia 11/28/2013  . Unspecified constipation 11/28/2013  . Unspecified vitamin D deficiency 11/28/2013  . Labial infection 09/03/2013  . Candidiasis of vulva 09/03/2013  . Morbid obesity with BMI of 45.0-49.9, adult ([place]) 04/26/2013  . Unspecified hypothyroidism 04/11/2013  . Morbid obesity ([place]) 01/01/2013  . GRAVE'S DISEASE 01/30/2007  .  HYPERLIPIDEMIA 01/30/2007  . OBESITY NOS 01/30/2007  . TOBACCO USE 01/30/2007  . SYMPTOM, DISTURBANCE, SLEEP NOS  01/30/2007  . HYPERGLYCEMIA 01/30/2007  . DIABETES MELLITUS, TYPE II 01/09/2007  . ANEMIA-NOS 01/09/2007  . DEPRESSION 01/09/2007  . COLONIC POLYPS, HX OF 01/09/2007    [facility] 07/26/2015, 6:41 PM  [facility] [address] [place], [place], [address] Phone: [phone]   Fax:  [phone]  Name: Tasha Pearson MRN: [id number] Date of Birth: 1961/01/30    Melvenia Needles, PTA 07/26/2015 6:41 PM Phone: [phone] Fax: [phone]   PHYSICAL THERAPY DISCHARGE SUMMARY  Visits from Start of Care: 16  Current functional level related to goals / functional outcomes: See above for most recent info    Remaining deficits: As of last visit, numbnes and weakness continued with intermittent neck and shoulder pain.    Education / Equipment: HEP, posture, lifting, core strength  Plan: Patient agrees to discharge.  Patient goals were partially met. Patient is being discharged due to not returning since the last visit.  ?????   Did not return phone calls and had several no shows.   Raeford Razor, PT 08/16/2015 8:31 AM Phone: [phone] Fax: [phone]

## 2015-07-31 ENCOUNTER — Encounter: Payer: 59 | Admitting: Physical Therapy

## 2015-08-03 ENCOUNTER — Ambulatory Visit: Payer: 59 | Admitting: Physical Therapy

## 2015-08-08 ENCOUNTER — Ambulatory Visit: Payer: 59 | Attending: Neurosurgery | Admitting: Physical Therapy

## 2015-08-10 ENCOUNTER — Ambulatory Visit: Payer: 59 | Admitting: Physical Therapy

## 2015-08-14 ENCOUNTER — Ambulatory Visit: Payer: 59 | Admitting: Physical Therapy

## 2015-08-15 DIAGNOSIS — E890 Postprocedural hypothyroidism: Secondary | ICD-10-CM | POA: Diagnosis not present

## 2015-08-16 ENCOUNTER — Ambulatory Visit: Payer: 59 | Admitting: Physical Therapy

## 2015-08-22 ENCOUNTER — Ambulatory Visit (INDEPENDENT_AMBULATORY_CARE_PROVIDER_SITE_OTHER): Payer: 59 | Admitting: Gastroenterology

## 2015-08-22 ENCOUNTER — Encounter: Payer: 59 | Admitting: Physical Therapy

## 2015-08-22 ENCOUNTER — Encounter: Payer: Self-pay | Admitting: Gastroenterology

## 2015-08-22 VITALS — BP 120/70 | HR 72 | Ht 64.0 in | Wt 170.4 lb

## 2015-08-22 DIAGNOSIS — Z1211 Encounter for screening for malignant neoplasm of colon: Secondary | ICD-10-CM

## 2015-08-22 DIAGNOSIS — K219 Gastro-esophageal reflux disease without esophagitis: Secondary | ICD-10-CM | POA: Diagnosis not present

## 2015-08-22 DIAGNOSIS — R112 Nausea with vomiting, unspecified: Secondary | ICD-10-CM | POA: Diagnosis not present

## 2015-08-22 MED ORDER — DEXLANSOPRAZOLE 60 MG PO CPDR: 60.0000 mg | DELAYED_RELEASE_CAPSULE | Freq: Every day | ORAL | Status: DC

## 2015-08-22 MED FILL — DEXILANT DR 60 MG CAPSULE: 60 | 90 days supply | Qty: 90 | Fill #0

## 2015-08-22 NOTE — Patient Instructions (Signed)
We have sent the following medications to your pharmacy for you to pick up at your convenience:Dexilant.   If you see no improvement in your symptoms then stop Dexilant and start Protonix (that you have at home) daily.   Patient advised to avoid spicy, acidic, citrus, chocolate, mints, fruit and fruit juices.  Limit the intake of caffeine, alcohol and Soda.  Don't exercise too soon after eating.  Don't lie down within 3-4 hours of eating.  Elevate the head of your bed.  You have been scheduled for an appointment with Dr. Excell Seltzer at [facility]. Your appointment is on 09/14/15 at 11:45am. Please arrive at 11:30am for registration. Make certain to bring a list of current medications, including any over the counter medications or vitamins. Also bring your co-pay if you have one as well as your insurance cards. [facility] is located at [address]. Should you need to reschedule your appointment, please contact them at [phone].  Normal BMI (Body Mass Index- based on height and weight) is between 19 and 25. Your BMI today is Body mass index is 29.23 kg/(m^2). Marland Kitchen Please consider follow up  regarding your BMI with your Primary Care Provider.   Thank you for choosing me and [facility].  Pricilla Riffle. Dagoberto Ligas., MD., Marval Regal

## 2015-08-22 NOTE — Progress Notes (Signed)
    History of Present Illness: This is a 55 year old female with GERD, regurg, N/V. Gastric sleeve in 03/2013 by Dr. Excell Seltzer. EGD in 06/2014 showed a 5 cm HH and prior gastric sleeve. Dexilant 30 mg daily controlled symptoms for several months however GERD, regurg, N/V has been active for 2 months. Now taking Protonix 40 mg po bid. No ASA/NSAIDs, no dysphagia, no abdominal pain.   Current Medications, Allergies, Past Medical History, Past Surgical History, Family History and Social History were reviewed in [employer] record.  Physical Exam: General: Well developed, well nourished, no acute distress Head: Normocephalic and atraumatic Eyes:  sclerae anicteric, EOMI Ears: Normal auditory acuity Mouth: No deformity or lesions Lungs: Clear throughout to auscultation Heart: Regular rate and rhythm; no murmurs, rubs or bruits Abdomen: Soft, non tender and non distended. No masses, hepatosplenomegaly or hernias noted. Normal Bowel sounds Musculoskeletal: Symmetrical with no gross deformities  Pulses:  Normal pulses noted Extremities: No clubbing, cyanosis, edema or deformities noted Neurological: Alert oriented x 4, grossly nonfocal Psychological:  Alert and cooperative. Normal mood and affect  Assessment and Recommendations:  1. GERD, HH, N/V, post gastric sleeve in 03/2013. Possible delayed emptying and/or refractory GERD. Start Dexilant 60 mg po qam. Add Protonix 40 mg qpm if symptoms not controlled. Antireflux measures. Follow dietary restriction post gastric sleeve. Follow up with Dr. Excell Seltzer. REV in 3 months with me.   2. CRC screening, average risk. She is overdue for screening colonoscopy. Delay colonoscopy until UGI symptoms are better controlled.

## 2015-08-25 ENCOUNTER — Encounter: Payer: 59 | Admitting: Physical Therapy

## 2015-08-30 DIAGNOSIS — Z6828 Body mass index (BMI) 28.0-28.9, adult: Secondary | ICD-10-CM | POA: Diagnosis not present

## 2015-08-30 DIAGNOSIS — M542 Cervicalgia: Secondary | ICD-10-CM | POA: Diagnosis not present

## 2015-08-30 DIAGNOSIS — M5412 Radiculopathy, cervical region: Secondary | ICD-10-CM | POA: Diagnosis not present

## 2015-08-30 DIAGNOSIS — M47812 Spondylosis without myelopathy or radiculopathy, cervical region: Secondary | ICD-10-CM | POA: Diagnosis not present

## 2015-08-30 DIAGNOSIS — M50223 Other cervical disc displacement at C6-C7 level: Secondary | ICD-10-CM | POA: Diagnosis not present

## 2015-08-31 ENCOUNTER — Other Ambulatory Visit (HOSPITAL_COMMUNITY): Payer: Self-pay | Admitting: Neurosurgery

## 2015-08-31 DIAGNOSIS — IMO0002 Reserved for concepts with insufficient information to code with codable children: Secondary | ICD-10-CM

## 2015-09-07 ENCOUNTER — Ambulatory Visit (HOSPITAL_COMMUNITY)
Admission: RE | Admit: 2015-09-07 | Discharge: 2015-09-07 | Disposition: A | Discharge status: Home / Self Care | Payer: 59 | Source: Ambulatory Visit | Attending: Neurosurgery | Admitting: Neurosurgery

## 2015-09-07 DIAGNOSIS — M50223 Other cervical disc displacement at C6-C7 level: Secondary | ICD-10-CM | POA: Insufficient documentation

## 2015-09-07 DIAGNOSIS — M50222 Other cervical disc displacement at C5-C6 level: Secondary | ICD-10-CM | POA: Diagnosis not present

## 2015-09-07 DIAGNOSIS — M519 Unspecified thoracic, thoracolumbar and lumbosacral intervertebral disc disorder: Secondary | ICD-10-CM | POA: Diagnosis not present

## 2015-09-07 DIAGNOSIS — M47892 Other spondylosis, cervical region: Secondary | ICD-10-CM | POA: Diagnosis not present

## 2015-09-07 DIAGNOSIS — IMO0002 Reserved for concepts with insufficient information to code with codable children: Secondary | ICD-10-CM

## 2015-09-14 DIAGNOSIS — Z9884 Bariatric surgery status: Secondary | ICD-10-CM | POA: Diagnosis not present

## 2015-09-15 ENCOUNTER — Other Ambulatory Visit: Payer: Self-pay | Admitting: General Surgery

## 2015-09-15 DIAGNOSIS — Z9884 Bariatric surgery status: Secondary | ICD-10-CM

## 2015-09-18 DIAGNOSIS — M47812 Spondylosis without myelopathy or radiculopathy, cervical region: Secondary | ICD-10-CM | POA: Diagnosis not present

## 2015-09-18 DIAGNOSIS — Z6828 Body mass index (BMI) 28.0-28.9, adult: Secondary | ICD-10-CM | POA: Diagnosis not present

## 2015-09-18 DIAGNOSIS — G5601 Carpal tunnel syndrome, right upper limb: Secondary | ICD-10-CM | POA: Diagnosis not present

## 2015-09-18 DIAGNOSIS — M542 Cervicalgia: Secondary | ICD-10-CM | POA: Diagnosis not present

## 2015-09-18 DIAGNOSIS — M50223 Other cervical disc displacement at C6-C7 level: Secondary | ICD-10-CM | POA: Diagnosis not present

## 2015-09-18 DIAGNOSIS — M5412 Radiculopathy, cervical region: Secondary | ICD-10-CM | POA: Diagnosis not present

## 2015-09-26 ENCOUNTER — Ambulatory Visit
Admission: RE | Admit: 2015-09-26 | Discharge: 2015-09-26 | Disposition: A | Discharge status: Home / Self Care | Payer: 59 | Source: Ambulatory Visit | Attending: General Surgery | Admitting: General Surgery

## 2015-09-26 DIAGNOSIS — Z9884 Bariatric surgery status: Secondary | ICD-10-CM

## 2015-09-26 DIAGNOSIS — K449 Diaphragmatic hernia without obstruction or gangrene: Secondary | ICD-10-CM | POA: Diagnosis not present

## 2015-09-26 DIAGNOSIS — K219 Gastro-esophageal reflux disease without esophagitis: Secondary | ICD-10-CM | POA: Diagnosis not present

## 2015-10-18 MED FILL — SYNTHROID 137 MCG TABLET: 137 | 90 days supply | Qty: 90 | Fill #0

## 2015-10-23 DIAGNOSIS — E890 Postprocedural hypothyroidism: Secondary | ICD-10-CM | POA: Diagnosis not present

## 2015-10-26 MED FILL — SYNTHROID 150 MCG TABLET: 150 | 30 days supply | Qty: 20 | Fill #0

## 2015-11-01 ENCOUNTER — Encounter: Payer: Self-pay | Admitting: Gastroenterology

## 2015-11-13 MED FILL — DEXILANT DR 60 MG CAPSULE: 60 | 90 days supply | Qty: 90 | Fill #1

## 2015-11-24 ENCOUNTER — Other Ambulatory Visit: Payer: Self-pay | Admitting: General Surgery

## 2015-11-24 DIAGNOSIS — Z9884 Bariatric surgery status: Secondary | ICD-10-CM | POA: Diagnosis not present

## 2015-11-24 MED FILL — NEOMYCIN 500 MG TABLET: 500 | 1 days supply | Qty: 6 | Fill #0

## 2015-11-24 MED FILL — ONDANSETRON ODT 4 MG TABLET: 4 | 4 days supply | Qty: 15 | Fill #0

## 2015-11-24 MED FILL — metroNIDAZOLE 500 MG TABS: 500 | 1 days supply | Qty: 6 | Fill #0

## 2015-11-27 MED FILL — oxyCODONE HCL 5 MG/5ML SOLN: 5 | 4 days supply | Qty: 200 | Fill #0

## 2015-12-04 NOTE — Patient Instructions (Signed)
Tasha Pearson  12/04/2015   Your procedure is scheduled on: 12-11-15  Report to [facility]  Entrance take [facility]  elevators to 3rd floor to  [place] at 515 AM.  Call this number if you have problems the morning of surgery [phone]   Remember: ONLY 1 PERSON MAY GO WITH YOU TO SHORT STAY TO GET  READY MORNING OF [place].  Do not eat food or drink liquids :After Midnight.     Take these medicines the morning of surgery with A SIP OF WATER: DEXILANT, LEVOTHYROXINE(SYNTHROID)              You may not have any metal on your body including hair pins and              piercings  Do not wear jewelry, make-up, lotions, powders or perfumes, deodorant             Do not wear nail polish.  Do not shave  48 hours prior to surgery.              Men may shave face and neck.   Do not bring valuables to the hospital. [place].  Contacts, dentures or bridgework may not be worn into surgery.  Leave suitcase in the car. After surgery it may be brought to your room.                  Please read over the following fact sheets you were given: _____________________________________________________________________             [facility] - Preparing for Surgery Before surgery, you can play an important role.  Because skin is not sterile, your skin needs to be as free of germs as possible.  You can reduce the number of germs on your skin by washing with CHG (chlorahexidine gluconate) soap before surgery.  CHG is an antiseptic cleaner which kills germs and bonds with the skin to continue killing germs even after washing. Please DO NOT use if you have an allergy to CHG or antibacterial soaps.  If your skin becomes reddened/irritated stop using the CHG and inform your nurse when you arrive at Short Stay. Do not shave (including legs and underarms) for at least 48 hours prior to the first CHG shower.  You may shave your  face/neck. Please follow these instructions carefully:  1.  Shower with CHG Soap the night before surgery and the  morning of Surgery.  2.  If you choose to wash your hair, wash your hair first as usual with your  normal  shampoo.  3.  After you shampoo, rinse your hair and body thoroughly to remove the  shampoo.                           4.  Use CHG as you would any other liquid soap.  You can apply chg directly  to the skin and wash                       Gently with a scrungie or clean washcloth.  5.  Apply the CHG Soap to your body ONLY FROM THE NECK DOWN.   Do not use on face/ open  Wound or open sores. Avoid contact with eyes, ears mouth and genitals (private parts).                       Wash face,  Genitals (private parts) with your normal soap.             6.  Wash thoroughly, paying special attention to the area where your surgery  will be performed.  7.  Thoroughly rinse your body with warm water from the neck down.  8.  DO NOT shower/wash with your normal soap after using and rinsing off  the CHG Soap.                9.  Pat yourself dry with a clean towel.            10.  Wear clean pajamas.            11.  Place clean sheets on your bed the night of your first shower and do not  sleep with pets. Day of Surgery : Do not apply any lotions/deodorants the morning of surgery.  Please wear clean clothes to the hospital/surgery center.  FAILURE TO FOLLOW THESE INSTRUCTIONS MAY RESULT IN THE CANCELLATION OF YOUR SURGERY PATIENT SIGNATURE_________________________________  NURSE SIGNATURE__________________________________  ________________________________________________________________________

## 2015-12-05 ENCOUNTER — Encounter (HOSPITAL_COMMUNITY): Payer: Self-pay

## 2015-12-05 ENCOUNTER — Encounter (HOSPITAL_COMMUNITY)
Admission: RE | Admit: 2015-12-05 | Discharge: 2015-12-05 | Disposition: A | Discharge status: Home / Self Care | Payer: 59 | Source: Ambulatory Visit | Attending: General Surgery | Admitting: General Surgery

## 2015-12-05 DIAGNOSIS — Z01812 Encounter for preprocedural laboratory examination: Secondary | ICD-10-CM | POA: Diagnosis not present

## 2015-12-05 LAB — COMPREHENSIVE METABOLIC PANEL
ALBUMIN: 4.2 g/dL (ref 3.5–5.0)
ALK PHOS: 65 U/L (ref 38–126)
ALT: 14 U/L (ref 14–54)
ANION GAP: 5 (ref 5–15)
AST: 19 U/L (ref 15–41)
BUN: 22 mg/dL — ABNORMAL HIGH (ref 6–20)
CALCIUM: 9.4 mg/dL (ref 8.9–10.3)
CHLORIDE: 105 mmol/L (ref 101–111)
CO2: 30 mmol/L (ref 22–32)
Creatinine, Ser: 0.51 mg/dL (ref 0.44–1.00)
GFR calc non Af Amer: >60 mL/min (ref >60)
GLUCOSE: 86 mg/dL (ref 65–99)
POTASSIUM: 4.4 mmol/L (ref 3.5–5.1)
SODIUM: 140 mmol/L (ref 135–145)
Total Bilirubin: 0.6 mg/dL (ref 0.3–1.2)
Total Protein: 7.1 g/dL (ref 6.5–8.1)

## 2015-12-05 LAB — CBC WITH DIFFERENTIAL/PLATELET
BASOS PCT: 1 %
Basophils Absolute: 0 10*3/uL (ref 0.0–0.1)
EOS ABS: 0.1 10*3/uL (ref 0.0–0.7)
EOS PCT: 2 %
HCT: 38.9 % (ref 36.0–46.0)
HEMOGLOBIN: 12.7 g/dL (ref 12.0–15.0)
Lymphocytes Relative: 30 %
Lymphs Abs: 1.6 10*3/uL (ref 0.7–4.0)
MCH: 28.4 pg (ref 26.0–34.0)
MCHC: 32.6 g/dL (ref 30.0–36.0)
MCV: 87 fL (ref 78.0–100.0)
MONOS PCT: 10 %
Monocytes Absolute: 0.5 10*3/uL (ref 0.1–1.0)
NEUTROS PCT: 57 %
Neutro Abs: 3.1 10*3/uL (ref 1.7–7.7)
PLATELETS: 243 10*3/uL (ref 150–400)
RBC: 4.47 MIL/uL (ref 3.87–5.11)
RDW: 13.3 % (ref 11.5–15.5)
WBC: 5.4 10*3/uL (ref 4.0–10.5)

## 2015-12-10 NOTE — H&P (Signed)
History of Present Illness Tasha Pearson T. Nathen Balaban MD; 11/24/2015 3:13 PM) The patient is a 55 year old female presenting status-post bariatric surgery. She returns with history of sleeve gastrectomy surgery date December 2014. At her one-year follow-up she was having some significant reflux although controlled with medications and rare vomiting. BMI had decreased to 24. Over the next 9 months she has had increasing problems with reflux and particularly with vomiting post meals. We have done a workup including upper endoscopy which was negative except for a small, proximally 5 cm hiatal hernia. Upper GI series also was negative but appeared to show a small hiatal hernia and was positive for reflux. No stricture or inflammation was seen. She returns today with steadily worsening symptoms. Her reflux is severe even on medications. She is now having frequent vomiting over the last several months with phlegm and then food regurgitation after meals. She has had some maladaptive eating and her weight is up slightly. With this constellation of findings we have previously discussed extensively and have elected to proceed with conversion to Roux-en-Y gastric bypass. We again discussed the procedure in detail including its nature and slightly increased risks of bleeding or staple line leak with revisional procedure, blood clots and long-term risks of malabsorption, stricture, ulcers or internal hernias. We reviewed the anatomy i again today and all her questions were answered.   Problem List/Past Medical Edward Jolly, MD; 11/24/2015 3:13 PM) Follow up 3 months Gastroesophageal Reflux Disease S/P LAPAROSCOPIC SLEEVE GASTRECTOMY (Z98.84) GERD (GASTROESOPHAGEAL REFLUX DISEASE) (K21.9)  Other Problems Edward Jolly, MD; 11/24/2015 3:13 PM) Back Pain Thyroid Disease Hypercholesterolemia  Past Surgical History Edward Jolly, MD; 11/24/2015 3:13 PM) Hysterectomy (due to cancer) -  Partial Spinal Surgery - Lower Back Resection of Stomach Gallbladder Surgery - Laparoscopic Cesarean Section - Multiple Colon Polyp Removal - Colonoscopy Sleeve Gastrectomy12/19/2014  Diagnostic Studies History Edward Jolly, MD; 11/24/2015 3:13 PM) Colonoscopy 5-10 years ago Pap Smear 1-5 years ago Mammogram within last year  Allergies Elbert Ewings, CMA; 11/24/2015 2:53 PM) Penicillin G Benzathine & Proc *PENICILLINS* Edema.  Medication History Edward Jolly, MD; 11/24/2015 3:13 PM) Dexilant (60MG  Capsule DR, 1 (one) Capsule DR Oral two times daily, Taken starting 01/16/2015) Active. Synthroid (150MCG Tablet, Oral) Active. Medications Reconciled OxyCODONE HCl (5MG /5ML Solution, 5-10 Milliliter Oral every four hours, as needed, Taken starting 11/24/2015) Active. Protonix (40MG  Tablet DR, 1 (one) Tablet Oral daily, Taken starting 11/24/2015) Active. Zofran ODT (4MG  Tablet Disint, 1 (one) Tablet Oral every six hours, as needed, Taken starting 11/24/2015) Active. Neomycin Sulfate (500MG  Tablet, 2 (two) Tablet Oral SEE NOTE, Taken starting 11/24/2015) Active. (TAKE TWO TABLETS AT 2 PM, 3 PM, AND 10 PM THE DAY PRIOR TO SURGERY) Flagyl (500MG  Tablet, 2 (two) Tablet Oral SEE NOTE, Taken starting 11/24/2015) Active. (Take at 2pm, 3pm, and 10pm the day prior to your colon operation)  Social History Edward Jolly, MD; 11/24/2015 3:13 PM) Alcohol use Occasional alcohol use. Tobacco use Former smoker. No drug use No caffeine use  Family History Edward Jolly, MD; 11/24/2015 3:13 PM) Malignant Neoplasm Of Pancreas Mother. Hypertension Father. Heart disease in female family member before age 23 Heart Disease Father.  Pregnancy / Birth History Edward Jolly, MD; 11/24/2015 3:13 PM) Age of menopause [age] Gravida 2 Age at menarche 31 years. Para 2 Maternal age 84-35 Irregular periods  Vitals Elbert Ewings CMA; 11/24/2015 2:54  PM) 11/24/2015 2:53 PM Weight: 172 lb Height: 65in Body Surface Area: 1.86 m Body  Mass Index: 28.62 kg/m  Temp.: 97.69F(Temporal)  Pulse: 100 (Regular)  BP: 130/68 (Sitting, Left Arm, Standard)       Physical Exam Tasha Pearson T. Alexzia Kasler MD; 11/24/2015 3:14 PM) The physical exam findings are as follows: Note:General: Alert, well-developed and well nourished Caucasian female, in no distress Skin: Warm and dry without rash or infection. HEENT: No palpable masses or thyromegaly. Sclera nonicteric. Pupils equal round and reactive. Lymph nodes: No cervical, supraclavicular, nodes palpable. Lungs: Breath sounds clear and equal. No wheezing or increased work of breathing. Cardiovascular: Regular rate and rhythm without murmer. No JVD or edema. Peripheral pulses intact. No carotid bruits. Abdomen: Nondistended. Soft and nontender. No masses palpable. No organomegaly. No palpable hernias. Extremities: No edema or joint swelling or deformity. No chronic venous stasis changes. Neurologic: Alert and fully oriented. Gait normal. No focal weakness. Psychiatric: Normal mood and affect. Thought content appropriate with normal judgement and insight    Assessment & Plan Tasha Pearson T. Mynor Witkop MD; 11/24/2015 3:19 PM) S/P LAPAROSCOPIC SLEEVE GASTRECTOMY (Z98.84) Impression: Status post sleeve gastrectomy with excellent weight loss and improvement and comorbidities but persistently worsening reflux and now postprandial vomiting. She does appear to have a very small hiatal hernia but no other anatomic abnormalities. Her preoperative upper GI series has shown again a small hiatal hernia and normal anatomy of her sleeve but quite a bit of spontaneous gastroesophageal reflux. As previously discussed with plan to proceed with conversion to Roux-en-Y gastric bypass with hiatal hernia repair. We will get preoperative lab work including nutritional studies. As above she is well informed about the procedure  and all her questions were answered and she is ready to proceed. Current Plans Laparoscopic conversion to sleeve gastrectomy to Roux-en-Y gastric bypass with hiatal hernia repair

## 2015-12-11 ENCOUNTER — Inpatient Hospital Stay (HOSPITAL_COMMUNITY)
Admission: RE | Admit: 2015-12-11 | Discharge: 2015-12-13 | DRG: 328 | Disposition: A | Discharge status: Home / Self Care | Payer: 59 | Source: Ambulatory Visit | Attending: General Surgery | Admitting: General Surgery

## 2015-12-11 ENCOUNTER — Encounter (HOSPITAL_COMMUNITY)
Admission: RE | Disposition: A | Discharge status: Home / Self Care | Payer: Self-pay | Source: Ambulatory Visit | Attending: General Surgery

## 2015-12-11 ENCOUNTER — Inpatient Hospital Stay (HOSPITAL_COMMUNITY): Payer: 59 | Admitting: Certified Registered Nurse Anesthetist

## 2015-12-11 ENCOUNTER — Encounter (HOSPITAL_COMMUNITY): Payer: Self-pay | Admitting: *Deleted

## 2015-12-11 DIAGNOSIS — Z800 Family history of malignant neoplasm of digestive organs: Secondary | ICD-10-CM

## 2015-12-11 DIAGNOSIS — K660 Peritoneal adhesions (postprocedural) (postinfection): Secondary | ICD-10-CM | POA: Diagnosis present

## 2015-12-11 DIAGNOSIS — R7309 Other abnormal glucose: Secondary | ICD-10-CM

## 2015-12-11 DIAGNOSIS — E079 Disorder of thyroid, unspecified: Secondary | ICD-10-CM | POA: Diagnosis present

## 2015-12-11 DIAGNOSIS — K449 Diaphragmatic hernia without obstruction or gangrene: Secondary | ICD-10-CM | POA: Diagnosis present

## 2015-12-11 DIAGNOSIS — R1110 Vomiting, unspecified: Secondary | ICD-10-CM | POA: Diagnosis present

## 2015-12-11 DIAGNOSIS — K219 Gastro-esophageal reflux disease without esophagitis: Principal | ICD-10-CM | POA: Diagnosis present

## 2015-12-11 DIAGNOSIS — E782 Mixed hyperlipidemia: Secondary | ICD-10-CM

## 2015-12-11 DIAGNOSIS — E7800 Pure hypercholesterolemia, unspecified: Secondary | ICD-10-CM | POA: Diagnosis present

## 2015-12-11 DIAGNOSIS — Z9884 Bariatric surgery status: Secondary | ICD-10-CM | POA: Diagnosis not present

## 2015-12-11 DIAGNOSIS — Z87891 Personal history of nicotine dependence: Secondary | ICD-10-CM

## 2015-12-11 DIAGNOSIS — Z8249 Family history of ischemic heart disease and other diseases of the circulatory system: Secondary | ICD-10-CM | POA: Diagnosis not present

## 2015-12-11 DIAGNOSIS — Z79899 Other long term (current) drug therapy: Secondary | ICD-10-CM | POA: Diagnosis not present

## 2015-12-11 DIAGNOSIS — Z90710 Acquired absence of both cervix and uterus: Secondary | ICD-10-CM

## 2015-12-11 DIAGNOSIS — Z6828 Body mass index (BMI) 28.0-28.9, adult: Secondary | ICD-10-CM | POA: Diagnosis not present

## 2015-12-11 DIAGNOSIS — E119 Type 2 diabetes mellitus without complications: Secondary | ICD-10-CM | POA: Diagnosis not present

## 2015-12-11 HISTORY — PX: GASTRIC ROUX-EN-Y: SHX5262

## 2015-12-11 LAB — CBC
HEMATOCRIT: 34.2 % — AB (ref 36.0–46.0)
Hemoglobin: 11.6 g/dL — ABNORMAL LOW (ref 12.0–15.0)
MCH: 29.1 pg (ref 26.0–34.0)
MCHC: 33.9 g/dL (ref 30.0–36.0)
MCV: 85.7 fL (ref 78.0–100.0)
PLATELETS: 178 10*3/uL (ref 150–400)
RBC: 3.99 MIL/uL (ref 3.87–5.11)
RDW: 13 % (ref 11.5–15.5)
WBC: 9.5 10*3/uL (ref 4.0–10.5)

## 2015-12-11 LAB — CREATININE, SERUM
Creatinine, Ser: 0.76 mg/dL (ref 0.44–1.00)
GFR calc Af Amer: >60 mL/min (ref >60)
GFR calc non Af Amer: >60 mL/min (ref >60)

## 2015-12-11 LAB — HEMOGLOBIN AND HEMATOCRIT, BLOOD
HCT: 33.2 % — ABNORMAL LOW (ref 36.0–46.0)
Hemoglobin: 11.4 g/dL — ABNORMAL LOW (ref 12.0–15.0)

## 2015-12-11 SURGERY — LAPAROSCOPIC ROUX-EN-Y GASTRIC BYPASS WITH UPPER ENDOSCOPY
Anesthesia: General

## 2015-12-11 MED ORDER — FENTANYL CITRATE (PF) 250 MCG/5ML IJ SOLN
INTRAMUSCULAR | Status: AC
  Filled 2015-12-11: qty 5

## 2015-12-11 MED ORDER — FENTANYL CITRATE (PF) 100 MCG/2ML IJ SOLN
INTRAMUSCULAR | Status: AC
  Filled 2015-12-11: qty 2

## 2015-12-11 MED ORDER — FENTANYL CITRATE (PF) 100 MCG/2ML IJ SOLN
25.0000 ug | INTRAMUSCULAR | Status: DC | PRN
  Administered 2015-12-11: 50 ug via INTRAVENOUS

## 2015-12-11 MED ORDER — FAMOTIDINE IN NACL 20-0.9 MG/50ML-% IV SOLN
20.0000 mg | Freq: Once | INTRAVENOUS | Status: AC
  Administered 2015-12-11: 20 mg via INTRAVENOUS
  Filled 2015-12-11: qty 50

## 2015-12-11 MED ORDER — ONDANSETRON HCL 4 MG/2ML IJ SOLN
INTRAMUSCULAR | Status: AC
  Filled 2015-12-11: qty 2

## 2015-12-11 MED ORDER — STERILE WATER FOR IRRIGATION IR SOLN
Status: DC | PRN
  Administered 2015-12-11: 2000 mL

## 2015-12-11 MED ORDER — MIDAZOLAM HCL 2 MG/2ML IJ SOLN
INTRAMUSCULAR | Status: AC
  Filled 2015-12-11: qty 2

## 2015-12-11 MED ORDER — LEVOFLOXACIN IN D5W 750 MG/150ML IV SOLN
INTRAVENOUS | Status: AC
  Filled 2015-12-11: qty 150

## 2015-12-11 MED ORDER — LIDOCAINE HCL (CARDIAC) 20 MG/ML IV SOLN
INTRAVENOUS | Status: AC
  Filled 2015-12-11: qty 5

## 2015-12-11 MED ORDER — METOCLOPRAMIDE HCL 5 MG/ML IJ SOLN
INTRAMUSCULAR | Status: AC
  Filled 2015-12-11: qty 2

## 2015-12-11 MED ORDER — LIDOCAINE HCL (CARDIAC) 20 MG/ML IV SOLN
INTRAVENOUS | Status: DC | PRN
  Administered 2015-12-11: 50 mg via INTRAVENOUS

## 2015-12-11 MED ORDER — GLYCOPYRROLATE 0.2 MG/ML IJ SOLN
INTRAMUSCULAR | Status: DC | PRN
  Administered 2015-12-11: 0.6 mg via INTRAVENOUS

## 2015-12-11 MED ORDER — BUPIVACAINE-EPINEPHRINE 0.25% -1:200000 IJ SOLN
INTRAMUSCULAR | Status: DC | PRN
  Administered 2015-12-11: 20 mL

## 2015-12-11 MED ORDER — PROPOFOL 10 MG/ML IV BOLUS
INTRAVENOUS | Status: AC
  Filled 2015-12-11: qty 40

## 2015-12-11 MED ORDER — METRONIDAZOLE IN NACL 5-0.79 MG/ML-% IV SOLN: 500.0000 mg | INTRAVENOUS | Status: DC

## 2015-12-11 MED ORDER — ACETAMINOPHEN 160 MG/5ML PO SOLN: 325.0000 mg | ORAL | Status: DC | PRN

## 2015-12-11 MED ORDER — ONDANSETRON HCL 4 MG/2ML IJ SOLN
4.0000 mg | INTRAMUSCULAR | Status: DC | PRN
  Administered 2015-12-11 (×2): 4 mg via INTRAVENOUS
  Filled 2015-12-11 (×2): qty 2

## 2015-12-11 MED ORDER — OXYCODONE HCL 5 MG/5ML PO SOLN
5.0000 mg | ORAL | Status: DC | PRN
  Administered 2015-12-12: 10 mg via ORAL
  Administered 2015-12-12 (×2): 5 mg via ORAL
  Administered 2015-12-13 (×2): 10 mg via ORAL
  Filled 2015-12-11 (×2): qty 10
  Filled 2015-12-11: qty 25
  Filled 2015-12-11: qty 10
  Filled 2015-12-11: qty 25
  Filled 2015-12-11: qty 10

## 2015-12-11 MED ORDER — LEVOFLOXACIN IN D5W 750 MG/150ML IV SOLN
750.0000 mg | INTRAVENOUS | Status: AC
  Administered 2015-12-11: 750 mg via INTRAVENOUS

## 2015-12-11 MED ORDER — LEVOTHYROXINE SODIUM 100 MCG IV SOLR
75.0000 ug | Freq: Every day | INTRAVENOUS | Status: DC
  Administered 2015-12-12 – 2015-12-13 (×2): 75 ug via INTRAVENOUS
  Filled 2015-12-11 (×2): qty 5

## 2015-12-11 MED ORDER — BUPIVACAINE-EPINEPHRINE 0.25% -1:200000 IJ SOLN
INTRAMUSCULAR | Status: AC
  Filled 2015-12-11: qty 1

## 2015-12-11 MED ORDER — METOCLOPRAMIDE HCL 5 MG/ML IJ SOLN
10.0000 mg | Freq: Once | INTRAMUSCULAR | Status: AC | PRN
  Administered 2015-12-11: 10 mg via INTRAVENOUS

## 2015-12-11 MED ORDER — KCL IN DEXTROSE-NACL 20-5-0.9 MEQ/L-%-% IV SOLN
INTRAVENOUS | Status: DC
  Administered 2015-12-11: 125 mL/h via INTRAVENOUS
  Administered 2015-12-12 (×2): via INTRAVENOUS
  Administered 2015-12-13: 1000 mL via INTRAVENOUS
  Filled 2015-12-11 (×7): qty 1000

## 2015-12-11 MED ORDER — FENTANYL CITRATE (PF) 100 MCG/2ML IJ SOLN
INTRAMUSCULAR | Status: DC | PRN
  Administered 2015-12-11: 100 ug via INTRAVENOUS
  Administered 2015-12-11: 50 ug via INTRAVENOUS
  Administered 2015-12-11: 100 ug via INTRAVENOUS
  Administered 2015-12-11 (×2): 50 ug via INTRAVENOUS

## 2015-12-11 MED ORDER — BUPIVACAINE LIPOSOME 1.3 % IJ SUSP
20.0000 mL | Freq: Once | INTRAMUSCULAR | Status: DC
  Filled 2015-12-11: qty 20

## 2015-12-11 MED ORDER — PHENYLEPHRINE HCL 10 MG/ML IJ SOLN
INTRAMUSCULAR | Status: DC | PRN
  Administered 2015-12-11: 80 ug via INTRAVENOUS
  Administered 2015-12-11: 40 ug via INTRAVENOUS
  Administered 2015-12-11 (×4): 80 ug via INTRAVENOUS
  Administered 2015-12-11: 40 ug via INTRAVENOUS
  Administered 2015-12-11 (×2): 120 ug via INTRAVENOUS
  Administered 2015-12-11: 80 ug via INTRAVENOUS
  Administered 2015-12-11 (×2): 40 ug via INTRAVENOUS

## 2015-12-11 MED ORDER — ACETAMINOPHEN 160 MG/5ML PO SOLN
650.0000 mg | ORAL | Status: DC | PRN
  Administered 2015-12-12 – 2015-12-13 (×3): 650 mg via ORAL
  Filled 2015-12-11 (×3): qty 20.3

## 2015-12-11 MED ORDER — METRONIDAZOLE IN NACL 5-0.79 MG/ML-% IV SOLN
INTRAVENOUS | Status: AC
  Filled 2015-12-11: qty 100

## 2015-12-11 MED ORDER — CIPROFLOXACIN IN D5W 400 MG/200ML IV SOLN
INTRAVENOUS | Status: AC
  Filled 2015-12-11: qty 200

## 2015-12-11 MED ORDER — ROCURONIUM BROMIDE 100 MG/10ML IV SOLN
INTRAVENOUS | Status: DC | PRN
  Administered 2015-12-11: 10 mg via INTRAVENOUS
  Administered 2015-12-11: 5 mg via INTRAVENOUS
  Administered 2015-12-11: 50 mg via INTRAVENOUS
  Administered 2015-12-11: 20 mg via INTRAVENOUS

## 2015-12-11 MED ORDER — MIDAZOLAM HCL 5 MG/5ML IJ SOLN
INTRAMUSCULAR | Status: DC | PRN
  Administered 2015-12-11: 2 mg via INTRAVENOUS

## 2015-12-11 MED ORDER — PROPOFOL 10 MG/ML IV BOLUS
INTRAVENOUS | Status: DC | PRN
  Administered 2015-12-11: 150 mg via INTRAVENOUS

## 2015-12-11 MED ORDER — ENOXAPARIN SODIUM 40 MG/0.4ML ~~LOC~~ SOLN
40.0000 mg | SUBCUTANEOUS | Status: DC
  Administered 2015-12-12 – 2015-12-13 (×2): 40 mg via SUBCUTANEOUS
  Filled 2015-12-11 (×2): qty 0.4

## 2015-12-11 MED ORDER — BUPIVACAINE LIPOSOME 1.3 % IJ SUSP
INTRAMUSCULAR | Status: DC | PRN
  Administered 2015-12-11: 20 mL

## 2015-12-11 MED ORDER — PREMIER PROTEIN SHAKE
2.0000 [oz_av] | ORAL | Status: DC
  Administered 2015-12-13 (×4): 2 [oz_av] via ORAL

## 2015-12-11 MED ORDER — HEPARIN SODIUM (PORCINE) 5000 UNIT/ML IJ SOLN
5000.0000 [IU] | INTRAMUSCULAR | Status: AC
  Administered 2015-12-11: 5000 [IU] via SUBCUTANEOUS
  Filled 2015-12-11: qty 1

## 2015-12-11 MED ORDER — PROMETHAZINE HCL 25 MG/ML IJ SOLN
12.5000 mg | INTRAMUSCULAR | Status: DC | PRN
  Administered 2015-12-11: 12.5 mg via INTRAVENOUS
  Filled 2015-12-11: qty 1

## 2015-12-11 MED ORDER — CHLORHEXIDINE GLUCONATE CLOTH 2 % EX PADS: 6.0000 | MEDICATED_PAD | Freq: Once | CUTANEOUS | Status: DC

## 2015-12-11 MED ORDER — MORPHINE SULFATE (PF) 2 MG/ML IV SOLN
2.0000 mg | INTRAVENOUS | Status: DC | PRN
  Administered 2015-12-11: 2 mg via INTRAVENOUS
  Administered 2015-12-11: 4 mg via INTRAVENOUS
  Administered 2015-12-11: 2 mg via INTRAVENOUS
  Administered 2015-12-12: 4 mg via INTRAVENOUS
  Administered 2015-12-12: 2 mg via INTRAVENOUS
  Administered 2015-12-12: 4 mg via INTRAVENOUS
  Filled 2015-12-11: qty 1
  Filled 2015-12-11 (×2): qty 2
  Filled 2015-12-11 (×2): qty 1
  Filled 2015-12-11 (×2): qty 2
  Filled 2015-12-11: qty 1

## 2015-12-11 MED ORDER — CIPROFLOXACIN IN D5W 400 MG/200ML IV SOLN: 400.0000 mg | INTRAVENOUS | Status: DC

## 2015-12-11 MED ORDER — SODIUM CHLORIDE 0.9 % IJ SOLN
INTRAMUSCULAR | Status: DC | PRN
  Administered 2015-12-11: 50 mL via INTRAVENOUS

## 2015-12-11 MED ORDER — FAMOTIDINE IN NACL 20-0.9 MG/50ML-% IV SOLN
20.0000 mg | Freq: Two times a day (BID) | INTRAVENOUS | Status: DC
  Administered 2015-12-11 – 2015-12-13 (×4): 20 mg via INTRAVENOUS
  Filled 2015-12-11 (×6): qty 50

## 2015-12-11 MED ORDER — SODIUM CHLORIDE 0.9 % IJ SOLN
INTRAMUSCULAR | Status: AC
  Filled 2015-12-11: qty 50

## 2015-12-11 MED ORDER — SUCCINYLCHOLINE CHLORIDE 20 MG/ML IJ SOLN
INTRAMUSCULAR | Status: DC | PRN
  Administered 2015-12-11: 100 mg via INTRAVENOUS

## 2015-12-11 MED ORDER — MORPHINE SULFATE (PF) 10 MG/ML IV SOLN: 2.0000 mg | INTRAVENOUS | Status: DC | PRN

## 2015-12-11 MED ORDER — PHENYLEPHRINE 40 MCG/ML (10ML) SYRINGE FOR IV PUSH (FOR BLOOD PRESSURE SUPPORT)
PREFILLED_SYRINGE | INTRAVENOUS | Status: AC
  Filled 2015-12-11: qty 20

## 2015-12-11 MED ORDER — LACTATED RINGERS IR SOLN
Status: DC | PRN
  Administered 2015-12-11: 1000 mL

## 2015-12-11 MED ORDER — ONDANSETRON HCL 4 MG/2ML IJ SOLN
INTRAMUSCULAR | Status: DC | PRN
  Administered 2015-12-11: 4 mg via INTRAVENOUS

## 2015-12-11 MED ORDER — EPHEDRINE SULFATE 50 MG/ML IJ SOLN
INTRAMUSCULAR | Status: DC | PRN
  Administered 2015-12-11 (×4): 5 mg via INTRAVENOUS

## 2015-12-11 MED ORDER — EVICEL 5 ML EX KIT
PACK | Freq: Once | CUTANEOUS | Status: AC
  Administered 2015-12-11: 5 mL
  Filled 2015-12-11: qty 1

## 2015-12-11 MED ORDER — NEOSTIGMINE METHYLSULFATE 10 MG/10ML IV SOLN
INTRAVENOUS | Status: DC | PRN
  Administered 2015-12-11: 3 mg via INTRAVENOUS

## 2015-12-11 MED ORDER — LACTATED RINGERS IV SOLN
INTRAVENOUS | Status: DC | PRN
  Administered 2015-12-11 (×4): via INTRAVENOUS

## 2015-12-11 SURGICAL SUPPLY — 86 items
APPLICATOR COTTON TIP 6IN STRL (MISCELLANEOUS) IMPLANT
APPLIER CLIP ROT 10 11.4 M/L (STAPLE)
APPLIER CLIP ROT 13.4 12 LRG (CLIP)
BLADE SURG SZ11 CARB STEEL (BLADE) ×3 IMPLANT
CABLE HIGH FREQUENCY MONO STRZ (ELECTRODE) ×3 IMPLANT
CHLORAPREP W/TINT 26ML (MISCELLANEOUS) ×3 IMPLANT
CLIP APPLIE ROT 10 11.4 M/L (STAPLE) IMPLANT
CLIP APPLIE ROT 13.4 12 LRG (CLIP) IMPLANT
CLIP SUT LAPRA TY ABSORB (SUTURE) ×6 IMPLANT
COVER SURGICAL LIGHT HANDLE (MISCELLANEOUS) ×3 IMPLANT
CUTTER FLEX LINEAR 45M (STAPLE) ×3 IMPLANT
DECANTER SPIKE VIAL GLASS SM (MISCELLANEOUS) ×3 IMPLANT
DEVICE SUT QUICK LOAD TK 5 (STAPLE) ×6 IMPLANT
DEVICE SUT TI-KNOT TK 5X26 (MISCELLANEOUS) ×2 IMPLANT
DEVICE SUTURE ENDOST 10MM (ENDOMECHANICALS) ×3 IMPLANT
DEVICE TI KNOT TK5 (MISCELLANEOUS) ×1
DISSECTOR BLUNT TIP ENDO 5MM (MISCELLANEOUS) ×3 IMPLANT
DRAIN PENROSE 18X1/2 LTX STRL (DRAIN) ×3 IMPLANT
DRAIN PENROSE 18X1/4 LTX STRL (WOUND CARE) ×3 IMPLANT
ELECT REM PT RETURN 9FT ADLT (ELECTROSURGICAL) ×3
ELECTRODE REM PT RTRN 9FT ADLT (ELECTROSURGICAL) ×1 IMPLANT
GAUZE SPONGE 4X4 12PLY STRL (GAUZE/BANDAGES/DRESSINGS) IMPLANT
GAUZE SPONGE 4X4 16PLY XRAY LF (GAUZE/BANDAGES/DRESSINGS) ×3 IMPLANT
GLOVE BIOGEL PI IND STRL 7.5 (GLOVE) ×1 IMPLANT
GLOVE BIOGEL PI INDICATOR 7.5 (GLOVE) ×2
GLOVE ECLIPSE 7.5 STRL STRAW (GLOVE) ×3 IMPLANT
GOWN STRL REUS W/TWL XL LVL3 (GOWN DISPOSABLE) ×15 IMPLANT
HEMOSTAT SURGICEL 4X8 (HEMOSTASIS) IMPLANT
HOVERMATT SINGLE USE (MISCELLANEOUS) ×3 IMPLANT
IRRIG SUCT STRYKERFLOW 2 WTIP (MISCELLANEOUS) ×3
IRRIGATION SUCT STRKRFLW 2 WTP (MISCELLANEOUS) ×1 IMPLANT
KIT BASIN OR (CUSTOM PROCEDURE TRAY) ×3 IMPLANT
KIT GASTRIC LAVAGE 34FR ADT (SET/KITS/TRAYS/PACK) ×3 IMPLANT
LIQUID BAND (GAUZE/BANDAGES/DRESSINGS) ×3 IMPLANT
LUBRICANT JELLY K Y 4OZ (MISCELLANEOUS) ×3 IMPLANT
MARKER SKIN DUAL TIP RULER LAB (MISCELLANEOUS) ×3 IMPLANT
NEEDLE SPNL 22GX3.5 QUINCKE BK (NEEDLE) ×3 IMPLANT
PACK CARDIOVASCULAR III (CUSTOM PROCEDURE TRAY) ×3 IMPLANT
POUCH SPECIMEN RETRIEVAL 10MM (ENDOMECHANICALS) IMPLANT
QUICK LOAD TK 5 (STAPLE) ×3
RELOAD 45 VASCULAR/THIN (ENDOMECHANICALS) ×3 IMPLANT
RELOAD ENDO STITCH 2.0 (ENDOMECHANICALS)
RELOAD STAPLE TA45 3.5 REG BLU (ENDOMECHANICALS) ×6 IMPLANT
RELOAD STAPLER BLUE 60MM (STAPLE) ×1 IMPLANT
RELOAD STAPLER GOLD 60MM (STAPLE) ×1 IMPLANT
RELOAD STAPLER GREEN 60MM (STAPLE) ×1 IMPLANT
RELOAD STAPLER WHITE 60MM (STAPLE) ×1 IMPLANT
SCISSORS LAP 5X45 EPIX DISP (ENDOMECHANICALS) ×3 IMPLANT
SEALANT SURGICAL APPL DUAL CAN (MISCELLANEOUS) IMPLANT
SHEARS HARMONIC ACE PLUS 45CM (MISCELLANEOUS) ×3 IMPLANT
SLEEVE ADV FIXATION 12X100MM (TROCAR) ×6 IMPLANT
SOLUTION ANTI FOG 6CC (MISCELLANEOUS) ×3 IMPLANT
STAPLER ECHELON LONG 60 440 (INSTRUMENTS) ×3 IMPLANT
STAPLER RELOAD BLUE 60MM (STAPLE) ×3
STAPLER RELOAD GOLD 60MM (STAPLE) ×3
STAPLER RELOAD GREEN 60MM (STAPLE) ×3
STAPLER RELOAD WHITE 60MM (STAPLE) ×3
SUT DEVICE BRAIDED 0X39 (SUTURE) ×6 IMPLANT
SUT DVC SILK 2.0X39 (SUTURE) ×15 IMPLANT
SUT DVC VICRYL PGA 2.0X39 (SUTURE) ×15 IMPLANT
SUT MNCRL AB 4-0 PS2 18 (SUTURE) ×3 IMPLANT
SUT RELOAD ENDO STITCH 2 48X1 (ENDOMECHANICALS)
SUT RELOAD ENDO STITCH 2.0 (ENDOMECHANICALS)
SUT SURGIDAC NAB ES-9 0 48 120 (SUTURE) ×9 IMPLANT
SUT VIC AB 2-0 SH 27 (SUTURE) ×2
SUT VIC AB 2-0 SH 27X BRD (SUTURE) ×1 IMPLANT
SUT VICRYL 0 UR6 27IN ABS (SUTURE) ×3 IMPLANT
SUTURE RELOAD END STTCH 2 48X1 (ENDOMECHANICALS) IMPLANT
SUTURE RELOAD ENDO STITCH 2.0 (ENDOMECHANICALS) IMPLANT
SYR 10ML ECCENTRIC (SYRINGE) ×3 IMPLANT
SYR 20CC LL (SYRINGE) ×6 IMPLANT
SYR 50ML LL SCALE MARK (SYRINGE) ×3 IMPLANT
TIP RIGID 35CM EVICEL (HEMOSTASIS) ×3 IMPLANT
TOWEL OR 17X26 10 PK STRL BLUE (TOWEL DISPOSABLE) ×3 IMPLANT
TOWEL OR NON WOVEN STRL DISP B (DISPOSABLE) ×3 IMPLANT
TRAY FOLEY W/METER SILVER 14FR (SET/KITS/TRAYS/PACK) ×3 IMPLANT
TRAY FOLEY W/METER SILVER 16FR (SET/KITS/TRAYS/PACK) IMPLANT
TROCAR ADV FIXATION 12X100MM (TROCAR) ×3 IMPLANT
TROCAR ADV FIXATION 5X100MM (TROCAR) ×3 IMPLANT
TROCAR BLADELESS OPT 5 100 (ENDOMECHANICALS) ×3 IMPLANT
TROCAR XCEL 12X100 BLDLESS (ENDOMECHANICALS) ×3 IMPLANT
TROCAR XCEL BLUNT TIP 100MML (ENDOMECHANICALS) ×3 IMPLANT
TUBING CONNECTING 10 (TUBING) ×4 IMPLANT
TUBING CONNECTING 10' (TUBING) ×2
TUBING ENDO SMARTCAP PENTAX (MISCELLANEOUS) ×3 IMPLANT
TUBING INSUF HEATED (TUBING) ×3 IMPLANT

## 2015-12-11 NOTE — Interval H&P Note (Signed)
History and Physical Interval Note:  12/11/2015 7:12 AM  Tasha Pearson  has presented today for surgery, with the diagnosis of [name]  The various methods of treatment have been discussed with the patient and family. After consideration of risks, benefits and other options for treatment, the patient has consented to  Procedure(s): LAPAROSCOPIC ROUX-EN-Y GASTRIC BYPASS WITH UPPER ENDOSCOPY (N/A) as a surgical intervention .  The patient's history has been reviewed, patient examined, no change in status, stable for surgery.  I have reviewed the patient's chart and labs.  Questions were answered to the patient's satisfaction.     Ciani Rutten T

## 2015-12-11 NOTE — Anesthesia Preprocedure Evaluation (Addendum)
Anesthesia Evaluation  Patient identified by MRN, date of birth, ID band Patient awake    Reviewed: Allergy & Precautions, NPO status , Patient's Chart, lab work & pertinent test results  Airway Mallampati: II  TM Distance: >3 FB Neck ROM: Full    Dental  (+) Teeth Intact, Dental Advisory Given   Pulmonary former smoker,    breath sounds clear to auscultation       Cardiovascular  Rhythm:Regular Rate:Normal     Neuro/Psych    GI/Hepatic   Endo/Other    Renal/GU      Musculoskeletal   Abdominal   Peds  Hematology   Anesthesia Other Findings   Reproductive/Obstetrics                            Anesthesia Physical Anesthesia Plan  ASA: III  Anesthesia Plan: General   Post-op Pain Management:    Induction: Intravenous  Airway Management Planned: Oral ETT  Additional Equipment:   Intra-op Plan:   Post-operative Plan: Extubation in OR  Informed Consent: I have reviewed the patients History and Physical, chart, labs and discussed the procedure including the risks, benefits and alternatives for the proposed anesthesia with the patient or authorized representative who has indicated his/her understanding and acceptance.   Dental advisory given  Plan Discussed with: CRNA and Anesthesiologist  Anesthesia Plan Comments:        Anesthesia Quick Evaluation  

## 2015-12-11 NOTE — Anesthesia Procedure Notes (Signed)
Procedure Name: Intubation Performed by: Lizmary Nader J Pre-anesthesia Checklist: Patient identified, Emergency Drugs available, Suction available, Patient being monitored and Timeout performed Patient Re-evaluated:Patient Re-evaluated prior to inductionOxygen Delivery Method: Circle system utilized Preoxygenation: Pre-oxygenation with 100% oxygen Intubation Type: IV induction Ventilation: Mask ventilation without difficulty Laryngoscope Size: Mac and 3 Grade View: Grade I Tube type: Oral Tube size: 7.0 mm Number of attempts: 1 Airway Equipment and Method: Stylet Placement Confirmation: ETT inserted through vocal cords under direct vision,  positive ETCO2,  CO2 detector and breath sounds checked- equal and bilateral Secured at: 21 cm Tube secured with: Tape Dental Injury: Teeth and Oropharynx as per pre-operative assessment        

## 2015-12-11 NOTE — Transfer of Care (Signed)
Immediate Anesthesia Transfer of Care Note  Patient: Tasha Pearson  Procedure(s) Performed: Procedure(s): LAPAROSCOPIC ROUX-EN-Y GASTRIC BYPASS HIATEL HERNIA REPAIR WITH UPPER ENDOSCOPY (N/A)  Patient Location: PACU  Anesthesia Type:General  Level of Consciousness: awake, alert  and oriented  Airway & Oxygen Therapy: Patient Spontanous Breathing and Patient connected to face mask oxygen  Post-op Assessment: Report given to RN and Post -op Vital signs reviewed and stable  Post vital signs: Reviewed and stable  Last Vitals:  Vitals:   12/11/15 0542  BP: (!) 138/97  Pulse: 84  Resp: 18  Temp: 36.5 C    Last Pain:  Vitals:   12/11/15 0542  TempSrc: Oral      Patients Stated Pain Goal: 3 ([id number])  Complications: No apparent anesthesia complications

## 2015-12-11 NOTE — Anesthesia Postprocedure Evaluation (Signed)
Anesthesia Post Note  Patient: Tasha Pearson  Procedure(s) Performed: Procedure(s) (LRB): LAPAROSCOPIC ROUX-EN-Y GASTRIC BYPASS HIATEL HERNIA REPAIR WITH UPPER ENDOSCOPY (N/A)  Patient location during evaluation: PACU Anesthesia Type: General Level of consciousness: awake and awake and alert Pain management: pain level controlled Vital Signs Assessment: post-procedure vital signs reviewed and stable Respiratory status: spontaneous breathing and nonlabored ventilation Cardiovascular status: blood pressure returned to baseline Anesthetic complications: no    Last Vitals:  Vitals:   12/11/15 1430 12/11/15 1530  BP: 133/80 136/76  Pulse: 87 80  Resp: 16 16  Temp: 36.4 C 36.5 C    Last Pain:  Vitals:   12/11/15 1530  TempSrc: Oral  PainSc:                  Kresta Templeman COKER

## 2015-12-11 NOTE — Op Note (Signed)
Preoperative Diagnosis: Severe reflux and recurrent hiatal hernia post sleeve gastrectomy Same Postoprative Diagnosis: Tasha Pearson  Procedure: Procedure(s): LAPAROSCOPIC ROUX-EN-Y GASTRIC BYPASS HIATEL HERNIA REPAIR WITH UPPER ENDOSCOPY, Conversion from sleeve gastrectomy   Surgeon: Excell Seltzer T   Assistants: Alphonsa Overall  Anesthesia:  General endotracheal anesthesia  Indications: Patient is a 55 year old female status post laparoscopic sleeve gastrectomy with repair of a moderately large hiatal hernia in December 2014. The patient initially did well with some mild reflux and excellent weight loss but has over the last year or 2 developed steadily worsening severe reflux and occasional vomiting that has been unresponsive to medical management. Upper GI series and endoscopy has shown an apparent small recurrent hiatal hernia and free reflux with no other anatomical problem from her sleeve. We have extensively discussed options and risks detailed elsewhere and elected to proceed with laparoscopic repair of her hiatal hernia and conversion to Roux-en-Y gastric bypass.    Procedure Detail:  Patient was brought to the operating room, placed in the supine position on the operating table, and general endotracheal anesthesia induced. She received broad-spectrum preoperative IV antibiotics. PAS were in place. The abdomen was widely sterilely prepped and draped. Patient timeout was performed and correct procedure verified. Access was obtained through a previous incision at the umbilicus with an open Hassan technique through mattress suture of 0 Vicryl and pneumoperitoneum established. Under direct vision 12 mm trochars were placed laterally in the right upper quadrant, in the right upper quadrant mid abdomen and 5 mm trochars were placed laterally in the left upper quadrant and left mid abdomen. Through a 5 mm subxiphoid site the [facility] retractor was placed in the left lobe of the liver elevated with no  significant adhesions to the liver and very good exposure of the hiatus and stomach. Examination revealed an obvious moderate-sized recurrent hiatal hernia. We proceeded with dissection of the hiatus. Some adhesions around the gastrohepatic omentum were taken down and the right crus and hiatus exposed. Peritoneum along the right crus was incised and we could see posteriorly that was at least several centimeters of stomach in the mediastinum. We continued the dissection anteriorly over the crura and down the left crus. With careful sharp dissection and harmonic dissection adhesions of the herniated stomach were carefully divided and the stomach gradually reduced down into the abdominal cavity. A Penrose drain was placed around the EG junction after dissecting posteriorly. We then completed the posterior dissection completely freeing the crura circumferentially. The herniated stomach which was about 4-5 cm was completely reduced down into the abdominal cavity. The esophagus was dissected extensively up into the mediastinum to obtain length. After a thorough dissection the EG junction could be brought down into the abdomen and rested with no tension with at least 2 cm of esophagus below the diaphragm. A posterior crural repair was then performed with 3-0 Ethibond sutures. This was sized using the sizing tube and balloon which was inflated to 10 mL and pulled back snugly against the crural repair. We then proceeded with the gastric bypass. Measuring 5 cm from the EG junction the lesser curve of the sleeve was dissected and careful blunt dissection carried posteriorly along the stomach working over toward the staple line of the sleeve. This dissection was completed completely posteriorly and then the gastric pouch was created with an additional firing of the green load 60 mm echelon stapler which just about got all the way across the sleeve but was completed with a number firing of the 45 mm  blue load stapler. The staple  line was intact and without bleeding. The Roux limb was then created. We measured 40 cm from the ligament of Treitz and the small bowel was divided at this point with a single firing of the 60 mm white load stapler. The mesentery was divided for a short distance with the Harmonic scalpel. A 100 cm Roux limb was then measured. At this point a jejunojejunostomy was created near the end of the divided biliopancreatic limb to the side of the Roux limb with a single firing of the 45 mm white load stapler. The common enterotomy was closed with running 2-0 Vicryl beginning at either end of the staple line and tied centrally. The mesenteric defect was closed with running 2-0 silk. Suture staple lines of the jejunojejunostomy were coated with Evacel. Following this the Roux limb was brought up very easily in antecolic fashion with the candycane facing to the left and the gastrojejunostomy was constructed with an initial posterior row of running 2-0 Prolene between the Roux limb and the staple line of the pouch. Enterotomies were made in the gastric pouch and the Roux limb and an approximately 2-2-1/2 cm anastomosis was created with a single firing of the linear 45 mm blue load stapler. The staple line was intact and without bleeding. The common enterotomy was then closed from either end tied centrally with 0 Vicryl. The Ewald tube was then passed easily through the anastomosis and an anterior row of seromuscular 2-0 Vicryl was placed. The old tube was removed.Peterson defect was closed with 0 silk between the doesn't carry the transverse colon and the mesentery of the Roux limb. Dr. Lucia Gaskins then performed upper endoscopy and the outlet of the pouch clamped under tense distention and saline irrigation there was no evidence of leak. The suture staple onto the gastrojejunostomy were coated with Evicel. There was no evidence of bleeding or trocar injury or other problems. A TAP block was performed bilaterally with Exparel. The  Nathanson retractor was removed under directvision and all CO2 evacuated and trochars removed. The mattress suture was secured at the umbilicus. Skin incisions were closed with subcuticular Monocryl and Liquiban. Sponge needle and instrument counts were correct.    Findings: As above  Estimated Blood Loss:  Minimal         Drains: none  Blood Given: none          Specimens: None        Complications:  * No complications entered in OR log *         Disposition: PACU - hemodynamically stable.         Condition: stable

## 2015-12-12 LAB — CBC WITH DIFFERENTIAL/PLATELET
Basophils Absolute: 0 10*3/uL (ref 0.0–0.1)
Basophils Relative: 0 %
EOS ABS: 0 10*3/uL (ref 0.0–0.7)
EOS PCT: 0 %
HCT: 33.6 % — ABNORMAL LOW (ref 36.0–46.0)
Hemoglobin: 11.2 g/dL — ABNORMAL LOW (ref 12.0–15.0)
LYMPHS ABS: 1.4 10*3/uL (ref 0.7–4.0)
Lymphocytes Relative: 17 %
MCH: 28.9 pg (ref 26.0–34.0)
MCHC: 33.3 g/dL (ref 30.0–36.0)
MCV: 86.8 fL (ref 78.0–100.0)
MONO ABS: 1 10*3/uL (ref 0.1–1.0)
Monocytes Relative: 12 %
Neutro Abs: 5.6 10*3/uL (ref 1.7–7.7)
Neutrophils Relative %: 71 %
PLATELETS: 189 10*3/uL (ref 150–400)
RBC: 3.87 MIL/uL (ref 3.87–5.11)
RDW: 13.1 % (ref 11.5–15.5)
WBC: 8 10*3/uL (ref 4.0–10.5)

## 2015-12-12 LAB — HEMOGLOBIN AND HEMATOCRIT, BLOOD
HCT: 33.5 % — ABNORMAL LOW (ref 36.0–46.0)
HEMOGLOBIN: 11.1 g/dL — AB (ref 12.0–15.0)

## 2015-12-12 NOTE — Plan of Care (Signed)
Problem: Food- and Nutrition-Related Knowledge Deficit (NB-1.1) Goal: Nutrition education Formal process to instruct or train a patient/client in a skill or to impart knowledge to help patients/clients voluntarily manage or modify food choices and eating behavior to maintain or improve health. Outcome: Completed/Met Date Met: 12/12/15 Nutrition Education Note  Received consult for diet education per DROP protocol.   Pt is s/p Procedure(s): LAPAROSCOPIC ROUX-EN-Y GASTRIC BYPASS HIATEL HERNIA REPAIR WITH UPPER ENDOSCOPY, Conversion from sleeve gastrectomy  Patient reports not tolerating vitamin and minerals PO. Pt approved to use multivitamin patch, calcium and vitamin D patches per Bariatric Nurse Coordinator. Pt reports being unaware of Vitamin B12 supplementation needs. Encouraged pt to discuss with MD vitamin B12 supplementation options. Labs are being reviewed. Pt prefers clear liquid protein supplements and reports drinking a fruit-based protein supplement which provides 17g protein per 8oz.  RD will continue to monitor for additional needs.  Discussed 2 week post op diet with pt. Emphasized that liquids must be non carbonated, non caffeinated, and sugar free. Fluid goals discussed. Reviewed progression of diet to include soft proteins at 7-10 days post-op. Pt to follow up with outpatient bariatric RD for further diet progression after 2 weeks. Multivitamins and minerals also reviewed. Teach back method used, pt expressed understanding, expect good compliance.   Diet: First 2 Weeks  You will see the dietitian about two (2) weeks after your surgery. The dietitian will increase the types of foods you can eat if you are handling liquids well:  If you have severe vomiting or nausea and cannot handle clear liquids lasting longer than 1 day, call your surgeon  Protein Shake  Drink at least 2 ounces of shake 5-6 times per day  Each serving of protein shakes (usually 8 - 12 ounces) should have a  minimum of:  15 grams of protein  And no more than 5 grams of carbohydrate  Goal for protein each day:  Men = 80 grams per day  Women = 60 grams per day  Protein powder may be added to fluids such as non-fat milk or Lactaid milk or Soy milk (limit to 35 grams added protein powder per serving)   Hydration  Slowly increase the amount of water and other clear liquids as tolerated (See Acceptable Fluids)  Slowly increase the amount of protein shake as tolerated  Sip fluids slowly and throughout the day  May use sugar substitutes in small amounts (no more than 6 - 8 packets per day; i.e. Splenda)   Fluid Goal  The first goal is to drink at least 8 ounces of protein shake/drink per day (or as directed by the nutritionist); some examples of protein shakes are [employer], [employer], EAS Edge HP, and Unjury. See handout from pre-op Bariatric Education Class:  Slowly increase the amount of protein shake you drink as tolerated  You may find it easier to slowly sip shakes throughout the day  It is important to get your proteins in first  Your fluid goal is to drink 64 - 100 ounces of fluid daily  It may take a few weeks to build up to this  32 oz (or more) should be clear liquids  And  32 oz (or more) should be full liquids (see below for examples)  Liquids should not contain sugar, caffeine, or carbonation   Clear Liquids:  Water or Sugar-free flavored water (i.e. Fruit H2O, Propel)  Decaffeinated coffee or tea (sugar-free)  Crystal Lite, Wyler's Lite, Minute Maid Lite  Sugar-free Jell-O  Bouillon or broth  Sugar-free Popsicle: *Less than 20 calories each; Limit 1 per day   Full Liquids:  Protein Shakes/Drinks + 2 choices per day of other full liquids  Full liquids must be:  No More Than 12 grams of Carbs per serving  No More Than 3 grams of Fat per serving  Strained low-fat cream soup  Non-Fat milk  Fat-free Lactaid Milk  Sugar-free yogurt (Dannon Lite & Fit, Greek yogurt)      Tasha Bibles, MS, RD, LDN Pager: [phone] After Hours Pager: [phone]

## 2015-12-12 NOTE — Progress Notes (Signed)
Patient alert and oriented, Post op day 1.  Provided support and encouragement.  Encouraged pulmonary toilet, ambulation and small sips of liquids.  All questions answered.  Will continue to monitor. 

## 2015-12-12 NOTE — Consult Note (Addendum)
   [facility] CM Inpatient Consult   12/12/2015  Tasha Pearson [facility] [birth year] [id number]    Spoke with Tasha Pearson at bedside on behalf of Link to [facility] Care Management program for [facility] Employees/dependents with [facility] insurance. Discussed Link to Wellness program for DM, HTN, HLD, asthma management. Tasha Pearson declines having any Link to Wellness needs at this time. Agreeable to post hospital discharge follow up call. Link to [employer] and contact information left at bedside. Confirmed best contact number as [phone]. Appreciative of visit.   Marthenia Rolling, MSN-Ed, RN,BSN [facility] Liaison [phone]

## 2015-12-12 NOTE — Progress Notes (Signed)
Patient ID: Tasha Pearson, female   DOB: 1961/01/22, 55 y.o.   MRN: [id number] 1 Day Post-Op  Subjective: Sore but no severe pain. Was very nauseated last night but this has resolved. Comfortable this morning.  Objective: Vital signs in last 24 hours: Temp:  [97.5 F (36.4 C)-98.5 F (36.9 C)] 98.5 F (36.9 C) (08/15 0644) Pulse Rate:  [65-94] 65 (08/15 0644) Resp:  [13-18] 18 (08/15 0644) BP: (116-136)/(66-80) 130/73 (08/15 0644) SpO2:  [100 %] 100 % (08/15 0644) Last BM Date: 12/10/15  Intake/Output from previous day: 08/14 0701 - 08/15 0700 In: 4793.8 [I.V.:4743.8; IV Piggyback:50] Out: 750 [Urine:700; Blood:50] Intake/Output this shift: Total I/O In: 60 [P.O.:60] Out: -   General appearance: alert, cooperative and no distress GI: normal findings: soft, non-tender Incision/Wound: clean and dry  Lab Results:   Recent Labs  12/11/15 1603 12/11/15 1947 12/12/15 0446  WBC 9.5  --  8.0  HGB 11.6* 11.4* 11.2*  HCT 34.2* 33.2* 33.6*  PLT 178  --  189   BMET  Recent Labs  12/11/15 1603  CREATININE 0.76     Studies/Results: No results found.  Anti-infectives: Anti-infectives    Start     Dose/Rate Route Frequency Ordered Stop   12/11/15 0535  levofloxacin (LEVAQUIN) IVPB 750 mg     750 mg 100 mL/hr over 90 Minutes Intravenous On call to O.R. 12/11/15 0535 12/11/15 0742   12/11/15 0535  metroNIDAZOLE (FLAGYL) IVPB 500 mg  Status:  Discontinued     500 mg 100 mL/hr over 60 Minutes Intravenous On call to O.R. 12/11/15 0535 12/11/15 1235   12/11/15 0535  ciprofloxacin (CIPRO) IVPB 400 mg  Status:  Discontinued     400 mg 200 mL/hr over 60 Minutes Intravenous On call to O.R. 12/11/15 0535 12/11/15 1235      Assessment/Plan: s/p Procedure(s): LAPAROSCOPIC ROUX-EN-Y GASTRIC BYPASS HIATEL HERNIA REPAIR WITH UPPER ENDOSCOPY Doing well without apparent complication. Ice and water ad lib. By mouth today. Increase activity.   LOS: 1 day    Tasha Pearson  T 12/12/2015

## 2015-12-12 NOTE — Progress Notes (Signed)
IV Access to Left arm infiltrated, IV team consult placed for IV insertion due to NPO status and need for medications

## 2015-12-13 LAB — CBC WITH DIFFERENTIAL/PLATELET
Basophils Absolute: 0 10*3/uL (ref 0.0–0.1)
Basophils Relative: 0 %
Eosinophils Absolute: 0.1 10*3/uL (ref 0.0–0.7)
Eosinophils Relative: 2 %
HEMATOCRIT: 32.1 % — AB (ref 36.0–46.0)
HEMOGLOBIN: 10.4 g/dL — AB (ref 12.0–15.0)
LYMPHS ABS: 1.7 10*3/uL (ref 0.7–4.0)
LYMPHS PCT: 30 %
MCH: 28.3 pg (ref 26.0–34.0)
MCHC: 32.4 g/dL (ref 30.0–36.0)
MCV: 87.2 fL (ref 78.0–100.0)
MONOS PCT: 11 %
Monocytes Absolute: 0.6 10*3/uL (ref 0.1–1.0)
NEUTROS ABS: 3.3 10*3/uL (ref 1.7–7.7)
NEUTROS PCT: 57 %
Platelets: 163 10*3/uL (ref 150–400)
RBC: 3.68 MIL/uL — AB (ref 3.87–5.11)
RDW: 13.2 % (ref 11.5–15.5)
WBC: 5.7 10*3/uL (ref 4.0–10.5)

## 2015-12-13 MED ORDER — OXYCODONE HCL 5 MG/5ML PO SOLN: 5.0000 mg | ORAL | 0 refills | Status: DC | PRN

## 2015-12-13 NOTE — Discharge Summary (Signed)
  Patient ID: Tasha Pearson [id number] 55 y.o. 1960/10/18  12/11/2015  Discharge date and time: 12/13/2015   Admitting Physician: Excell Seltzer T  Discharge Physician: Edward Jolly  Admission Diagnoses: [name]  Discharge Diagnoses: Same  Operations: Procedure(s):  Conversion from sleeve gastrectomy, LAPAROSCOPIC ROUX-EN-Y GASTRIC BYPASS HIATEL HERNIA REPAIR WITH UPPER ENDOSCOPY  Admission Condition: fair  Discharged Condition: good  Indication for Admission: Patient is a 55 year old female with a history of morbid obesity, status post laparoscopic sleeve gastrectomy In 2014 including repair of a moderate sized hiatal hernia. Initially she did well and has had excellent weight loss but over the last year or so has developed progressively worsening reflux and heartburn with postprandial nausea and vomiting.  Workup has shown a apparent small recurrent hiatal hernia and no other anatomic problems with her sleeve. This has been unresponsive to medical management and after a long discussion detailed elsewhere she is electively admitted for conversion to Roux-en-Y gastric bypass with repair of her hiatal hernia.  Hospital Course: On the morning of admission the patient underwent an uneventful repair of a recurrent hiatal hernia  Which actually was fairly large containing about 5 cm of her upper sleeve  andconversion to Roux-en-Y gastric bypass. She tolerated the procedure well and her postoperative course was uncomplicated. She had minimal discomfort and no nausea and tolerated ice and water on the first postoperative day. On the second postoperative day she is tolerating her protein shakes without any difficulty  And feels significantly better than prior to her surgery. Vital signs are stable and lab work is unremarkable. Abdomen is benign and wounds healing well. She is felt ready for discharge.  Disposition: Home  Patient Instructions:    Medication List    TAKE these  medications   acetaminophen 500 MG tablet Commonly known as:  TYLENOL Take 500 mg by mouth every 6 (six) hours as needed for pain.   dexlansoprazole 60 MG capsule Commonly known as:  DEXILANT Take 1 capsule (60 mg total) by mouth daily. What changed:  when to take this   levothyroxine 150 MCG tablet Commonly known as:  SYNTHROID, LEVOTHROID Take 150 mcg by mouth daily before breakfast.   oxyCODONE 5 MG/5ML solution Commonly known as:  ROXICODONE Take 5-10 mLs (5-10 mg total) by mouth every 4 (four) hours as needed for moderate pain or severe pain.       Activity: activity as tolerated Diet: bariatric protein shakes Wound Care: none needed  Follow-up:  With After Kavontae Pritchard in 3 weeks.  Signed: Edward Jolly MD, FACS  12/13/2015, 1:31 PM

## 2015-12-13 NOTE — Discharge Instructions (Signed)

## 2015-12-13 NOTE — Progress Notes (Signed)
Discharge instructions given to patient. Questions answered 

## 2015-12-13 NOTE — Progress Notes (Signed)
2 Days Post-Op  Subjective: No complaints. Tolerating protein shakes without any difficulty.  Objective: Vital signs in last 24 hours: Temp:  [97.8 F (36.6 C)-99.1 F (37.3 C)] 98 F (36.7 C) (08/16 1007) Pulse Rate:  [63-80] 80 (08/16 1007) Resp:  [15-16] 15 (08/16 1007) BP: (108-125)/(68-78) 111/78 (08/16 1007) SpO2:  [97 %-100 %] 99 % (08/16 1007) Last BM Date: 12/10/15  Intake/Output from previous day: 08/15 0701 - 08/16 0700 In: 3301.7 [P.O.:660; I.V.:2641.7] Out: 2700 [Urine:2700] Intake/Output this shift: Total I/O In: 537 [P.O.:537] Out: 800 [Urine:800]  General appearance: alert, cooperative and no distress GI: normal findings: soft, non-tender Incision/Wound: clean and dry  Lab Results:   Recent Labs  12/12/15 0446 12/12/15 1559 12/13/15 0501  WBC 8.0  --  5.7  HGB 11.2* 11.1* 10.4*  HCT 33.6* 33.5* 32.1*  PLT 189  --  163   BMET  Recent Labs  12/11/15 1603  CREATININE 0.76     Studies/Results: No results found.  Anti-infectives: Anti-infectives    Start     Dose/Rate Route Frequency Ordered Stop   12/11/15 0535  levofloxacin (LEVAQUIN) IVPB 750 mg     750 mg 100 mL/hr over 90 Minutes Intravenous On call to O.R. 12/11/15 0535 12/11/15 0742   12/11/15 0535  metroNIDAZOLE (FLAGYL) IVPB 500 mg  Status:  Discontinued     500 mg 100 mL/hr over 60 Minutes Intravenous On call to O.R. 12/11/15 0535 12/11/15 1235   12/11/15 0535  ciprofloxacin (CIPRO) IVPB 400 mg  Status:  Discontinued     400 mg 200 mL/hr over 60 Minutes Intravenous On call to O.R. 12/11/15 0535 12/11/15 1235      Assessment/Plan: s/p Procedure(s): Revision from sleeve gastrectomy LAPAROSCOPIC ROUX-EN-Y GASTRIC BYPASS HIATEL HERNIA REPAIR WITH UPPER ENDOSCOPY Doing very well without apparent complication. Okay for discharge.   LOS: 2 days    [name] 12/13/2015

## 2015-12-13 NOTE — Progress Notes (Signed)
Patient alert and oriented, pain is controlled. Patient is tolerating fluids, plan to advance to protein shake today, patient is tolerating well. Reviewed Gastric Bypass discharge instructions with patient and patient is able to articulate understanding. Provided information on BELT program, Support Group and [facility] outpatient pharmacy. All questions answered, will continue to monitor.

## 2015-12-14 ENCOUNTER — Other Ambulatory Visit: Payer: Self-pay | Admitting: *Deleted

## 2015-12-14 NOTE — Patient Outreach (Addendum)
[facility]) Care Management  12/14/2015  Tasha Pearson 1960/08/09 [id number]   Subjective: Telephone call to patient's home number, no answer, left HIPAA compliant voicemail message, and requested call back.    Objective: Per chart review: Patient hospitalized  12/11/15 -12/13/15 for GERD and status post Conversion from sleeve gastrectomy, LAPAROSCOPIC ROUX-EN-Y GAS/TRIC BYPASS HIATEL HERNIA REPAIR WITH UPPE/R ENDOSCOPY on 12/11/15.   Patient also has a history of status post LAPAROSCOPIC SLEEVE GASTRECTOMY  04/16/13, back pain, spinal surgery, thyroid disease, and hypercholesterolemia.    Assessment: Received [facility] Transition of care referral on 12/12/15.   Transition of care screening/ follow up pending, patient contact.   Plan: RNCM will call patient for 2nd attempt telephone outreach, transition of care screen/ follow up, within 10 business days, if no return call.   Charon Smedberg H. Annia Friendly, BSN, [place] Management [facility] Telephonic CM Phone: [phone] Fax: [phone]

## 2015-12-15 ENCOUNTER — Ambulatory Visit: Payer: Self-pay | Admitting: *Deleted

## 2015-12-19 ENCOUNTER — Other Ambulatory Visit: Payer: Self-pay | Admitting: *Deleted

## 2015-12-19 ENCOUNTER — Ambulatory Visit: Payer: Self-pay | Admitting: *Deleted

## 2015-12-19 NOTE — Patient Outreach (Signed)
[facility]) Care Management  12/19/2015  Tasha Pearson 06-17-1960 [id number]   Subjective: Telephone call to patient's home / mobile number, no answer, left HIPAA compliant voicemail message, and requested call back.    Objective: Per chart review: Patient hospitalized  12/11/15 -12/13/15 for GERD and status post Conversion from sleeve gastrectomy, LAPAROSCOPIC ROUX-EN-Y GAS/TRIC BYPASS HIATEL HERNIA REPAIR WITH UPPE/R ENDOSCOPY on 12/11/15.   Patient also has a history of status post LAPAROSCOPIC SLEEVE GASTRECTOMY  04/16/13, back pain, spinal surgery, thyroid disease, and hypercholesterolemia.    Assessment: Received [facility] Transition of care referral on 12/12/15. Transition of care screening/ follow up pending, patient contact.   Plan: RNCM will call patient for 3rd attempt telephone outreach, transition of care screen/ follow up, within 10 business days, if no return call.   Tasha Pearson H. Annia Friendly, BSN, [place] Management [facility] Telephonic CM Phone: [phone] Fax: [phone]

## 2015-12-20 ENCOUNTER — Ambulatory Visit: Payer: Self-pay | Admitting: *Deleted

## 2015-12-20 ENCOUNTER — Encounter: Payer: Self-pay | Admitting: *Deleted

## 2015-12-20 ENCOUNTER — Other Ambulatory Visit: Payer: Self-pay | Admitting: *Deleted

## 2015-12-20 NOTE — Patient Outreach (Addendum)
[facility]) Care Management  12/20/2015  Tasha Pearson Sep 22, 1960 [id number]   Subjective: Received voicemail message from patient, requesting call back on home phone. Telephone call to patient's home / mobile number times 2, spoke with patient, and HIPAA verified.  Patient gave [facility] verbal authorization to speak with sister Tasha Pearson) and daughter Tasha Pearson) regarding her healthcare needs as needed.  Discussed [facility] Care Management UMR Transition of care follow up and patient in agreement to complete follow up. Patient states she is doing well and has her follow up appointment with surgeon (Dr. Excell Seltzer) on 12/28/15.   States she has not seen primary MD in 8 -9 months and will call today to set up hospital follow up appointment.  Patient states she has family medical leave act in place through [employer] and short disability approval is pending.   States she is currently utilizing [facility] outpatient pharmacy for her prescriptions.   Patient states in agreement to receive [place] [facility] program and Advanced Directives information.   States her email address is [email or web address].  Patient states she does not have any transition of care, care coordination, disease management, disease monitoring, transportation, community resource, or pharmacy needs at this time.      Objective:Per chart review: Patient hospitalized 12/11/15 -12/13/15 for GERD and status post Conversion from sleeve gastrectomy, LAPAROSCOPIC ROUX-EN-Y GAS/TRIC BYPASS HIATEL HERNIA REPAIR WITH UPPE/R ENDOSCOPY on 12/11/15. Patient also has a history of status post LAPAROSCOPIC SLEEVE GASTRECTOMY 04/16/13, back pain, spinal surgery, thyroid disease, and hypercholesterolemia.   Assessment: Received UMR Transition of care referral on 12/12/15. Transition of care screening/ follow up completed.   Patient has no Telephonic RNCM needs at this time and will proceed with case closure.   Plan: RNCM will send patient  successful outreach letter, [facility] pamphlet, magnet, Advanced Directive packet, and EMMI video " Advance Directives" via email. RNCM will send case closure due to follow up completed / no care management needs request to [facility] [place] [facility].     Iniko Robles H. Annia Friendly, BSN, [place] Management [facility] Telephonic CM Phone: [phone] Fax: [phone]

## 2015-12-26 ENCOUNTER — Encounter: Payer: 59 | Attending: General Surgery

## 2015-12-26 DIAGNOSIS — Z713 Dietary counseling and surveillance: Secondary | ICD-10-CM | POA: Diagnosis not present

## 2015-12-26 DIAGNOSIS — Z6841 Body Mass Index (BMI) 40.0 and over, adult: Secondary | ICD-10-CM

## 2015-12-26 NOTE — Progress Notes (Signed)
Bariatric Class:  Appt start time: 1530 end time:  1630.  2 Week Post-Operative Nutrition Class  Patient was seen on 12/26/15 for Post-[facility]ative [facility]   Surgery date: 04/26/13 - Gastric Sleeve, 12/11/15 - RYGB revision Surgery type: RYGB  Starting weight at [facility] 282 lbs on 03/22/13 Weight today:162.6 lbs Weight change: NA Total weight lost: 119.4 lbs from 2014 Goal weight: 150 lbs  TANITA  BODY COMP RESULTS  05/11/13 03/07/14 12/26/15   BMI (kg/m^2) 41.5 24.5 27.1   Fat Mass (lbs) 131.5 42.5 56.8   Fat Free Mass (lbs) 118.0 105 105.8   Total Body Water (lbs) 86.5 77 74.2   The following the learning objectives were met by the patient during this course:  Identifies Phase 3A (Soft, High Proteins) Dietary Goals and will begin from 2 weeks post-operatively to 2 months post-operatively  Identifies appropriate sources of fluids and proteins   States protein recommendations and appropriate sources post-operatively  Identifies the need for appropriate texture modifications, mastication, and bite sizes when consuming solids  Identifies appropriate multivitamin and calcium sources post-operatively  Describes the need for physical activity post-operatively and will follow MD recommendations  States when to call healthcare provider regarding medication questions or post-operative complications  Handouts given during class include:  Phase 3A: Soft, High Protein Diet Handout  Follow-Up Plan: Patient will follow-up at [facility] in 6 weeks for 2 month post-op nutrition visit for diet advancement per MD.

## 2015-12-27 DIAGNOSIS — E890 Postprocedural hypothyroidism: Secondary | ICD-10-CM | POA: Diagnosis not present

## 2016-01-03 DIAGNOSIS — F419 Anxiety disorder, unspecified: Secondary | ICD-10-CM | POA: Diagnosis not present

## 2016-01-03 DIAGNOSIS — E559 Vitamin D deficiency, unspecified: Secondary | ICD-10-CM | POA: Diagnosis not present

## 2016-01-03 DIAGNOSIS — K219 Gastro-esophageal reflux disease without esophagitis: Secondary | ICD-10-CM | POA: Diagnosis not present

## 2016-01-03 DIAGNOSIS — D649 Anemia, unspecified: Secondary | ICD-10-CM | POA: Diagnosis not present

## 2016-01-03 DIAGNOSIS — E785 Hyperlipidemia, unspecified: Secondary | ICD-10-CM | POA: Diagnosis not present

## 2016-01-03 DIAGNOSIS — Z1159 Encounter for screening for other viral diseases: Secondary | ICD-10-CM | POA: Diagnosis not present

## 2016-01-03 MED FILL — VIT D2 1.25 MG (50,000 UNIT: 1.25 MG | 84 days supply | Qty: 24 | Fill #0

## 2016-01-03 MED FILL — FLUoxetine HCL 20 MG CAPS: 20 | 90 days supply | Qty: 90 | Fill #0

## 2016-01-24 ENCOUNTER — Other Ambulatory Visit: Payer: Self-pay | Admitting: Family Medicine

## 2016-01-24 DIAGNOSIS — Z1231 Encounter for screening mammogram for malignant neoplasm of breast: Secondary | ICD-10-CM

## 2016-01-25 ENCOUNTER — Ambulatory Visit
Admission: RE | Admit: 2016-01-25 | Discharge: 2016-01-25 | Disposition: A | Discharge status: Home / Self Care | Payer: 59 | Source: Ambulatory Visit | Attending: Family Medicine | Admitting: Family Medicine

## 2016-01-25 DIAGNOSIS — Z1231 Encounter for screening mammogram for malignant neoplasm of breast: Secondary | ICD-10-CM | POA: Diagnosis not present

## 2016-01-30 ENCOUNTER — Other Ambulatory Visit: Payer: Self-pay | Admitting: Family Medicine

## 2016-01-30 DIAGNOSIS — R928 Other abnormal and inconclusive findings on diagnostic imaging of breast: Secondary | ICD-10-CM

## 2016-02-20 ENCOUNTER — Other Ambulatory Visit: Payer: Self-pay | Admitting: Family Medicine

## 2016-02-20 DIAGNOSIS — R928 Other abnormal and inconclusive findings on diagnostic imaging of breast: Secondary | ICD-10-CM

## 2016-02-21 ENCOUNTER — Other Ambulatory Visit: Payer: 59

## 2016-02-21 ENCOUNTER — Inpatient Hospital Stay: Admission: RE | Admit: 2016-02-21 | Payer: 59 | Source: Ambulatory Visit

## 2016-02-29 ENCOUNTER — Other Ambulatory Visit: Payer: 59

## 2016-02-29 ENCOUNTER — Inpatient Hospital Stay: Admission: RE | Admit: 2016-02-29 | Payer: 59 | Source: Ambulatory Visit

## 2016-03-01 MED FILL — SYNTHROID 150 MCG TABLET: 150 | 30 days supply | Qty: 20 | Fill #1

## 2016-03-22 MED FILL — SYNTHROID 137 MCG TABLET: 137 | 30 days supply | Qty: 8 | Fill #0

## 2016-03-25 MED FILL — SYNTHROID 150 MCG TABLET: 150 | 30 days supply | Qty: 20 | Fill #2

## 2016-04-01 ENCOUNTER — Other Ambulatory Visit: Payer: 59

## 2016-04-01 ENCOUNTER — Inpatient Hospital Stay: Admission: RE | Admit: 2016-04-01 | Payer: 59 | Source: Ambulatory Visit

## 2016-04-01 DIAGNOSIS — E039 Hypothyroidism, unspecified: Secondary | ICD-10-CM | POA: Diagnosis not present

## 2016-04-01 DIAGNOSIS — M79641 Pain in right hand: Secondary | ICD-10-CM | POA: Diagnosis not present

## 2016-04-01 DIAGNOSIS — Z1159 Encounter for screening for other viral diseases: Secondary | ICD-10-CM | POA: Diagnosis not present

## 2016-04-01 DIAGNOSIS — F419 Anxiety disorder, unspecified: Secondary | ICD-10-CM | POA: Diagnosis not present

## 2016-04-01 DIAGNOSIS — M2550 Pain in unspecified joint: Secondary | ICD-10-CM | POA: Diagnosis not present

## 2016-04-01 DIAGNOSIS — E785 Hyperlipidemia, unspecified: Secondary | ICD-10-CM | POA: Diagnosis not present

## 2016-04-12 ENCOUNTER — Other Ambulatory Visit: Payer: Self-pay | Admitting: Family Medicine

## 2016-04-12 ENCOUNTER — Ambulatory Visit
Admission: RE | Admit: 2016-04-12 | Discharge: 2016-04-12 | Disposition: A | Discharge status: Home / Self Care | Payer: 59 | Source: Ambulatory Visit | Attending: Family Medicine | Admitting: Family Medicine

## 2016-04-12 DIAGNOSIS — R928 Other abnormal and inconclusive findings on diagnostic imaging of breast: Secondary | ICD-10-CM

## 2016-04-12 DIAGNOSIS — E039 Hypothyroidism, unspecified: Secondary | ICD-10-CM | POA: Diagnosis not present

## 2016-04-12 DIAGNOSIS — N6320 Unspecified lump in the left breast, unspecified quadrant: Secondary | ICD-10-CM

## 2016-04-12 MED FILL — MUPIROCIN 2% OINTMENT: 2 | 7 days supply | Qty: 22 | Fill #0

## 2016-04-12 MED FILL — PANTOPRAZOLE SOD DR 40 MG T: 40 | 90 days supply | Qty: 90 | Fill #0

## 2016-04-12 MED FILL — SYNTHROID 137 MCG TABLET: 137 | 90 days supply | Qty: 90 | Fill #0

## 2016-04-12 MED FILL — FLUoxetine HCL 40 MG CAPS: 40 | 90 days supply | Qty: 90 | Fill #0

## 2016-04-12 MED FILL — LIOTHYRONINE SOD 5 MCG TAB: 5 | 90 days supply | Qty: 90 | Fill #0

## 2016-04-12 MED FILL — DOXYCYCLINE HYCLATE 100 MG: 100 | 10 days supply | Qty: 20 | Fill #0

## 2016-04-12 MED FILL — traMADol HCL 50 MG TABS: 50 | 30 days supply | Qty: 90 | Fill #0

## 2016-04-15 ENCOUNTER — Other Ambulatory Visit: Payer: Self-pay | Admitting: Family Medicine

## 2016-04-15 DIAGNOSIS — N6320 Unspecified lump in the left breast, unspecified quadrant: Secondary | ICD-10-CM

## 2016-04-15 DIAGNOSIS — E041 Nontoxic single thyroid nodule: Secondary | ICD-10-CM

## 2016-04-16 ENCOUNTER — Ambulatory Visit
Admission: RE | Admit: 2016-04-16 | Discharge: 2016-04-16 | Disposition: A | Discharge status: Home / Self Care | Payer: 59 | Source: Ambulatory Visit | Attending: Family Medicine | Admitting: Family Medicine

## 2016-04-16 DIAGNOSIS — D242 Benign neoplasm of left breast: Secondary | ICD-10-CM | POA: Diagnosis not present

## 2016-04-16 DIAGNOSIS — N6320 Unspecified lump in the left breast, unspecified quadrant: Secondary | ICD-10-CM

## 2016-04-16 DIAGNOSIS — M654 Radial styloid tenosynovitis [de Quervain]: Secondary | ICD-10-CM | POA: Diagnosis not present

## 2016-05-28 DIAGNOSIS — M654 Radial styloid tenosynovitis [de Quervain]: Secondary | ICD-10-CM | POA: Diagnosis not present

## 2016-05-30 ENCOUNTER — Telehealth: Payer: 59 | Admitting: Family

## 2016-05-30 DIAGNOSIS — R1084 Generalized abdominal pain: Secondary | ICD-10-CM

## 2016-05-30 DIAGNOSIS — R6889 Other general symptoms and signs: Secondary | ICD-10-CM

## 2016-05-30 NOTE — Progress Notes (Signed)
Based on what you shared with me it looks like you have a serious condition that should be evaluated in a face to face office visit.  NOTE: Even if you have entered your credit card information for this eVisit, you will not be charged.   If you are having a true medical emergency please call 911.  If you need an urgent face to face visit, [facility] has four urgent care centers for your convenience.  If you need care fast and have a high deductible or no insurance consider:   [email or web address]  [phone]  [address], [address] [place], [place] [address] 8 am to 8 pm Monday-Friday 10 am to 4 pm Saturday-Sunday   The following sites will take your  insurance:    . [facility]  [phone] Get Driving Directions Find a Provider at this Location  [address] [place], [place] [address] . 10 am to 8 pm Monday-Friday . 12 pm to 8 pm Saturday-Sunday   . [facility]  [phone] Get Driving Directions Find a Provider at this Location  [address], [address] [place], [place] [address] . 8 am to 8 pm Monday-Friday . 9 am to 6 pm Saturday . 11 am to 6 pm Sunday   . [facility] [place]  [phone] Get Driving Directions  [address] [place], [place] [address] . 8 am to 8 pm Monday-Friday . 8 am to 4 pm Saturday-Sunday   Your e-visit answers were reviewed by a board certified advanced clinical practitioner to complete your personal care plan.  Thank you for using e-Visits.  

## 2016-05-31 ENCOUNTER — Ambulatory Visit (HOSPITAL_COMMUNITY)
Admission: EM | Admit: 2016-05-31 | Discharge: 2016-05-31 | Disposition: A | Discharge status: Home / Self Care | Payer: 59 | Attending: Family Medicine | Admitting: Family Medicine

## 2016-05-31 DIAGNOSIS — R05 Cough: Secondary | ICD-10-CM | POA: Diagnosis not present

## 2016-05-31 DIAGNOSIS — R509 Fever, unspecified: Secondary | ICD-10-CM

## 2016-05-31 DIAGNOSIS — J111 Influenza due to unidentified influenza virus with other respiratory manifestations: Secondary | ICD-10-CM

## 2016-05-31 DIAGNOSIS — M791 Myalgia: Secondary | ICD-10-CM

## 2016-05-31 DIAGNOSIS — R5383 Other fatigue: Secondary | ICD-10-CM

## 2016-05-31 DIAGNOSIS — R69 Illness, unspecified: Principal | ICD-10-CM

## 2016-05-31 MED ORDER — ONDANSETRON 4 MG PO TBDP
4.0000 mg | ORAL_TABLET | Freq: Once | ORAL | Status: AC
  Administered 2016-05-31: 4 mg via ORAL

## 2016-05-31 MED ORDER — ONDANSETRON 4 MG PO TBDP
ORAL_TABLET | ORAL | Status: AC
  Filled 2016-05-31: qty 1

## 2016-05-31 NOTE — ED Provider Notes (Signed)
[place]    CSN: [id number] Arrival date & time: 05/31/16  1004     History   Chief Complaint Chief Complaint  Patient presents with  . URI    HPI Tasha Pearson is a 56 y.o. female.   The history is provided by the patient.  Influenza  Presenting symptoms: cough, fatigue, fever, myalgias, rhinorrhea and vomiting   Severity:  Moderate Onset quality:  Sudden Duration:  1 day Chronicity:  New Relieved by:  None tried Worsened by:  Nothing Ineffective treatments:  None tried Associated symptoms: chills, decreased appetite, decreased physical activity and nasal congestion   Risk factors: sick contacts     Past Medical History:  Diagnosis Date  . Arthritis    feet  . Depression   . GERD (gastroesophageal reflux disease)   . H/O Graves' disease   . Headache(784.0)    rare  . Hiatal hernia   . Hypercholesteremia   . Hypothyroidism   . IBS (irritable bowel syndrome)    Pt reported on 01/18/13  . Morbid obesity ([place])   . Pneumonia 20's   hx of  . PONV (postoperative nausea and vomiting)    severe vomiting  . Schwannoma    left ear    Patient Active Problem List   Diagnosis Date Noted  . GERD (gastroesophageal reflux disease) 12/11/2015  . Iron deficiency anemia 11/28/2013  . Anxiety state, unspecified 11/28/2013  . Other specified acquired hypothyroidism 11/28/2013  . Gastroesophageal reflux disease without esophagitis 11/28/2013  . Bilateral low back pain without sciatica 11/28/2013  . Other and unspecified hyperlipidemia 11/28/2013  . Unspecified constipation 11/28/2013  . Unspecified vitamin D deficiency 11/28/2013  . Labial infection 09/03/2013  . Candidiasis of vulva 09/03/2013  . Morbid obesity with BMI of 45.0-49.9, adult ([place]) 04/26/2013  . Unspecified hypothyroidism 04/11/2013  . Morbid obesity ([place]) 01/01/2013  . GRAVE'S DISEASE 01/30/2007  . HYPERLIPIDEMIA 01/30/2007  . OBESITY NOS 01/30/2007  . TOBACCO USE 01/30/2007  . SYMPTOM,  DISTURBANCE, SLEEP NOS 01/30/2007  . HYPERGLYCEMIA 01/30/2007  . DIABETES MELLITUS, TYPE II 01/09/2007  . ANEMIA-NOS 01/09/2007  . DEPRESSION 01/09/2007  . COLONIC POLYPS, HX OF 01/09/2007    Past Surgical History:  Procedure Laterality Date  . ABDOMINAL HYSTERECTOMY     1 ovary left  . BIOPSY THYROID    . CESAREAN SECTION     x2  . CHOLECYSTECTOMY    . GASTRIC ROUX-EN-Y N/A 12/11/2015   Procedure: LAPAROSCOPIC ROUX-EN-Y GASTRIC BYPASS HIATEL HERNIA REPAIR WITH UPPER ENDOSCOPY;  Surgeon: Excell Seltzer, MD;  Location: [facility];  Service: General;  Laterality: N/A;  . LAPAROSCOPIC GASTRIC SLEEVE RESECTION N/A 04/26/2013   Procedure: LAPAROSCOPIC GASTRIC SLEEVE RESECTION;  Surgeon: Edward Jolly, MD;  Location: [facility];  Service: General;  Laterality: N/A;  . SPINE SURGERY     fusion, nerve release, and replaced a disc in lower back, l5-s1  . UPPER GI ENDOSCOPY N/A 04/26/2013   Procedure: UPPER GI ENDOSCOPY;  Surgeon: Edward Jolly, MD;  Location: [facility];  Service: General;  Laterality: N/A;    OB History    Gravida Para Term Preterm AB Living   2 2       2    SAB TAB Ectopic Multiple Live Births                   Home Medications    Prior to Admission medications   Medication Sig Start Date End Date Taking? Authorizing Provider  acetaminophen (TYLENOL) 500 MG tablet Take 500 mg by mouth every 6 (six) hours as needed for pain.   Yes Historical Provider, MD  dexlansoprazole (DEXILANT) 60 MG capsule Take 1 capsule (60 mg total) by mouth daily. Patient taking differently: Take 60 mg by mouth 2 (two) times daily.  08/22/15  Yes [name], MD  levothyroxine (SYNTHROID, LEVOTHROID) 150 MCG tablet Take 150 mcg by mouth daily before breakfast.   Yes Historical Provider, MD  oxyCODONE (ROXICODONE) 5 MG/5ML solution Take 5-10 mLs (5-10 mg total) by mouth every 4 (four) hours as needed for moderate pain or severe pain. 12/13/15  Yes Excell Seltzer, MD    Family  History Family History  Problem Relation Age of Onset  . Pancreatic cancer Mother   . Heart disease Father   . Diabetes Brother     Social History Social History  Substance Use Topics  . Smoking status: Former Smoker    Years: 10.00    Types: Cigarettes    Quit date: 04/29/2008  . Smokeless tobacco: Never Used  . Alcohol use 0.0 oz/week     Comment: occasional glass of wine     Allergies   Penicillins   Review of Systems Review of Systems  Constitutional: Positive for chills, decreased appetite, fatigue and fever.  HENT: Positive for congestion, postnasal drip and rhinorrhea.   Respiratory: Positive for cough.   Cardiovascular: Negative.   Gastrointestinal: Positive for vomiting.  Genitourinary: Negative.   Musculoskeletal: Positive for myalgias.  All other systems reviewed and are negative.    Physical Exam Triage Vital Signs ED Triage Vitals [05/31/16 1028]  Enc Vitals Group     BP 136/86     Pulse Rate 78     Resp 16     Temp 99.8 F (37.7 C)     Temp Source Oral     SpO2 97 %     Weight      Height      Head Circumference      Peak Flow      Pain Score      Pain Loc      Pain Edu?      Excl. in [place]?    No data found.   Updated Vital Signs BP 136/86 (BP Location: Left Arm)   Pulse 78   Temp 99.8 F (37.7 C) (Oral)   Resp 16   SpO2 97%   Visual Acuity Right Eye Distance:   Left Eye Distance:   Bilateral Distance:    Right Eye Near:   Left Eye Near:    Bilateral Near:     Physical Exam  Constitutional: She is oriented to person, place, and time. She appears well-developed and well-nourished. No distress.  HENT:  Right Ear: External ear normal.  Left Ear: External ear normal.  Nose: Nose normal.  Mouth/Throat: Oropharynx is clear and moist.  Eyes: Pupils are equal, round, and reactive to light.  Neck: Normal range of motion.  Cardiovascular: Normal rate and regular rhythm.   Pulmonary/Chest: Effort normal and breath sounds normal.    Abdominal: Soft. Bowel sounds are normal.  Lymphadenopathy:    She has no cervical adenopathy.  Neurological: She is alert and oriented to person, place, and time.  Skin: Skin is warm.  Nursing note and vitals reviewed.    UC Treatments / Results  Labs (all labs ordered are listed, but only abnormal results are displayed) Labs Reviewed - No data to display  EKG  EKG  Interpretation None       Radiology No results found.  Procedures Procedures (including critical care time)  Medications Ordered in UC Medications - No data to display   Initial Impression / Assessment and Plan / UC Course  I have reviewed the triage vital signs and the nursing notes.  Pertinent labs & imaging results that were available during my care of the patient were reviewed by me and considered in my medical decision making (see chart for details).       Final Clinical Impressions(s) / UC Diagnoses   Final diagnoses:  Influenza-like illness    New Prescriptions New Prescriptions   No medications on file     Billy Fischer, MD 05/31/16 1059

## 2016-05-31 NOTE — ED Triage Notes (Signed)
C/o flu like sx States she has body aches. Nasal congestion and fever

## 2016-06-10 DIAGNOSIS — M25531 Pain in right wrist: Secondary | ICD-10-CM | POA: Diagnosis not present

## 2016-06-10 DIAGNOSIS — M654 Radial styloid tenosynovitis [de Quervain]: Secondary | ICD-10-CM | POA: Diagnosis not present

## 2016-06-17 ENCOUNTER — Other Ambulatory Visit: Payer: 59

## 2016-06-21 ENCOUNTER — Ambulatory Visit
Admission: RE | Admit: 2016-06-21 | Discharge: 2016-06-21 | Disposition: A | Discharge status: Home / Self Care | Payer: 59 | Source: Ambulatory Visit | Attending: Family Medicine | Admitting: Family Medicine

## 2016-06-21 DIAGNOSIS — E041 Nontoxic single thyroid nodule: Secondary | ICD-10-CM

## 2016-06-21 DIAGNOSIS — E042 Nontoxic multinodular goiter: Secondary | ICD-10-CM | POA: Diagnosis not present

## 2016-06-24 DIAGNOSIS — M654 Radial styloid tenosynovitis [de Quervain]: Secondary | ICD-10-CM | POA: Diagnosis not present

## 2016-06-24 DIAGNOSIS — M25531 Pain in right wrist: Secondary | ICD-10-CM | POA: Diagnosis not present

## 2016-06-28 ENCOUNTER — Ambulatory Visit (HOSPITAL_COMMUNITY)
Admission: EM | Admit: 2016-06-28 | Discharge: 2016-06-28 | Disposition: A | Discharge status: Home / Self Care | Payer: 59 | Attending: Family Medicine | Admitting: Family Medicine

## 2016-06-28 ENCOUNTER — Encounter (HOSPITAL_COMMUNITY): Payer: Self-pay | Admitting: *Deleted

## 2016-06-28 DIAGNOSIS — N3001 Acute cystitis with hematuria: Secondary | ICD-10-CM | POA: Diagnosis not present

## 2016-06-28 DIAGNOSIS — R319 Hematuria, unspecified: Secondary | ICD-10-CM | POA: Insufficient documentation

## 2016-06-28 DIAGNOSIS — R350 Frequency of micturition: Secondary | ICD-10-CM | POA: Diagnosis not present

## 2016-06-28 LAB — POCT URINALYSIS DIP (DEVICE)
GLUCOSE, UA: NEGATIVE mg/dL
Ketones, ur: NEGATIVE mg/dL
NITRITE: NEGATIVE
PH: 5.5 (ref 5.0–8.0)
Protein, ur: NEGATIVE mg/dL
Specific Gravity, Urine: >=1.03 (ref 1.005–1.030)
Urobilinogen, UA: 0.2 mg/dL (ref 0.0–1.0)

## 2016-06-28 MED ORDER — CEPHALEXIN 500 MG PO CAPS: 500.0000 mg | ORAL_CAPSULE | Freq: Four times a day (QID) | ORAL | 0 refills | Status: DC

## 2016-06-28 MED ORDER — PHENAZOPYRIDINE HCL 200 MG PO TABS: 200.0000 mg | ORAL_TABLET | Freq: Three times a day (TID) | ORAL | 0 refills | Status: DC | PRN

## 2016-06-28 NOTE — Discharge Instructions (Addendum)
You are being treated today for a urinary tract infection. I have prescribed Keflex, take 1 tablet 4 times a day for 5 days. I have also prescribed Pyridium. Take 1 tablet a day 3 times a day for 2 days. Your urine will be sent for culture and you will be notified should any change in therapy be needed. Drink plenty of fluids and rest. Should your symptoms fail to resolve, follow up with your primary care provider or return to clinic.  °

## 2016-06-28 NOTE — ED Provider Notes (Signed)
CSN: [id number]     Arrival date & time 06/28/16  1725 History   None    Chief Complaint  Patient presents with  . Urinary Tract Infection   (Consider location/radiation/quality/duration/timing/severity/associated sxs/prior Treatment) The history is provided by the patient.  Urinary Tract Infection  Pain quality:  Aching, sharp and burning Pain severity:  Moderate Onset quality:  Sudden Duration:  2 days Timing:  Constant Progression:  Unchanged Chronicity:  New Recent urinary tract infections: no   Relieved by:  Nothing Worsened by:  Nothing Ineffective treatments:  None tried Urinary symptoms: discolored urine, foul-smelling urine, frequent urination and hematuria   Urinary symptoms: no hesitancy and no bladder incontinence   Associated symptoms: no abdominal pain, no fever, no flank pain, no nausea, no vaginal discharge and no vomiting   Risk factors: recurrent urinary tract infections     Past Medical History:  Diagnosis Date  . Arthritis    feet  . Depression   . GERD (gastroesophageal reflux disease)   . H/O Graves' disease   . Headache(784.0)    rare  . Hiatal hernia   . Hypercholesteremia   . Hypothyroidism   . IBS (irritable bowel syndrome)    Pt reported on 01/18/13  . Morbid obesity ([place])   . Pneumonia 20's   hx of  . PONV (postoperative nausea and vomiting)    severe vomiting  . Schwannoma    left ear   Past Surgical History:  Procedure Laterality Date  . ABDOMINAL HYSTERECTOMY     1 ovary left  . BIOPSY THYROID    . CESAREAN SECTION     x2  . CHOLECYSTECTOMY    . GASTRIC ROUX-EN-Y N/A 12/11/2015   Procedure: LAPAROSCOPIC ROUX-EN-Y GASTRIC BYPASS HIATEL HERNIA REPAIR WITH UPPER ENDOSCOPY;  Surgeon: Excell Seltzer, MD;  [facility]: [facility];  Service: General;  Laterality: N/A;  . LAPAROSCOPIC GASTRIC SLEEVE RESECTION N/A 04/26/2013   Procedure: LAPAROSCOPIC GASTRIC SLEEVE RESECTION;  Surgeon: Edward Jolly, MD;  [facility]: [facility];  Service:  General;  Laterality: N/A;  . SPINE SURGERY     fusion, nerve release, and replaced a disc in lower back, l5-s1  . UPPER GI ENDOSCOPY N/A 04/26/2013   Procedure: UPPER GI ENDOSCOPY;  Surgeon: Edward Jolly, MD;  [facility]: [facility];  Service: General;  Laterality: N/A;   Family History  Problem Relation Age of Onset  . Pancreatic cancer Mother   . Heart disease Father   . Diabetes Brother    Social History  Substance Use Topics  . Smoking status: Former Smoker    Years: 10.00    Types: Cigarettes    Quit date: 04/29/2008  . Smokeless tobacco: Never Used  . Alcohol use 0.0 oz/week     Comment: occasional glass of wine   OB History    Gravida Para Term Preterm AB Living   2 2       2    SAB TAB Ectopic Multiple Live Births                 Review of Systems  Reason unable to perform ROS: as covered in HPI.  Constitutional: Negative for fever.  Gastrointestinal: Negative for abdominal pain, nausea and vomiting.  Genitourinary: Negative for flank pain and vaginal discharge.  All other systems reviewed and are negative.   Allergies  Penicillins  Home Medications   Prior to Admission medications   Medication Sig Start Date End Date Taking? Authorizing Provider  acetaminophen (TYLENOL) 500 MG  tablet Take 500 mg by mouth every 6 (six) hours as needed for pain.    Historical Provider, MD  cephALEXin (KEFLEX) 500 MG capsule Take 1 capsule (500 mg total) by mouth 4 (four) times daily. 06/28/16   Barnet Glasgow, NP  dexlansoprazole (DEXILANT) 60 MG capsule Take 1 capsule (60 mg total) by mouth daily. Patient taking differently: Take 60 mg by mouth 2 (two) times daily.  08/22/15   [name], MD  levothyroxine (SYNTHROID, LEVOTHROID) 150 MCG tablet Take 150 mcg by mouth daily before breakfast.    Historical Provider, MD  oxyCODONE (ROXICODONE) 5 MG/5ML solution Take 5-10 mLs (5-10 mg total) by mouth every 4 (four) hours as needed for moderate pain or severe pain. 12/13/15    Excell Seltzer, MD  phenazopyridine (PYRIDIUM) 200 MG tablet Take 1 tablet (200 mg total) by mouth 3 (three) times daily as needed for pain. 06/28/16   Barnet Glasgow, NP   Meds Ordered and Administered this Visit  Medications - No data to display  BP 141/89 (BP Location: Left Arm)   Pulse 62   Temp 98.4 F (36.9 C) (Oral)   Resp 16   SpO2 100%  No data found.   Physical Exam  Constitutional: She is oriented to person, place, and time. She appears well-developed and well-nourished. No distress.  Cardiovascular: Normal rate and regular rhythm.   Pulmonary/Chest: Effort normal and breath sounds normal.  Abdominal: Soft. Bowel sounds are normal. She exhibits no distension and no mass. There is no tenderness. There is no guarding and no CVA tenderness.  Neurological: She is alert and oriented to person, place, and time.  Skin: Skin is warm and dry. Capillary refill takes less than 2 seconds. She is not diaphoretic.  Psychiatric: She has a normal mood and affect.  Nursing note and vitals reviewed.   Urgent Care Course     Procedures (including critical care time)  Labs Review Labs Reviewed  POCT URINALYSIS DIP (DEVICE) - Abnormal; Notable for the following:       Result Value   Bilirubin Urine SMALL (*)    Hgb urine dipstick TRACE (*)    Leukocytes, UA SMALL (*)    All other components within normal limits  URINE CULTURE    Imaging Review No results found.   Visual Acuity Review  Right Eye Distance:   Left Eye Distance:   Bilateral Distance:    Right Eye Near:   Left Eye Near:    Bilateral Near:         MDM   1. Acute cystitis with hematuria    You are being treated today for a urinary tract infection. I have prescribed Keflex, take 1 tablet 4 times a day for 5 days. I have also prescribed Pyridium. Take 1 tablet a day 3 times a day for 2 days. Your urine will be sent for culture and you will be notified should any change in therapy be needed. Drink  plenty of fluids and rest. Should your symptoms fail to resolve, follow up with your primary care provider or return to clinic.      Barnet Glasgow, NP 06/28/16 1840

## 2016-06-28 NOTE — ED Triage Notes (Signed)
Pt  Reports frequent  Burning  Urination   Strong  Odor  In  Am      X  2  Days   ago

## 2016-06-30 LAB — URINE CULTURE: Culture: NO GROWTH

## 2016-07-01 MED FILL — PHENAZOPYRIDINE 200 MG TAB: 200 | 4 days supply | Qty: 10 | Fill #0

## 2016-07-08 ENCOUNTER — Encounter (HOSPITAL_COMMUNITY): Payer: Self-pay | Admitting: *Deleted

## 2016-07-08 ENCOUNTER — Ambulatory Visit (HOSPITAL_COMMUNITY)
Admission: EM | Admit: 2016-07-08 | Discharge: 2016-07-08 | Disposition: A | Discharge status: Home / Self Care | Payer: 59 | Attending: Emergency Medicine | Admitting: Emergency Medicine

## 2016-07-08 DIAGNOSIS — R300 Dysuria: Secondary | ICD-10-CM | POA: Diagnosis not present

## 2016-07-08 DIAGNOSIS — R350 Frequency of micturition: Secondary | ICD-10-CM | POA: Insufficient documentation

## 2016-07-08 DIAGNOSIS — N760 Acute vaginitis: Secondary | ICD-10-CM

## 2016-07-08 DIAGNOSIS — M545 Low back pain: Secondary | ICD-10-CM | POA: Insufficient documentation

## 2016-07-08 DIAGNOSIS — B3731 Acute candidiasis of vulva and vagina: Secondary | ICD-10-CM

## 2016-07-08 DIAGNOSIS — B373 Candidiasis of vulva and vagina: Secondary | ICD-10-CM

## 2016-07-08 LAB — POCT URINALYSIS DIP (DEVICE)
BILIRUBIN URINE: NEGATIVE
Glucose, UA: NEGATIVE mg/dL
Ketones, ur: NEGATIVE mg/dL
NITRITE: NEGATIVE
PH: 6 (ref 5.0–8.0)
Protein, ur: NEGATIVE mg/dL
SPECIFIC GRAVITY, URINE: 1.015 (ref 1.005–1.030)
Urobilinogen, UA: 0.2 mg/dL (ref 0.0–1.0)

## 2016-07-08 MED ORDER — FLUCONAZOLE 150 MG PO TABS: ORAL_TABLET | ORAL | 0 refills | Status: DC

## 2016-07-08 MED FILL — FLUCONAZOLE 150 MG TABLET: 150 | 4 days supply | Qty: 3 | Fill #0

## 2016-07-08 NOTE — ED Triage Notes (Signed)
Symptoms  Of  Burning   Of  Urination  As  Well  As  Foul  Odor  And  Lower  Back pain       Symptoms  X  2  Weeks      Seen  ucc      At that  Time     And  Was  Given rx  That  Did  Not  Clear the  Symptoms   up

## 2016-07-08 NOTE — ED Provider Notes (Signed)
CSN: [id number]     Arrival date & time 07/08/16  1501 History   First MD Initiated Contact with Patient 07/08/16 1657     Chief Complaint  Patient presents with  . Urinary Tract Infection   (Consider location/radiation/quality/duration/timing/severity/associated sxs/prior Treatment) 56 year old female returns to the urgent care with dysuria and low back pain and urinary frequency. She states that when she urinates it feels like she is on fire. She was seen here a little over week ago for UTI symptoms and was treated with Keflex. She completed the course and at no time did she feel better. She did take the Pyridium which does help some. The culture did not grow out any organisms.      Past Medical History:  Diagnosis Date  . Arthritis    feet  . Depression   . GERD (gastroesophageal reflux disease)   . H/O Graves' disease   . Headache(784.0)    rare  . Hiatal hernia   . Hypercholesteremia   . Hypothyroidism   . IBS (irritable bowel syndrome)    Pt reported on 01/18/13  . Morbid obesity ([place])   . Pneumonia 20's   hx of  . PONV (postoperative nausea and vomiting)    severe vomiting  . Schwannoma    left ear   Past Surgical History:  Procedure Laterality Date  . ABDOMINAL HYSTERECTOMY     1 ovary left  . BIOPSY THYROID    . CESAREAN SECTION     x2  . CHOLECYSTECTOMY    . GASTRIC ROUX-EN-Y N/A 12/11/2015   Procedure: LAPAROSCOPIC ROUX-EN-Y GASTRIC BYPASS HIATEL HERNIA REPAIR WITH UPPER ENDOSCOPY;  Surgeon: Excell Seltzer, MD;  Location: [facility];  Service: General;  Laterality: N/A;  . LAPAROSCOPIC GASTRIC SLEEVE RESECTION N/A 04/26/2013   Procedure: LAPAROSCOPIC GASTRIC SLEEVE RESECTION;  Surgeon: Edward Jolly, MD;  Location: [facility];  Service: General;  Laterality: N/A;  . SPINE SURGERY     fusion, nerve release, and replaced a disc in lower back, l5-s1  . UPPER GI ENDOSCOPY N/A 04/26/2013   Procedure: UPPER GI ENDOSCOPY;  Surgeon: Edward Jolly, MD;   Location: [facility];  Service: General;  Laterality: N/A;   Family History  Problem Relation Age of Onset  . Pancreatic cancer Mother   . Heart disease Father   . Diabetes Brother    Social History  Substance Use Topics  . Smoking status: Former Smoker    Years: 10.00    Types: Cigarettes    Quit date: 04/29/2008  . Smokeless tobacco: Never Used  . Alcohol use 0.0 oz/week     Comment: occasional glass of wine   OB History    Gravida Para Term Preterm AB Living   2 2       2    SAB TAB Ectopic Multiple Live Births                 Review of Systems  Constitutional: Negative.  Negative for fever.  Respiratory: Negative.   Gastrointestinal: Negative.   Genitourinary: Positive for dysuria. Negative for genital sores, pelvic pain, vaginal bleeding and vaginal discharge.  Skin: Negative.   Neurological: Negative.   Psychiatric/Behavioral: Negative.   All other systems reviewed and are negative.   Allergies  Penicillins  Home Medications   Prior to Admission medications   Medication Sig Start Date End Date Taking? Authorizing Provider  acetaminophen (TYLENOL) 500 MG tablet Take 500 mg by mouth every 6 (six) hours as needed for pain.  Historical Provider, MD  cephALEXin (KEFLEX) 500 MG capsule Take 1 capsule (500 mg total) by mouth 4 (four) times daily. 06/28/16   Barnet Glasgow, NP  dexlansoprazole (DEXILANT) 60 MG capsule Take 1 capsule (60 mg total) by mouth daily. Patient taking differently: Take 60 mg by mouth 2 (two) times daily.  08/22/15   [name], MD  fluconazole (DIFLUCAN) 150 MG tablet 1 tab po now and in 48 hours. Repeat 1 tab on 4th day if not improving. 07/08/16   Janne Napoleon, NP  levothyroxine (SYNTHROID, LEVOTHROID) 150 MCG tablet Take 150 mcg by mouth daily before breakfast.    Historical Provider, MD  oxyCODONE (ROXICODONE) 5 MG/5ML solution Take 5-10 mLs (5-10 mg total) by mouth every 4 (four) hours as needed for moderate pain or severe pain. 12/13/15    Excell Seltzer, MD  phenazopyridine (PYRIDIUM) 200 MG tablet Take 1 tablet (200 mg total) by mouth 3 (three) times daily as needed for pain. 06/28/16   Barnet Glasgow, NP   Meds Ordered and Administered this Visit  Medications - No data to display  BP 132/74 (BP Location: Right Arm)   Pulse 72   Temp 98.6 F (37 C) (Oral)   Resp 18   SpO2 100%  No data found.   Physical Exam  Constitutional: She is oriented to person, place, and time. She appears well-developed and well-nourished. No distress.  Eyes: EOM are normal.  Neck: Neck supple.  Cardiovascular: Normal rate.   Pulmonary/Chest: Effort normal.  Genitourinary: No vaginal discharge found.  Genitourinary Comments: Cheral Bay RN present External examination of the female genitalia reveals old spherical nodules of the rim of the labia majora. The mucosa of the introitus and the labia minora is erythematous and moist. The meatus is slightly inflamed as well as the posterior fourchette. No discharge is seen. All of the area that is erythematous is tender.  Musculoskeletal: She exhibits no edema.  Neurological: She is alert and oriented to person, place, and time.  Skin: Skin is warm.  Psychiatric: She has a normal mood and affect.    Urgent Care Course     Procedures (including critical care time)  Labs Review Labs Reviewed  POCT URINALYSIS DIP (DEVICE) - Abnormal; Notable for the following:       Result Value   Hgb urine dipstick TRACE (*)    Leukocytes, UA SMALL (*)    All other components within normal limits  URINE CYTOLOGY ANCILLARY ONLY    Imaging Review No results found.   Visual Acuity Review  Right Eye Distance:   Left Eye Distance:   Bilateral Distance:    Right Eye Near:   Left Eye Near:    Bilateral Near:         MDM   1. Dysuria   2. Acute vaginitis   3. Candida vaginitis    There is much irritation and redness and inflammation on the outside of the vagina and meatus from which the  urine exits. This appears most like a yeast type infection. You are being treated with Diflucan and recommend that you obtain a antifungal cream such as Monistat to place a burning area externally. Follow-up with your primary care doctor if needed. Meds ordered this encounter  Medications  . fluconazole (DIFLUCAN) 150 MG tablet    Sig: 1 tab po now and in 48 hours. Repeat 1 tab on 4th day if not improving.    Dispense:  3 tablet    Refill:  0  Order Specific Question:   Supervising Provider    Answer:   Melynda Ripple [[id number]]      Janne Napoleon, NP 07/08/16 1742

## 2016-07-08 NOTE — Discharge Instructions (Signed)
There is much irritation and redness and inflammation on the outside of the vagina and meatus from which the urine exits. This appears most like a yeast type infection. You are being treated with Diflucan and recommend that you obtain a antifungal cream such as Monistat to place a burning area externally. Follow-up with your primary care doctor if needed.

## 2016-07-10 LAB — CERVICOVAGINAL ANCILLARY ONLY: Wet Prep (BD Affirm): NEGATIVE

## 2016-07-18 DIAGNOSIS — N760 Acute vaginitis: Secondary | ICD-10-CM | POA: Diagnosis not present

## 2016-07-18 DIAGNOSIS — B9689 Other specified bacterial agents as the cause of diseases classified elsewhere: Secondary | ICD-10-CM | POA: Diagnosis not present

## 2016-07-18 DIAGNOSIS — R300 Dysuria: Secondary | ICD-10-CM | POA: Diagnosis not present

## 2016-07-18 DIAGNOSIS — N3090 Cystitis, unspecified without hematuria: Secondary | ICD-10-CM | POA: Diagnosis not present

## 2016-07-18 DIAGNOSIS — N952 Postmenopausal atrophic vaginitis: Secondary | ICD-10-CM | POA: Diagnosis not present

## 2016-07-18 MED FILL — LIOTHYRONINE SODIUM 5 MCG T: 5 | 90 days supply | Qty: 90 | Fill #1

## 2016-07-18 MED FILL — SYNTHROID 137 MCG TABLET: 137 | 90 days supply | Qty: 90 | Fill #1

## 2016-07-18 MED FILL — ESTRADIOL 0.1 MG/GM CRM: 0.1 | 42 days supply | Qty: 43 | Fill #0

## 2016-08-14 DIAGNOSIS — H524 Presbyopia: Secondary | ICD-10-CM | POA: Diagnosis not present

## 2016-09-11 ENCOUNTER — Encounter: Payer: Self-pay | Admitting: Gynecology

## 2016-11-08 MED FILL — LIOTHYRONINE SODIUM 5 MCG T: 5 | 90 days supply | Qty: 90 | Fill #2

## 2016-11-08 MED FILL — SYNTHROID 137 MCG TABLET: 137 | 90 days supply | Qty: 90 | Fill #2

## 2017-03-31 MED FILL — LIOTHYRONINE SODIUM 5 MCG T: 5 | 90 days supply | Qty: 90 | Fill #3

## 2017-03-31 MED FILL — SYNTHROID 137 MCG TABLET: 137 | 90 days supply | Qty: 90 | Fill #3

## 2017-05-24 IMAGING — US US SOFT TISSUE HEAD/NECK
1 series · 14 of 25 positions shown · non-contrast
Comparison: MR 04/19/2015 and previous

CLINICAL DATA: Thyroid nodule Noted on recent cervical MR

EXAM:
THYROID ULTRASOUND
TECHNIQUE: Ultrasound examination of the thyroid gland and adjacent soft
tissues was performed.

[Series 1: us soft tissue head/neck · 0.08mm/px · 14 of 48 slices shown]
[im 1/48]
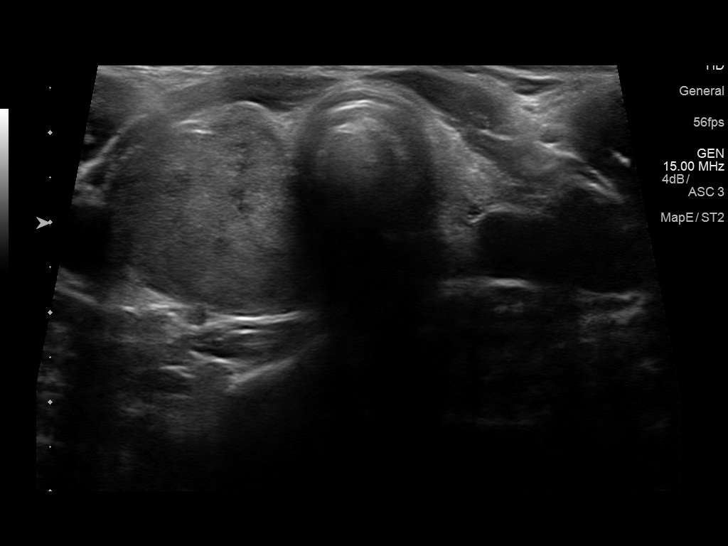
[im 4/48]
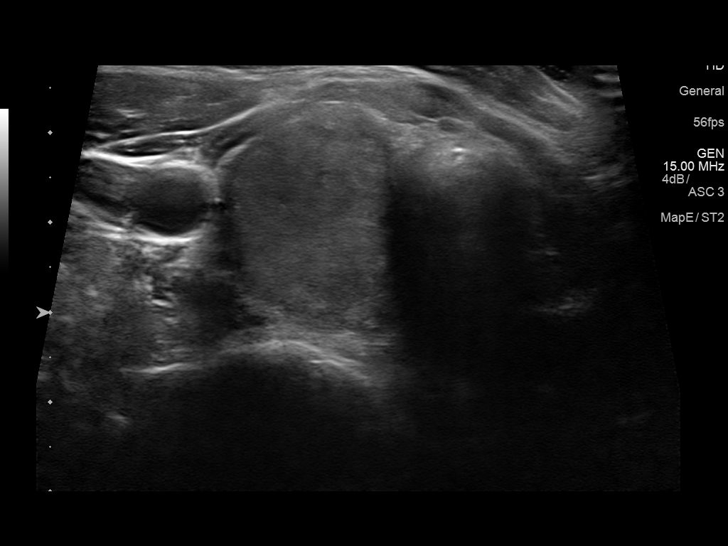
[im 8/48]
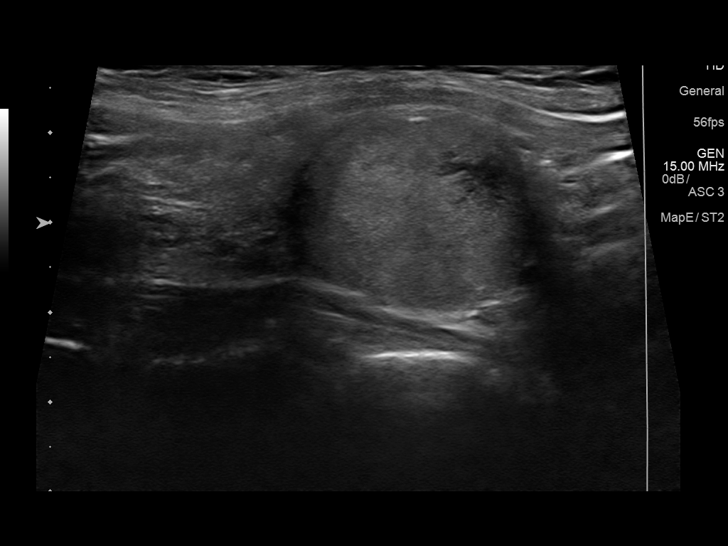
[im 12/48]
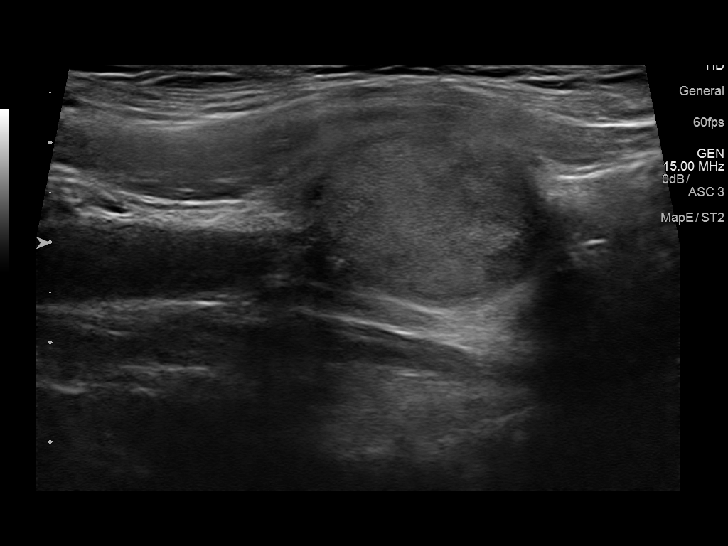
[im 16/48]
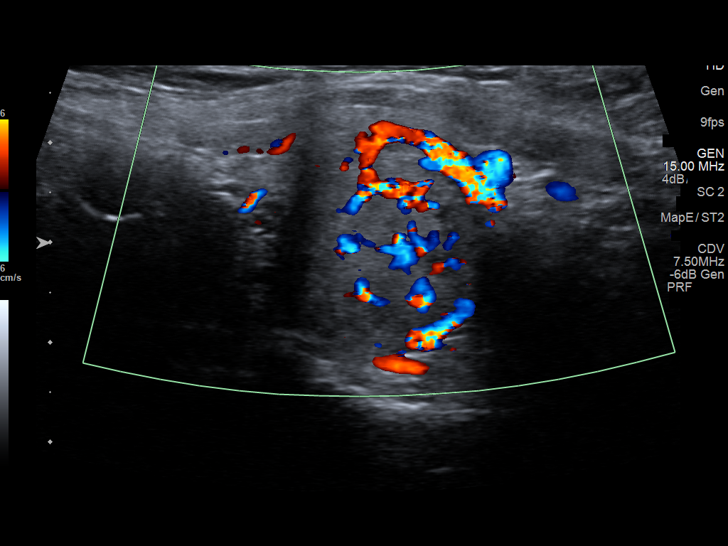
[im 18/48]
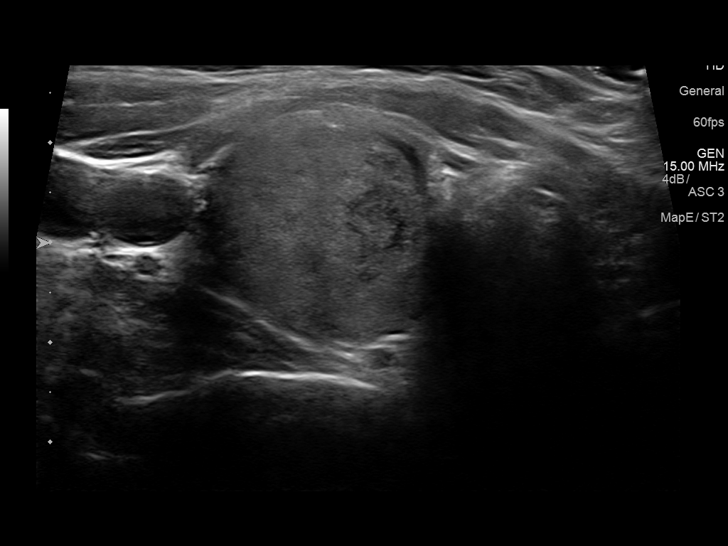
[im 22/48]
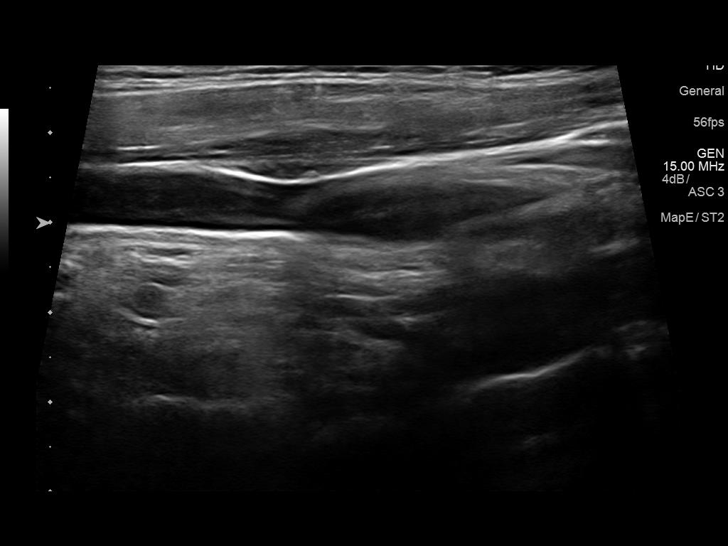
[im 26/48]
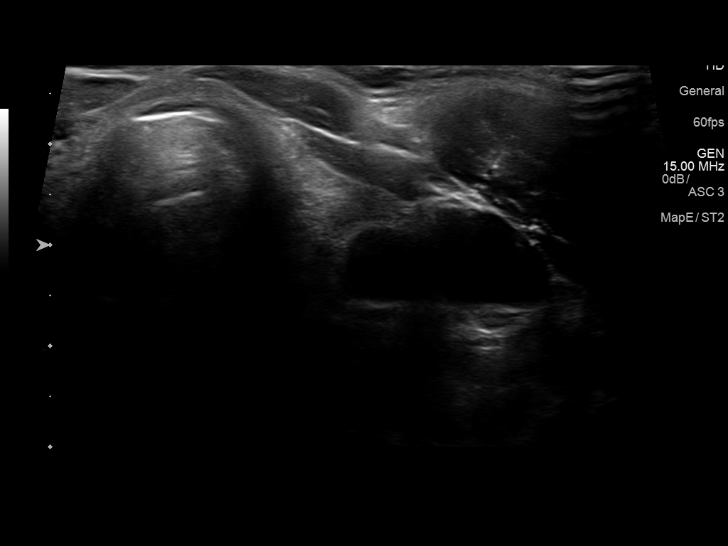
[im 30/48]
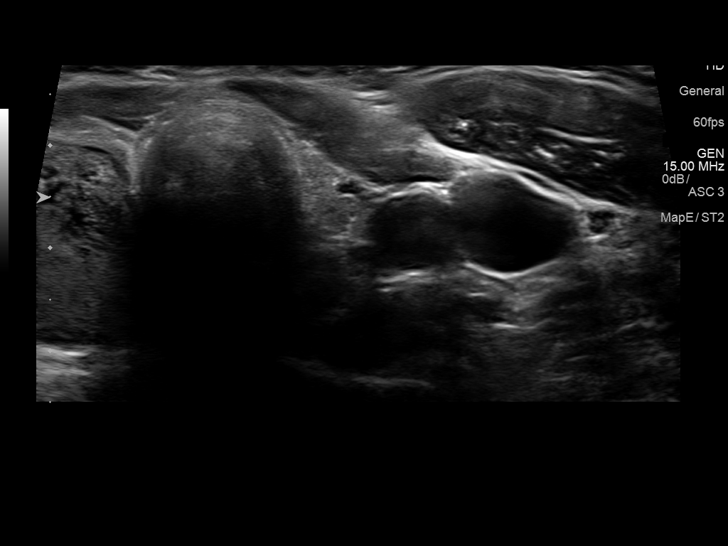
[im 32/48]
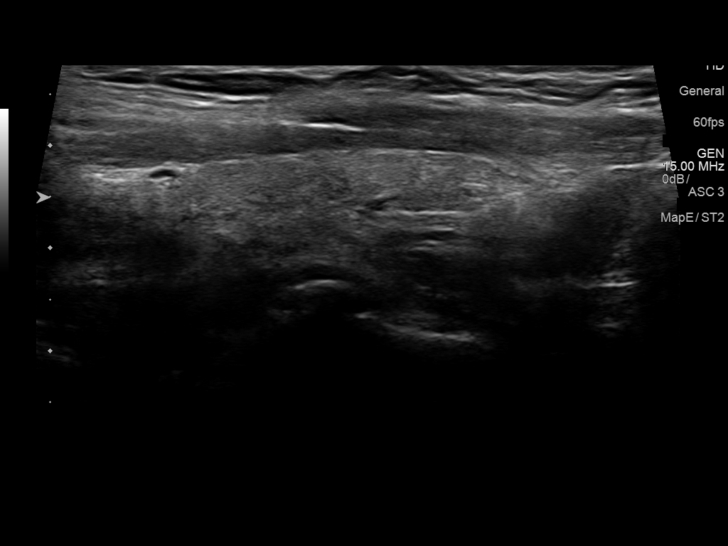
[im 36/48]
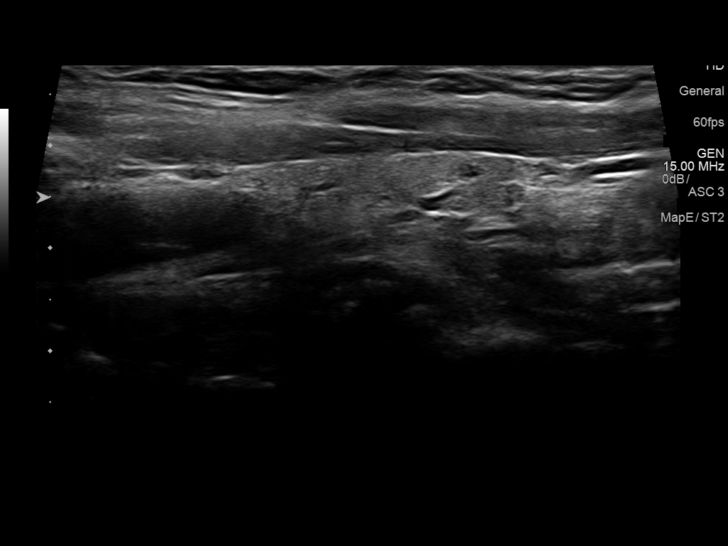
[im 40/48]
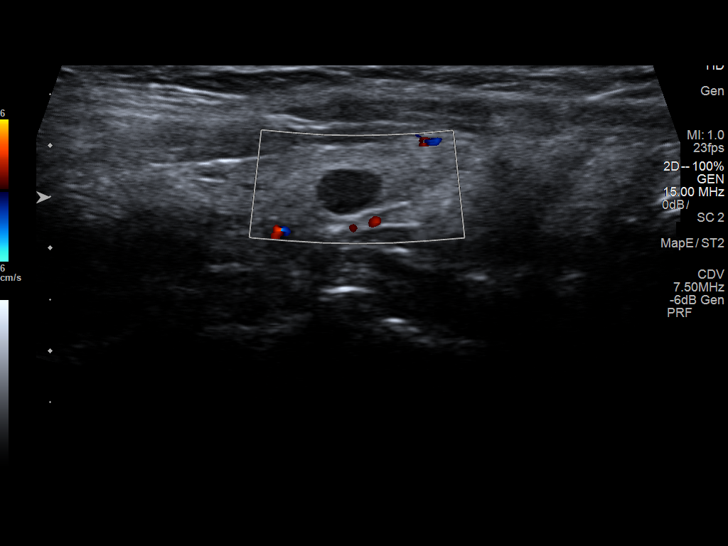
[im 44/48]
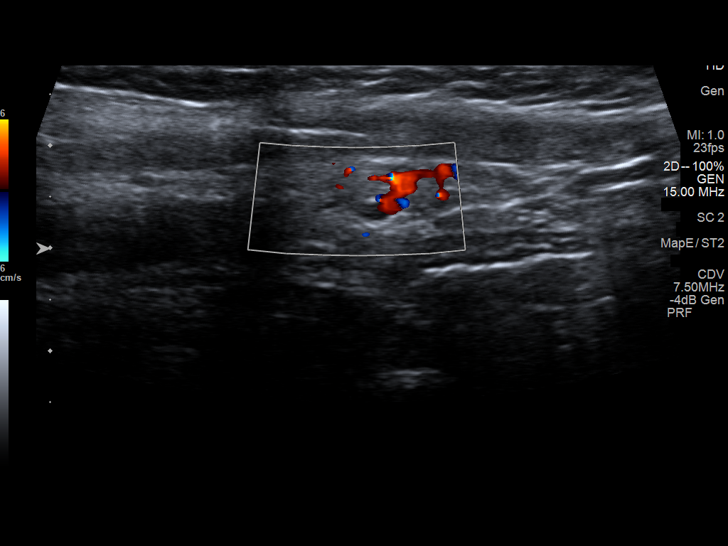
[im 48/48]
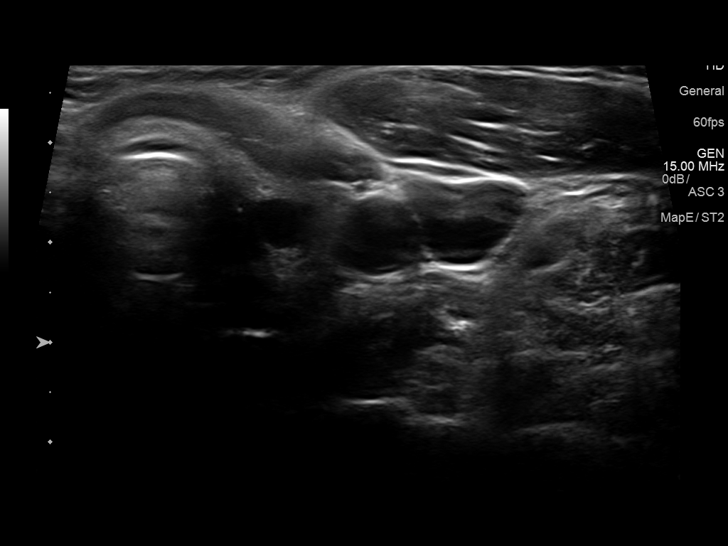

[14 of 25 positions shown; findings below may reference images not displayed]

FINDINGS: Right thyroid lobe

Measurements: 44 x 23 x 22 mm. Dominant 26 x 23 x 21 mm solid nodule
with internal flow signal, inferior pole.

Left thyroid lobe

Measurements: 35 x 10 x 7 mm.  7 x 4 x 5 mm cyst, mid   lobe.

Isthmus

Thickness: 2.2 mm.  No nodules visualized.

Lymphadenopathy

None visualized.
IMPRESSION: 1. Normal-sized thyroid with solitary 2.6 cm right nodule. Findings
meet consensus criteria for biopsy. Ultrasound-guided fine needle
aspiration should be considered, as per the consensus statement:
Management of Thyroid Nodules Detected at US: Society of
Radiologists in Ultrasound Consensus Conference Statement. Radiology

## 2017-06-13 DIAGNOSIS — B372 Candidiasis of skin and nail: Secondary | ICD-10-CM | POA: Diagnosis not present

## 2017-06-13 DIAGNOSIS — G4700 Insomnia, unspecified: Secondary | ICD-10-CM | POA: Diagnosis not present

## 2017-06-13 DIAGNOSIS — F329 Major depressive disorder, single episode, unspecified: Secondary | ICD-10-CM | POA: Diagnosis not present

## 2017-06-13 DIAGNOSIS — E039 Hypothyroidism, unspecified: Secondary | ICD-10-CM | POA: Diagnosis not present

## 2017-06-13 DIAGNOSIS — R634 Abnormal weight loss: Secondary | ICD-10-CM | POA: Diagnosis not present

## 2017-06-13 MED FILL — CITALOPRAM HBR 40 MG TABLET: 40 | 90 days supply | Qty: 90 | Fill #0

## 2017-06-13 MED FILL — SYNTHROID 137 MCG TABLET: 137 | 90 days supply | Qty: 90 | Fill #0

## 2017-07-10 ENCOUNTER — Encounter (HOSPITAL_COMMUNITY): Payer: Self-pay

## 2017-08-15 ENCOUNTER — Encounter: Payer: Self-pay | Admitting: Family Medicine

## 2017-08-15 ENCOUNTER — Ambulatory Visit (INDEPENDENT_AMBULATORY_CARE_PROVIDER_SITE_OTHER): Payer: Self-pay | Admitting: Family Medicine

## 2017-08-15 VITALS — BP 120/90 | HR 66 | Temp 98.8°F | Wt 173.0 lb

## 2017-08-15 DIAGNOSIS — R0981 Nasal congestion: Secondary | ICD-10-CM

## 2017-08-15 DIAGNOSIS — R05 Cough: Secondary | ICD-10-CM

## 2017-08-15 DIAGNOSIS — R059 Cough, unspecified: Secondary | ICD-10-CM

## 2017-08-15 LAB — POCT INFLUENZA A/B
INFLUENZA A, POC: NEGATIVE
Influenza B, POC: NEGATIVE

## 2017-08-15 LAB — POCT RAPID STREP A (OFFICE): Rapid Strep A Screen: NEGATIVE

## 2017-08-15 MED ORDER — PSEUDOEPH-BROMPHEN-DM 30-2-10 MG/5ML PO SYRP: 10.0000 mL | ORAL_SOLUTION | Freq: Three times a day (TID) | ORAL | 0 refills | Status: DC | PRN

## 2017-08-15 MED ORDER — IPRATROPIUM BROMIDE 0.06 % NA SOLN: 2.0000 | Freq: Four times a day (QID) | NASAL | 12 refills | Status: DC

## 2017-08-15 NOTE — Progress Notes (Signed)
Tasha Pearson is 57 y.o. female who presents with 3 days of sore throat and cough and congestion. She confirms positive contact with a flu patient in the last week. She has not attempted to treat this condition with any medications up to this point.  Review of Systems  Constitutional: Positive for chills and malaise/fatigue. Negative for diaphoresis and fever.  HENT: Positive for congestion, ear pain and sore throat.   Eyes: Negative.   Respiratory: Positive for cough. Negative for sputum production.   Cardiovascular: Negative.   Gastrointestinal: Negative.   Genitourinary: Negative.   Musculoskeletal: Negative.   Neurological: Negative.   Endo/Heme/Allergies: Negative.   Psychiatric/Behavioral: Negative.     O: Vitals:   08/15/17 0806  BP: 120/90  Pulse: 66  Temp: 98.8 F (37.1 C)  SpO2: 96%   Physical Exam  Constitutional: She is oriented to person, place, and time. Vital signs are normal. She appears well-developed and well-nourished. She is active.  HENT:  Head: Normocephalic.  Right Ear: Hearing, tympanic membrane, external ear and ear canal normal.  Left Ear: Hearing, tympanic membrane, external ear and ear canal normal.  Nose: Rhinorrhea present.  Mouth/Throat: Uvula is midline, oropharynx is clear and moist and mucous membranes are normal. No oropharyngeal exudate, posterior oropharyngeal edema or posterior oropharyngeal erythema.  Eyes: Pupils are equal, round, and reactive to light.  Neck: Normal range of motion. Neck supple. No JVD present. No tracheal deviation present. No thyromegaly present.  Cardiovascular: Normal rate and regular rhythm.  Pulmonary/Chest: Effort normal and breath sounds normal.  Abdominal: Soft. Bowel sounds are normal.  Musculoskeletal: Normal range of motion.  Lymphadenopathy:       Head (right side): Tonsillar adenopathy present.       Head (left side): Tonsillar adenopathy present.    She has cervical adenopathy.  Neurological: She is  alert and oriented to person, place, and time.  Skin: Skin is warm and dry.  Vitals reviewed.   A: 1. Cough   2. Nasal congestion    P: Discussed diagnosis and etiology no indication of bacterial infection based on exam today. Did obtain and rapid strep and flu on patient due to likelihood of occupational exposure in the last week- both negative- discussed results with patient who verbalized understanding of results and treatment plan and f/u instructions. Medications use and indication discussed and explained. No acute emergent concerns on exam.  1. Cough - brompheniramine-pseudoephedrine-DM 30-2-10 MG/5ML syrup; Take 10 mLs by mouth 3 (three) times daily as needed. - POCT Influenza A/B - POCT rapid strep A  2. Nasal congestion - ipratropium (ATROVENT) 0.06 % nasal spray; Place 2 sprays into both nostrils 4 (four) times daily. - POCT Influenza A/B - POCT rapid strep A Results for orders placed or performed in visit on 08/15/17 (from the past 24 hour(s))  POCT Influenza A/B     Status: None   Collection Time: 08/15/17  8:25 AM  Result Value Ref Range   Influenza A, POC Negative Negative   Influenza B, POC Negative Negative  POCT rapid strep A     Status: None   Collection Time: 08/15/17  8:25 AM  Result Value Ref Range   Rapid Strep A Screen Negative Negative

## 2017-08-15 NOTE — Patient Instructions (Signed)
PLAN< Tylenol for fever, chills and general feeling of unwell Nasal congestion and cough- cough medication Sore throat- Cepacol lozenges and warm salt water rinses Work note- 48 hours   Sore Throat A sore throat is pain, burning, irritation, or scratchiness in the throat. When you have a sore throat, you may feel pain or tenderness in your throat when you swallow or talk. Many things can cause a sore throat, including:  An infection.  Seasonal allergies.  Dryness in the air.  Irritants, such as smoke or pollution.  Gastroesophageal reflux disease (GERD).  A tumor.  A sore throat is often the first sign of another sickness. It may happen with other symptoms, such as coughing, sneezing, fever, and swollen neck glands. Most sore throats go away without medical treatment. Follow these instructions at home:  Take over-the-counter medicines only as told by your health care provider.  Drink enough fluids to keep your urine clear or pale yellow.  Rest as needed.  To help with pain, try: ? Sipping warm liquids, such as broth, herbal tea, or warm water. ? Eating or drinking cold or frozen liquids, such as frozen ice pops. ? Gargling with a salt-water mixture 3-4 times a day or as needed. To make a salt-water mixture, completely dissolve -1 tsp of salt in 1 cup of warm water. ? Sucking on hard candy or throat lozenges. ? Putting a cool-mist humidifier in your bedroom at night to moisten the air. ? Sitting in the bathroom with the door closed for 5-10 minutes while you run hot water in the shower.  Do not use any tobacco products, such as cigarettes, chewing tobacco, and e-cigarettes. If you need help quitting, ask your health care provider. Contact a health care provider if:  You have a fever for more than 2-3 days.  You have symptoms that last (are persistent) for more than 2-3 days.  Your throat does not get better within 7 days.  You have a fever and your symptoms suddenly  get worse. Get help right away if:  You have difficulty breathing.  You cannot swallow fluids, soft foods, or your saliva.  You have increased swelling in your throat or neck.  You have persistent nausea and vomiting. This information is not intended to replace advice given to you by your health care provider. Make sure you discuss any questions you have with your health care provider. Document Released: 05/23/2004 Document Revised: 12/10/2015 Document Reviewed: 02/03/2015 Elsevier Interactive Patient Education  [employer].

## 2017-10-22 ENCOUNTER — Encounter: Payer: Self-pay | Admitting: Family Medicine

## 2017-10-22 ENCOUNTER — Ambulatory Visit (INDEPENDENT_AMBULATORY_CARE_PROVIDER_SITE_OTHER): Payer: Self-pay | Admitting: Family Medicine

## 2017-10-22 VITALS — BP 120/72 | HR 63 | Temp 99.0°F | Ht 64.0 in | Wt 178.6 lb

## 2017-10-22 DIAGNOSIS — Z0000 Encounter for general adult medical examination without abnormal findings: Secondary | ICD-10-CM

## 2017-10-22 NOTE — Progress Notes (Signed)
Other

## 2017-10-22 NOTE — Patient Instructions (Signed)

## 2017-10-22 NOTE — Progress Notes (Signed)
Tasha Pearson is a 57 y.o. female who presents today with concerns of need for a physical exam She reports the chronic condition of hypothyroidism with a history of graves disease and is under the care of a PCP.  Review of Systems  Constitutional: Negative for chills, fever and malaise/fatigue.  HENT: Negative for congestion, ear discharge, ear pain, sinus pain and sore throat.   Eyes: Negative.   Respiratory: Negative for cough, sputum production and shortness of breath.   Cardiovascular: Negative.  Negative for chest pain.  Gastrointestinal: Negative for abdominal pain, diarrhea, nausea and vomiting.  Genitourinary: Negative for dysuria, frequency, hematuria and urgency.  Musculoskeletal: Negative for myalgias.  Skin: Negative.   Neurological: Negative for headaches.  Endo/Heme/Allergies: Negative.   Psychiatric/Behavioral: Negative.     O: Vitals:   10/22/17 1755  BP: 120/72  Pulse: 63  Temp: 99 F (37.2 C)  SpO2: 98%     Physical Exam  Constitutional: She is oriented to person, place, and time. Vital signs are normal. She appears well-developed and well-nourished. She is active.  Non-toxic appearance. She does not have a sickly appearance.  HENT:  Head: Normocephalic.  Right Ear: Hearing, tympanic membrane, external ear and ear canal normal.  Left Ear: Hearing, tympanic membrane, external ear and ear canal normal.  Nose: Nose normal.  Mouth/Throat: Uvula is midline and oropharynx is clear and moist.  Neck: Normal range of motion. Neck supple.  Cardiovascular: Normal rate, regular rhythm, normal heart sounds and normal pulses.  Pulmonary/Chest: Effort normal and breath sounds normal.  Abdominal: Soft. Bowel sounds are normal.  Musculoskeletal: Normal range of motion.  Lymphadenopathy:       Head (right side): No submental and no submandibular adenopathy present.       Head (left side): No submental and no submandibular adenopathy present.    She has no cervical  adenopathy.  Neurological: She is alert and oriented to person, place, and time.  Psychiatric: She has a normal mood and affect.  Vitals reviewed.  A: 1. Physical exam    P: Exam findings, diagnosis etiology and medication use and indications reviewed with patient. Follow- Up and discharge instructions provided. No emergent/urgent issues found on exam.  Patient verbalized understanding of information provided and agrees with plan of care (POC), all questions answered.  1. Physical exam WNL- completed

## 2017-11-19 MED FILL — SYNTHROID 137 MCG TABLET: 137 | 90 days supply | Qty: 90 | Fill #1

## 2017-11-27 DIAGNOSIS — H524 Presbyopia: Secondary | ICD-10-CM | POA: Diagnosis not present

## 2018-03-05 ENCOUNTER — Ambulatory Visit (INDEPENDENT_AMBULATORY_CARE_PROVIDER_SITE_OTHER): Payer: 59

## 2018-03-05 ENCOUNTER — Ambulatory Visit (INDEPENDENT_AMBULATORY_CARE_PROVIDER_SITE_OTHER): Payer: 59 | Admitting: Cardiovascular Disease

## 2018-03-05 ENCOUNTER — Encounter: Payer: Self-pay | Admitting: Cardiovascular Disease

## 2018-03-05 VITALS — BP 146/84 | HR 78 | Ht 64.0 in | Wt 183.8 lb

## 2018-03-05 DIAGNOSIS — I493 Ventricular premature depolarization: Secondary | ICD-10-CM | POA: Diagnosis not present

## 2018-03-05 NOTE — Patient Instructions (Addendum)
Medication Instructions:  No changes If you need a refill on your cardiac medications before your next appointment, please call your pharmacy.   Lab work: Your provider would like for you to have the following labs today: CBC, BMET, Magnesium, and TSH  If you have labs (blood work) drawn today and your tests are completely normal, you will receive your results only by: Marland Kitchen MyChart Message (if you have MyChart) OR . A paper copy in the mail If you have any lab test that is abnormal or we need to change your treatment, we will call you to review the results.  Testing/Procedures: Your physician has recommended that you wear a 3 day Zio event monitor. Event monitors are medical devices that record the heart's electrical activity. Doctors most often [place] these monitors to diagnose arrhythmias. Arrhythmias are problems with the speed or rhythm of the heartbeat. The monitor is a small, portable device. You can wear one while you do your normal daily activities. This is usually used to diagnose what is causing palpitations/syncope (passing out).  Your physician has requested that you have an echocardiogram. Echocardiography is a painless test that uses sound waves to create images of your heart. It provides your doctor with information about the size and shape of your heart and how well your heart's chambers and valves are working. You may receive an ultrasound enhancing agent through an IV if needed to better visualize your heart during the echo.This procedure takes approximately one hour. There are no restrictions for this procedure. This will take place at the [facility].    Follow-Up: At [facility], you and your health needs are our priority.  As part of our continuing mission to provide you with exceptional heart care, we have created designated Provider Care Teams.  These Care Teams include your primary Cardiologist (physician) and Advanced Practice Providers (APPs -  Physician  Assistants and Nurse Practitioners) who all work together to provide you with the care you need, when you need it. You will need a follow up appointment in 2 months. You may see Dr. Fletcher Anon or one of the following Advanced Practice Providers on your designated Care Team:   Murray Hodgkins, NP Christell Faith, PA-C . Marrianne Mood, PA-C

## 2018-03-05 NOTE — Progress Notes (Signed)
Cardiology Office Note   Date:  03/05/2018   ID:  Tasha Pearson, DOB 1960/09/19, MRN [id number]  PCP:  Jonathon Resides, MD  Cardiologist:   Kathlyn Sacramento, MD   Chief Complaint  Patient presents with  . other    Palpatations. Medications reviewed verbally.       History of Present Illness: Tasha Pearson is a 57 y.o. female who is self-referred for evaluation of palpitations due to suspected PVCs.  She has no prior cardiac history.  She was evaluated by me more than 10 years ago for atypical chest pain.  She had a negative stress test at that time.  She had weight loss surgery in 2017 was significant drop in weight since then. Over the last week, she started having palpitations almost happening on a daily basis.  These are frequent enough that she feels very uncomfortable with them.  No chest pain or shortness of breath.  No dizziness, presyncope or syncope.  The palpitations are happening at rest and with exertion.  She does have known history of hypothyroidism on replacement with no recent testing.  She is not a smoker.  Family history is remarkable for coronary artery disease.  Her father had coronary artery disease with CABG and ischemic cardiomyopathy in his 62s. No excessive caffeine intake.    Past Medical History:  Diagnosis Date  . Arthritis    feet  . Depression   . GERD (gastroesophageal reflux disease)   . H/O Graves' disease   . Headache(784.0)    rare  . Hiatal hernia   . Hypercholesteremia   . Hypothyroidism   . IBS (irritable bowel syndrome)    Pt reported on 01/18/13  . Morbid obesity ([place])   . Pneumonia 20's   hx of  . PONV (postoperative nausea and vomiting)    severe vomiting  . Schwannoma    left ear    Past Surgical History:  Procedure Laterality Date  . ABDOMINAL HYSTERECTOMY     1 ovary left  . BIOPSY THYROID    . CESAREAN SECTION     x2  . CHOLECYSTECTOMY    . GASTRIC ROUX-EN-Y N/A 12/11/2015   Procedure: LAPAROSCOPIC ROUX-EN-Y GASTRIC  BYPASS HIATEL HERNIA REPAIR WITH UPPER ENDOSCOPY;  Surgeon: Excell Seltzer, MD;  Location: [facility];  Service: General;  Laterality: N/A;  . LAPAROSCOPIC GASTRIC SLEEVE RESECTION N/A 04/26/2013   Procedure: LAPAROSCOPIC GASTRIC SLEEVE RESECTION;  Surgeon: Edward Jolly, MD;  Location: [facility];  Service: General;  Laterality: N/A;  . SPINE SURGERY     fusion, nerve release, and replaced a disc in lower back, l5-s1  . UPPER GI ENDOSCOPY N/A 04/26/2013   Procedure: UPPER GI ENDOSCOPY;  Surgeon: Edward Jolly, MD;  Location: [facility];  Service: General;  Laterality: N/A;     Current Outpatient Medications  Medication Sig Dispense Refill  . acetaminophen (TYLENOL) 500 MG tablet Take 500 mg by mouth every 6 (six) hours as needed for pain.    Marland Kitchen levothyroxine (SYNTHROID, LEVOTHROID) 150 MCG tablet Take 150 mcg by mouth daily before breakfast.     No current facility-administered medications for this visit.     Allergies:   Penicillins    Social History:  The patient  reports that she quit smoking about 9 years ago. Her smoking use included cigarettes. She quit after 10.00 years of use. She has never used smokeless tobacco. She reports that she drinks alcohol. She reports that she does not use  drugs.   Family History:  The patient's family history includes Diabetes in her brother; Heart disease in her father; Pancreatic cancer in her mother.    ROS:  Please see the history of present illness.   Otherwise, review of systems are positive for none.   All other systems are reviewed and negative.    PHYSICAL EXAM: VS:  BP (!) 146/84 (BP Location: Right Arm, Patient Position: Sitting, Cuff Size: Normal)   Pulse 78   Ht 5\' 4"  (1.626 m)   Wt 183 lb 12 oz (83.3 kg)   BMI 31.54 kg/m  , BMI Body mass index is 31.54 kg/m. GEN: Well nourished, well developed, in no acute distress  HEENT: normal  Neck: no JVD, carotid bruits, or masses Cardiac: RRR; no murmurs, rubs, or gallops,no edema    Respiratory:  clear to auscultation bilaterally, normal work of breathing GI: soft, nontender, nondistended, + BS MS: no deformity or atrophy  Skin: warm and dry, no rash Neuro:  Strength and sensation are intact Psych: euthymic mood, full affect   EKG:  EKG is ordered today. The ekg ordered today demonstrates normal sinus rhythm with occasional PVCs.   Recent Labs: No results found for requested labs within last 8760 hours.    Lipid Panel    Component Value Date/Time   CHOL 245 (H) 09/13/2013 0937   TRIG 50 09/13/2013 0937   HDL 60 09/13/2013 0937   CHOLHDL 4.1 09/13/2013 0937   VLDL 10 09/13/2013 0937   LDLCALC 175 (H) 09/13/2013 0937      Wt Readings from Last 3 Encounters:  03/05/18 183 lb 12 oz (83.3 kg)  10/22/17 178 lb 9.6 oz (81 kg)  08/15/17 173 lb (78.5 kg)       PAD Screen 03/05/2018  Previous PAD dx? No  Previous surgical procedure? No  Pain with walking? No  Feet/toe relief with dangling? No  Painful, non-healing ulcers? No  Extremities discolored? No      ASSESSMENT AND PLAN:  1.  Symptomatic PVCs: Her palpitations seem to be related to this as evident on her EKG.  I think we have to evaluate for possible underlying metabolic abnormalities causing this.  I requested routine labs today including electrolytes, magnesium and TSH.  In addition, will check CBC. I requested a 3-day outpatient monitor to quantify her PVC burden.  I also requested an echocardiogram to ensure no structural heart abnormalities or cardiomyopathy.   2.  Hypothyroidism: On replacement.  Check TSH given palpitations.   Disposition:   FU with me in 2 months  Signed,  Kathlyn Sacramento, MD  03/05/2018 3:06 PM    [facility]

## 2018-03-06 LAB — BASIC METABOLIC PANEL
BUN / CREAT RATIO: 16 (ref 9–23)
BUN: 15 mg/dL (ref 6–24)
CO2: 25 mmol/L (ref 20–29)
Calcium: 9.4 mg/dL (ref 8.7–10.2)
Chloride: 104 mmol/L (ref 96–106)
Creatinine, Ser: 0.94 mg/dL (ref 0.57–1.00)
GFR, EST AFRICAN AMERICAN: 78 mL/min/{1.73_m2} (ref >59)
GFR, EST NON AFRICAN AMERICAN: 68 mL/min/{1.73_m2} (ref >59)
Glucose: 105 mg/dL — ABNORMAL HIGH (ref 65–99)
Potassium: 4 mmol/L (ref 3.5–5.2)
Sodium: 142 mmol/L (ref 134–144)

## 2018-03-06 LAB — CBC
Hematocrit: 39.7 % (ref 34.0–46.6)
Hemoglobin: 13.1 g/dL (ref 11.1–15.9)
MCH: 28.9 pg (ref 26.6–33.0)
MCHC: 33 g/dL (ref 31.5–35.7)
MCV: 88 fL (ref 79–97)
Platelets: 265 10*3/uL (ref 150–450)
RBC: 4.53 x10E6/uL (ref 3.77–5.28)
RDW: 12.6 % (ref 12.3–15.4)
WBC: 5.3 10*3/uL (ref 3.4–10.8)

## 2018-03-06 LAB — TSH: TSH: 0.721 u[IU]/mL (ref 0.450–4.500)

## 2018-03-06 LAB — MAGNESIUM: Magnesium: 1.9 mg/dL (ref 1.6–2.3)

## 2018-03-10 ENCOUNTER — Telehealth: Payer: Self-pay | Admitting: Cardiovascular Disease

## 2018-03-10 NOTE — Telephone Encounter (Signed)
No answer. Left message to call back.   

## 2018-03-10 NOTE — Telephone Encounter (Signed)
°  1. Is this related to a heart monitor you are wearing?  Recently removed  2. What is your issue?? Patient returning call we lmov to ask about returning zio patient says she mailed back yesterday please only call and lm on cell not daughters phone   Please route to covering RN/CMA/RMA for results. Route to monitor technicians or your monitor tech representative for your site for any technical concerns

## 2018-03-12 ENCOUNTER — Ambulatory Visit (INDEPENDENT_AMBULATORY_CARE_PROVIDER_SITE_OTHER): Payer: 59

## 2018-03-12 DIAGNOSIS — I493 Ventricular premature depolarization: Secondary | ICD-10-CM

## 2018-03-12 MED FILL — SYNTHROID 137 MCG TABLET: 137 | 90 days supply | Qty: 90 | Fill #2

## 2018-03-13 NOTE — Telephone Encounter (Signed)
Patient called and made aware that the monitor has been received from [facility] and there was no record of why anyone had called her. She verbalized her understanding.

## 2018-03-23 DIAGNOSIS — S82001A Unspecified fracture of right patella, initial encounter for closed fracture: Secondary | ICD-10-CM | POA: Diagnosis not present

## 2018-04-06 DIAGNOSIS — S82001D Unspecified fracture of right patella, subsequent encounter for closed fracture with routine healing: Secondary | ICD-10-CM | POA: Diagnosis not present

## 2018-05-14 ENCOUNTER — Encounter: Payer: Self-pay | Admitting: Nurse Practitioner

## 2018-05-14 ENCOUNTER — Ambulatory Visit: Payer: 59 | Admitting: Nurse Practitioner

## 2018-05-14 VITALS — BP 152/90 | HR 75 | Ht 64.0 in | Wt 182.0 lb

## 2018-05-14 DIAGNOSIS — J0100 Acute maxillary sinusitis, unspecified: Secondary | ICD-10-CM

## 2018-05-14 DIAGNOSIS — R002 Palpitations: Secondary | ICD-10-CM

## 2018-05-14 DIAGNOSIS — R030 Elevated blood-pressure reading, without diagnosis of hypertension: Secondary | ICD-10-CM | POA: Diagnosis not present

## 2018-05-14 DIAGNOSIS — I493 Ventricular premature depolarization: Secondary | ICD-10-CM

## 2018-05-14 MED ORDER — DOXYCYCLINE HYCLATE 100 MG PO CAPS: 100.0000 mg | ORAL_CAPSULE | Freq: Two times a day (BID) | ORAL | 0 refills | Status: AC

## 2018-05-14 MED FILL — DOXYCYCLINE HYC 100 MG CAPS: 100 | 7 days supply | Qty: 14 | Fill #0

## 2018-05-14 NOTE — Progress Notes (Signed)
Office Visit    Patient Name: Tasha Pearson Date of Encounter: 05/14/2018  Primary Care Provider:  Jonathon Resides, MD Primary Cardiologist:  Kathlyn Sacramento, MD  Chief Complaint    58 year old female with a history of palpitations/symptomatic PVCs and hypothyroidism, who presents for follow-up related to palpitations.  Past Medical History    Past Medical History:  Diagnosis Date  . Arthritis    feet  . Chest pain    a. remote h/o nl stress test.  . Depression   . GERD (gastroesophageal reflux disease)   . H/O Graves' disease   . Headache(784.0)    rare  . Hiatal hernia   . Hypercholesteremia   . Hypothyroidism   . IBS (irritable bowel syndrome)    Pt reported on 01/18/13  . Morbid obesity ([place])   . Palpitations   . Pneumonia 20's   hx of  . PONV (postoperative nausea and vomiting)    severe vomiting  . Schwannoma    left ear  . Symptomatic PVCs    a. 02/2018 Zio: Avg HR 74. 2 short episodes of SVT - longest 4 beats @ 158. Rare PACs and PVCs (<1% burden). Some triggered events correlated w/ isolated PVCs; b. 02/2018 Echo: EF 55-60%, no rwma.   Past Surgical History:  Procedure Laterality Date  . ABDOMINAL HYSTERECTOMY     1 ovary left  . BIOPSY THYROID    . CESAREAN SECTION     x2  . CHOLECYSTECTOMY    . GASTRIC ROUX-EN-Y N/A 12/11/2015   Procedure: LAPAROSCOPIC ROUX-EN-Y GASTRIC BYPASS HIATEL HERNIA REPAIR WITH UPPER ENDOSCOPY;  Surgeon: Excell Seltzer, MD;  Location: [facility];  Service: General;  Laterality: N/A;  . LAPAROSCOPIC GASTRIC SLEEVE RESECTION N/A 04/26/2013   Procedure: LAPAROSCOPIC GASTRIC SLEEVE RESECTION;  Surgeon: Edward Jolly, MD;  Location: [facility];  Service: General;  Laterality: N/A;  . SPINE SURGERY     fusion, nerve release, and replaced a disc in lower back, l5-s1  . UPPER GI ENDOSCOPY N/A 04/26/2013   Procedure: UPPER GI ENDOSCOPY;  Surgeon: Edward Jolly, MD;  Location: [facility];  Service: General;  Laterality: N/A;     Allergies  Allergies  Allergen Reactions  . Penicillins Rash    Has patient had a PCN reaction causing immediate rash, facial/tongue/throat swelling, SOB or lightheadedness with hypotension:YES Has patient had a PCN reaction causing severe rash involving mucus membranes or skin necrosis: NO Has patient had a PCN reaction that required hospitalizationNO Has patient had a PCN reaction occurring within the last 10 years: NO If all of the above answers are "NO", then may proceed with Cephalosporin use.    History of Present Illness    58 year old female with the above past medical history including a prior history of chest pain with remote normal stress testing.  She also has a history of hypothyroidism and more recently was evaluated by Dr. Fletcher Anon in November 2019 secondary to palpitations and symptomatic PVCs.  Event monitoring confirmed triggered events associated with isolated PVCs.  PVC burden was less than 1%.  She also had 2 short runs of SVT (4 beats).  Echocardiography showed normal LV function.  Conservative therapy was recommended.  Shortly after wearing the event monitor, she went to [facility] and says that she was walking quite a bit on one particular day after having had some cocktails the evening before.  She thinks she was probably dehydrated but did have more frequent PVCs and couplets.  Since then however, her PVCs have been more or less quiescent and overall tolerable.  Over the past week, she has had significant sinus congestion, pressure, green/yellow nasal drainage and also productive cough.  She used Afrin once but is avoiding over-the-counter cold remedies.  She denies chest pain, dyspnea, or edema.  Home Medications    Prior to Admission medications   Medication Sig Start Date End Date Taking? Authorizing Provider  acetaminophen (TYLENOL) 500 MG tablet Take 500 mg by mouth every 6 (six) hours as needed for pain.    [provider]  levothyroxine (SYNTHROID,  LEVOTHROID) 150 MCG tablet Take 150 mcg by mouth daily before breakfast.    [provider]    Review of Systems    Occasional palpitations which overall have improved.  Sinus congestion with productive cough..  All other systems reviewed and are otherwise negative except as noted above.  Physical Exam    VS:  BP (!) 152/90 (BP Location: Left Arm, Patient Position: Sitting, Cuff Size: Normal)   Pulse 75   Ht 5\' 4"  (1.626 m)   Wt 182 lb (82.6 kg)   BMI 31.24 kg/m  , BMI Body mass index is 31.24 kg/m. GEN: Well nourished, well developed, in no acute distress. HEENT: normal.  Maxillary and frontal sinus pressure/tenderness upon palpation. Neck: Supple, no JVD, carotid bruits, or masses.  No lymphadenopathy. Cardiac: RRR, no murmurs, rubs, or gallops. No clubbing, cyanosis, edema.  Radials/DP/PT 2+ and equal bilaterally.  Respiratory:  Respirations regular and unlabored, clear to auscultation bilaterally. GI: Soft, nontender, nondistended, BS + x 4. MS: no deformity or atrophy. Skin: warm and dry, no rash. Neuro:  Strength and sensation are intact. Psych: Normal affect.  Accessory Clinical Findings    ECG personally reviewed by me today -regular sinus rhythm, 75- no acute changes.  Lab Results  Component Value Date   CREATININE 0.94 03/05/2018   BUN 15 03/05/2018   NA 142 03/05/2018   K 4.0 03/05/2018   CL 104 03/05/2018   CO2 25 03/05/2018   Lab Results  Component Value Date   TSH 0.721 03/05/2018     Assessment & Plan    1.  Symptomatic PVCs/palpitations: Relatively stable over the past month.  She did have increased burden of PVCs while walking in [place], though she suspects that she was dehydrated at that time.  She is not currently interested in beta-blocker therapy and would prefer to see how things go.  2.  Elevated blood pressure: Blood pressure is elevated today at 152/90.  She does not routinely check her blood pressure at home but does have  access to a blood pressure cuff at work.  She is going to check her pressure daily and contact us if systolics are consistently greater than 130, at which point it would be reasonable to add beta-blocker therapy, especially in the setting of above.  3.  Acute sinusitis: Over the past 3 to 5 days, patient has been experiencing significant sinus pressure with green/yellow nasal discharge and productive cough.  Lungs are clear.  She does have sinus tenderness on palpation.  I will add doxycycline 1 mg twice daily x7 days.  4.  Hypothyroidism: On Synthroid.  TSH normal on March 05, 2018.  5.  Disposition: Follow-up in 4 to 6 months.  She will contact us within the next 2 weeks with blood pressure trends, at which point we may consider initiation of beta-blocker therapy.   Murray Hodgkins, NP 05/14/2018, 8:41  AM

## 2018-05-14 NOTE — Patient Instructions (Addendum)
Medication Instructions:  Your physician has recommended you make the following change in your medication:  1. START Doxycycline 100 mg twice a day for 7 days  Monitor blood pressures for a week and call us with those readings. [phone]  If you need a refill on your cardiac medications before your next appointment, please call your pharmacy.   Lab work: None If you have labs (blood work) drawn today and your tests are completely normal, you will receive your results only by: Marland Kitchen MyChart Message (if you have MyChart) OR . A paper copy in the mail If you have any lab test that is abnormal or we need to change your treatment, we will call you to review the results.  Testing/Procedures: None  Follow-Up: At [facility], you and your health needs are our priority.  As part of our continuing mission to provide you with exceptional heart care, we have created designated Provider Care Teams.  These Care Teams include your primary Cardiologist (physician) and Advanced Practice Providers (APPs -  Physician Assistants and Nurse Practitioners) who all work together to provide you with the care you need, when you need it. You will need a follow up appointment in 6 months.  Please call our office 2 months in advance to schedule this appointment.  You may see Kathlyn Sacramento, MD or one of the following Advanced Practice Providers on your designated Care Team:   Murray Hodgkins, NP Christell Faith, PA-C . Marrianne Mood, PA-C  Any Other Special Instructions Will Be Listed Below (If Applicable).

## 2018-06-02 IMAGING — US US THYROID
1 series · 13 of 25 positions shown · non-contrast
Comparison: 06/13/2015; ultrasound-guided right sided thyroid
nodule fine-needle aspiration -06/20/2015

CLINICAL DATA: Prior ultrasound follow-up. History of prior right
lower pole thyroid nodule biopsy

EXAM:
THYROID ULTRASOUND
TECHNIQUE: Ultrasound examination of the thyroid gland and adjacent soft
tissues was performed.

[Series 1: us thyroid · 0.03mm/px · 13 of 49 slices shown]
[im 1/49]
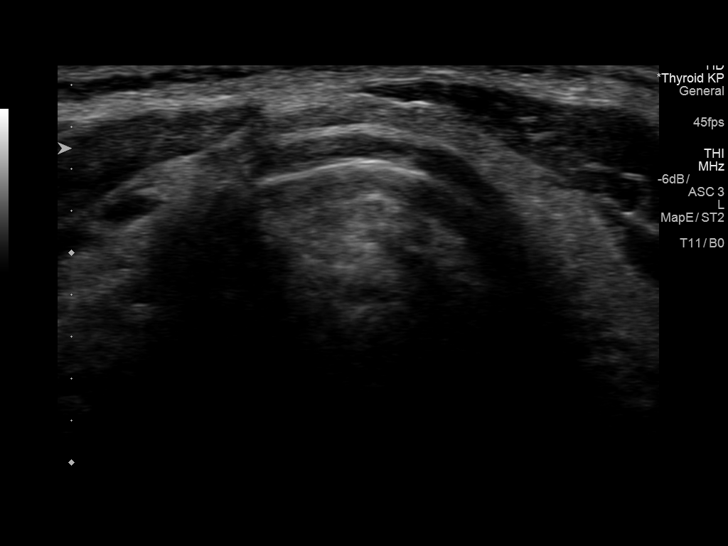
[im 5/49]
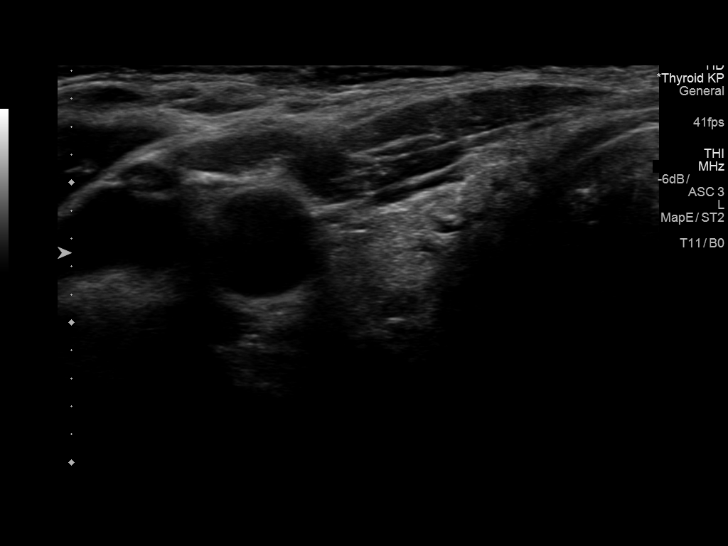
[im 9/49]
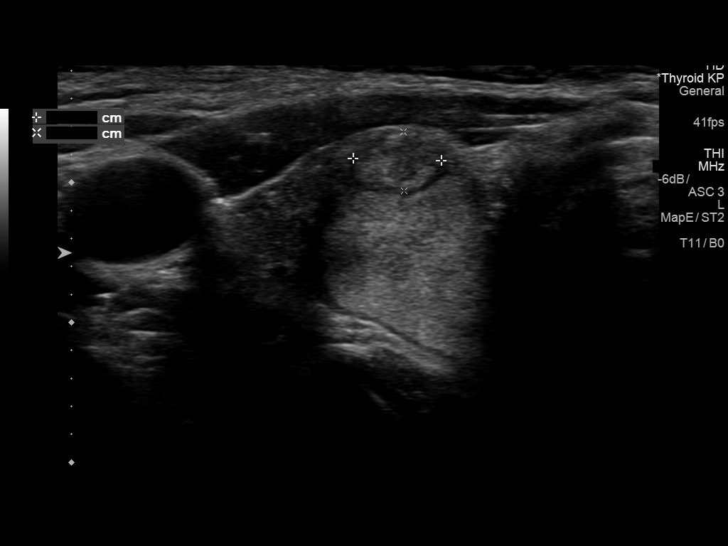
[im 13/49]
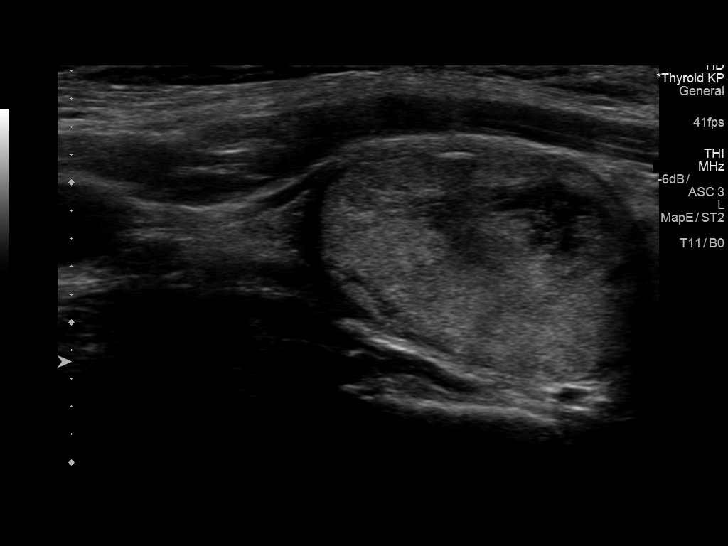
[im 17/49]
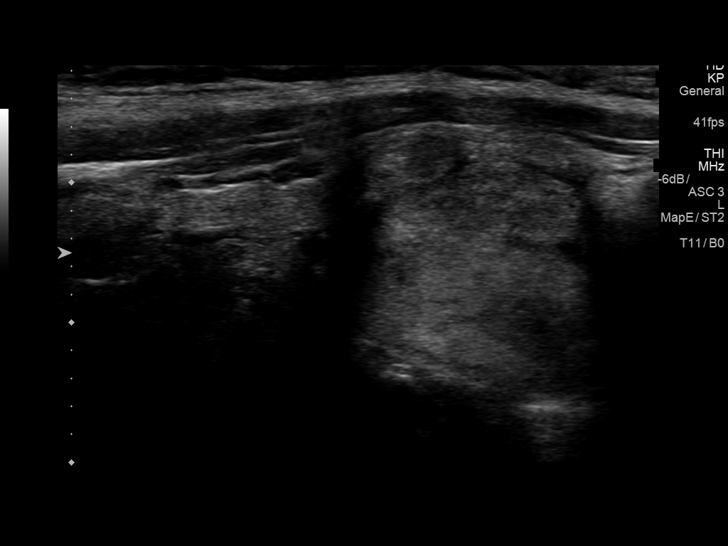
[im 21/49]
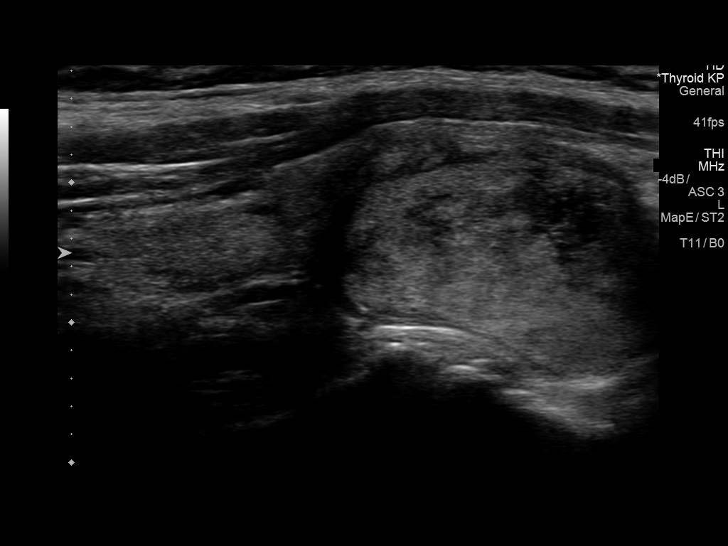
[im 25/49]
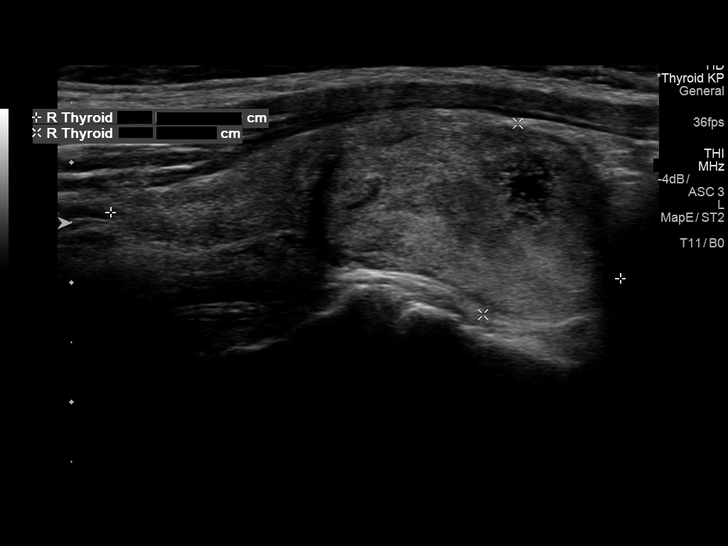
[im 29/49]
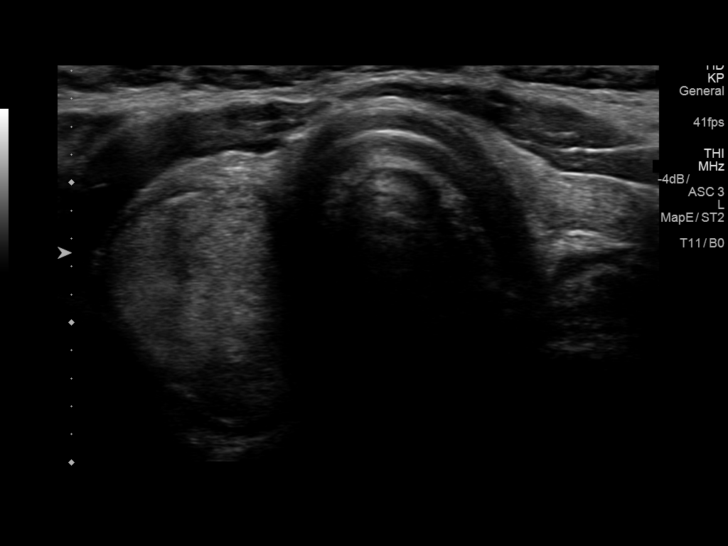
[im 33/49]
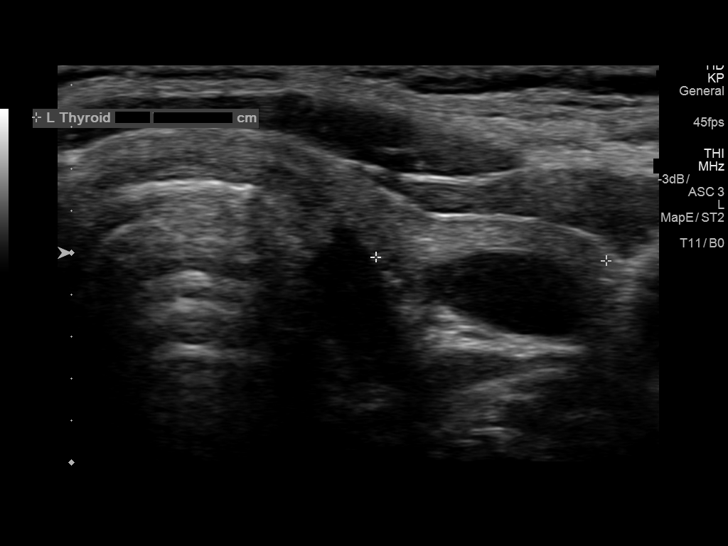
[im 37/49]
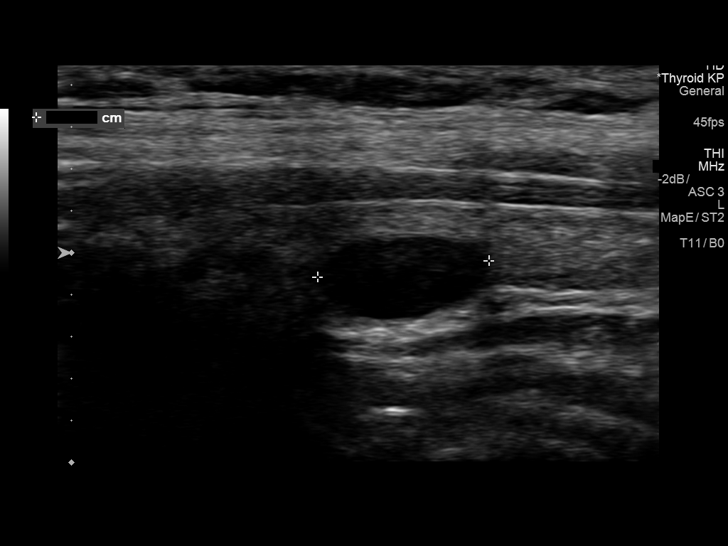
[im 41/49]
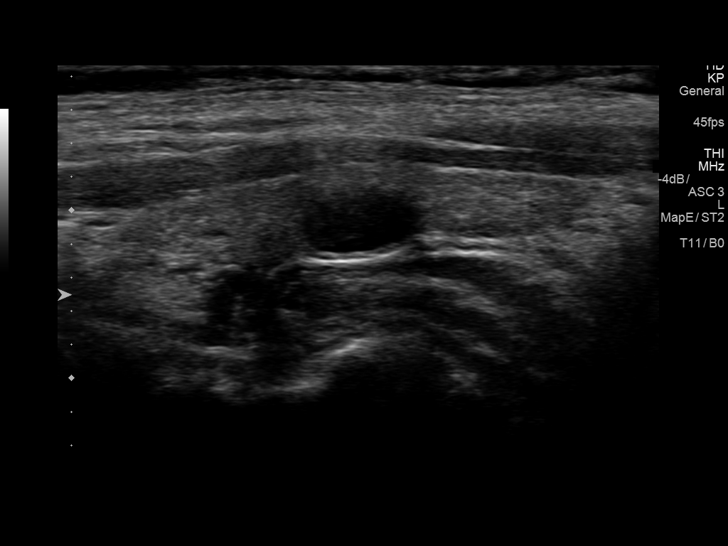
[im 45/49]
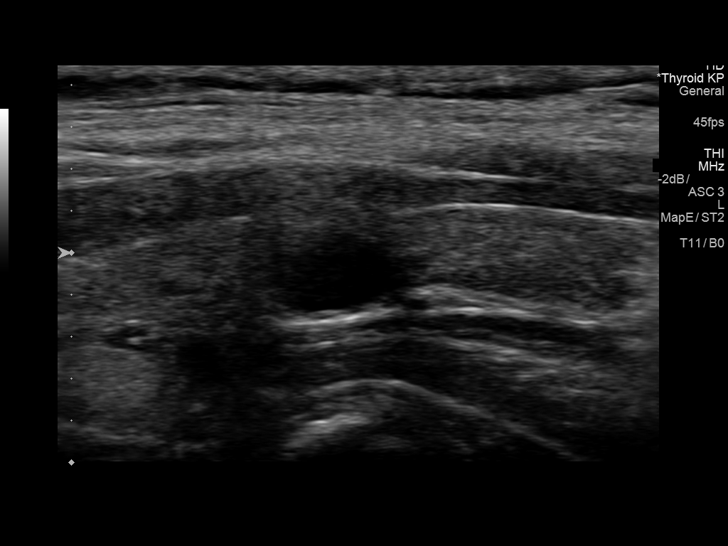
[im 49/49]
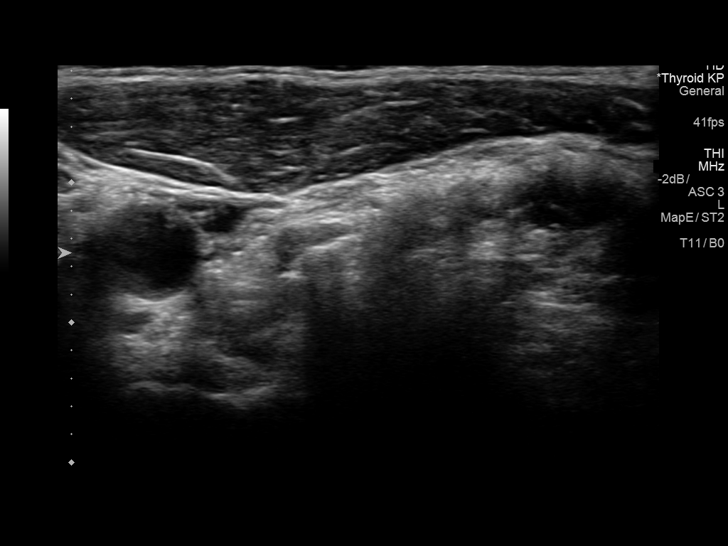

[13 of 25 positions shown; findings below may reference images not displayed]

FINDINGS: Parenchymal Echotexture: Mildly heterogenous

Isthmus: Normal in size measures 0.2 cm in diameter, unchanged

Right lobe: Normal in size measuring 4.3 x 1.6 x 1.9 cm, unchanged,
previously, 4.4 x 2.3 x 2.2 cm

Left lobe: Diminutive in size measuring 2.8 x 0.6 x 1.1 cm,
unchanged, previously, 3.5 x 1.0 x 0.7 cm with slight differences
likely total to scan plane projection

_________________________________________________________

Estimated total number of nodules >/= 1 cm: 1

Number of spongiform nodules >/=  2 cm not described below (TR1): 0

Number of mixed cystic and solid nodules >/= 1.5 cm not described
below (TR2): 0

The previously biopsied approximately 2.5 x 1.8 x 1.5 cm nodule
within in the mid/inferior aspect of the right lobe of the thyroid
is unchanged to minimally decreased in size in interval, previously,
2.6 x 2.3 x 2.1 cm. Correlation with prior biopsy results is
recommended.

No change to slight enlargement of the approximately 0.8 x 0.4 x
cm cyst within mid aspect the left lobe of the thyroid, previously,
0.7 x 0.4 x 0.5 cm. This nodule/cyst again does not meet imaging
criteria to recommend percutaneous sampling or dedicated follow-up.

Nodule # 2:

Location: Right; Mid - this nodule was not definitely seen on the
prior examination.

Maximum size: 0.7 cm; Other 2 dimensions: 0.4 x 0.6 cm

Composition: solid/almost completely solid (2)

Echogenicity: isoechoic (1)

Shape: not taller-than-wide (0)

Margins: smooth (0)

Echogenic foci: none (0)

ACR TI-RADS total points: 3.

ACR TI-RADS risk category: TR3 (3 points).

ACR TI-RADS recommendations:

Given size (<1.4 cm) and appearance, this nodule does NOT meet
TI-RADS criteria for biopsy or dedicated follow-up.
IMPRESSION: 1. Similar findings of multinodular goiter.
2. No change to slight decrease in size of the previously biopsied
now approximately 2.5 cm nodule within the inferior aspect the right
lobe of the thyroid. Correlation with prior biopsy results is
recommended. Assuming a benign pathologic diagnosis, repeat sampling
and/or dedicated follow-up is not recommended.
3. None of the remaining thyroid nodules meet imaging criteria to
recommend percutaneous sampling or dedicated follow-up.
The above is in keeping with the ACR TI-RADS recommendations - [HOSPITAL] 4831;[DATE].

## 2018-06-19 MED FILL — SYNTHROID 137 MCG TABLET: 137 | 30 days supply | Qty: 30 | Fill #0

## 2018-08-04 DIAGNOSIS — E039 Hypothyroidism, unspecified: Secondary | ICD-10-CM | POA: Diagnosis not present

## 2018-08-04 DIAGNOSIS — F329 Major depressive disorder, single episode, unspecified: Secondary | ICD-10-CM | POA: Diagnosis not present

## 2018-08-04 MED FILL — SYNTHROID 137 MCG TABLET: 137 | 90 days supply | Qty: 90 | Fill #0

## 2018-08-04 MED FILL — FLUoxetine HCL 20 MG CAPS: 20 | 90 days supply | Qty: 90 | Fill #0

## 2018-11-24 ENCOUNTER — Other Ambulatory Visit: Payer: Self-pay | Admitting: Physician Assistant

## 2018-11-24 DIAGNOSIS — H9042 Sensorineural hearing loss, unilateral, left ear, with unrestricted hearing on the contralateral side: Secondary | ICD-10-CM

## 2018-11-24 DIAGNOSIS — H903 Sensorineural hearing loss, bilateral: Secondary | ICD-10-CM | POA: Diagnosis not present

## 2018-11-24 DIAGNOSIS — H90A22 Sensorineural hearing loss, unilateral, left ear, with restricted hearing on the contralateral side: Secondary | ICD-10-CM | POA: Diagnosis not present

## 2018-11-24 DIAGNOSIS — IMO0001 Reserved for inherently not codable concepts without codable children: Secondary | ICD-10-CM

## 2018-11-24 DIAGNOSIS — D333 Benign neoplasm of cranial nerves: Secondary | ICD-10-CM | POA: Diagnosis not present

## 2018-12-03 MED FILL — FLUoxetine HCL 20 MG CAPS: 20 | 90 days supply | Qty: 90 | Fill #0

## 2018-12-03 MED FILL — SYNTHROID 137 MCG TABLET: 137 | 90 days supply | Qty: 90 | Fill #0

## 2018-12-10 ENCOUNTER — Ambulatory Visit
Admission: RE | Admit: 2018-12-10 | Discharge: 2018-12-10 | Disposition: A | Discharge status: Home / Self Care | Payer: 59 | Source: Ambulatory Visit | Attending: Physician Assistant | Admitting: Physician Assistant

## 2018-12-10 ENCOUNTER — Other Ambulatory Visit: Payer: Self-pay

## 2018-12-10 DIAGNOSIS — H938X2 Other specified disorders of left ear: Secondary | ICD-10-CM | POA: Diagnosis not present

## 2018-12-10 DIAGNOSIS — H9042 Sensorineural hearing loss, unilateral, left ear, with unrestricted hearing on the contralateral side: Secondary | ICD-10-CM | POA: Insufficient documentation

## 2018-12-10 DIAGNOSIS — IMO0001 Reserved for inherently not codable concepts without codable children: Secondary | ICD-10-CM

## 2018-12-10 MED ORDER — GADOBUTROL 1 MMOL/ML IV SOLN
10.0000 mL | Freq: Once | INTRAVENOUS | Status: AC | PRN
  Administered 2018-12-10: 10 mL via INTRAVENOUS

## 2018-12-21 DIAGNOSIS — D333 Benign neoplasm of cranial nerves: Secondary | ICD-10-CM | POA: Diagnosis not present

## 2019-01-22 ENCOUNTER — Other Ambulatory Visit: Payer: Self-pay

## 2019-01-22 DIAGNOSIS — R6889 Other general symptoms and signs: Secondary | ICD-10-CM | POA: Diagnosis not present

## 2019-01-22 DIAGNOSIS — Z20822 Contact with and (suspected) exposure to covid-19: Secondary | ICD-10-CM

## 2019-01-23 LAB — NOVEL CORONAVIRUS, NAA: SARS-CoV-2, NAA: NOT DETECTED

## 2019-01-25 DIAGNOSIS — H903 Sensorineural hearing loss, bilateral: Secondary | ICD-10-CM | POA: Diagnosis not present

## 2019-01-25 DIAGNOSIS — D333 Benign neoplasm of cranial nerves: Secondary | ICD-10-CM | POA: Diagnosis not present

## 2019-02-26 DIAGNOSIS — H524 Presbyopia: Secondary | ICD-10-CM | POA: Diagnosis not present

## 2019-03-01 DIAGNOSIS — D333 Benign neoplasm of cranial nerves: Secondary | ICD-10-CM | POA: Diagnosis not present

## 2019-03-01 DIAGNOSIS — H903 Sensorineural hearing loss, bilateral: Secondary | ICD-10-CM | POA: Diagnosis not present

## 2019-03-09 DIAGNOSIS — H903 Sensorineural hearing loss, bilateral: Secondary | ICD-10-CM | POA: Diagnosis not present

## 2019-03-09 MED FILL — SYNTHROID 137 MCG TABLET: 137 | 90 days supply | Qty: 90 | Fill #1

## 2019-03-31 DIAGNOSIS — H53483 Generalized contraction of visual field, bilateral: Secondary | ICD-10-CM | POA: Diagnosis not present

## 2019-03-31 DIAGNOSIS — H02834 Dermatochalasis of left upper eyelid: Secondary | ICD-10-CM | POA: Diagnosis not present

## 2019-03-31 DIAGNOSIS — H02423 Myogenic ptosis of bilateral eyelids: Secondary | ICD-10-CM | POA: Diagnosis not present

## 2019-03-31 DIAGNOSIS — H02832 Dermatochalasis of right lower eyelid: Secondary | ICD-10-CM | POA: Diagnosis not present

## 2019-03-31 DIAGNOSIS — H02413 Mechanical ptosis of bilateral eyelids: Secondary | ICD-10-CM | POA: Diagnosis not present

## 2019-03-31 DIAGNOSIS — H02835 Dermatochalasis of left lower eyelid: Secondary | ICD-10-CM | POA: Diagnosis not present

## 2019-03-31 DIAGNOSIS — H02831 Dermatochalasis of right upper eyelid: Secondary | ICD-10-CM | POA: Diagnosis not present

## 2019-03-31 DIAGNOSIS — H0279 Other degenerative disorders of eyelid and periocular area: Secondary | ICD-10-CM | POA: Diagnosis not present

## 2019-04-13 DIAGNOSIS — H53481 Generalized contraction of visual field, right eye: Secondary | ICD-10-CM | POA: Diagnosis not present

## 2019-04-13 DIAGNOSIS — H53482 Generalized contraction of visual field, left eye: Secondary | ICD-10-CM | POA: Diagnosis not present

## 2019-04-13 DIAGNOSIS — H53483 Generalized contraction of visual field, bilateral: Secondary | ICD-10-CM | POA: Diagnosis not present

## 2019-06-15 MED FILL — SYNTHROID 137 MCG TABLET: 137 | 90 days supply | Qty: 90 | Fill #2

## 2019-07-01 DIAGNOSIS — E785 Hyperlipidemia, unspecified: Secondary | ICD-10-CM | POA: Diagnosis not present

## 2019-07-01 DIAGNOSIS — B372 Candidiasis of skin and nail: Secondary | ICD-10-CM | POA: Diagnosis not present

## 2019-07-01 DIAGNOSIS — R7301 Impaired fasting glucose: Secondary | ICD-10-CM | POA: Diagnosis not present

## 2019-07-01 DIAGNOSIS — N951 Menopausal and female climacteric states: Secondary | ICD-10-CM | POA: Diagnosis not present

## 2019-07-01 DIAGNOSIS — G4700 Insomnia, unspecified: Secondary | ICD-10-CM | POA: Diagnosis not present

## 2019-07-01 DIAGNOSIS — E559 Vitamin D deficiency, unspecified: Secondary | ICD-10-CM | POA: Diagnosis not present

## 2019-07-01 DIAGNOSIS — I10 Essential (primary) hypertension: Secondary | ICD-10-CM | POA: Diagnosis not present

## 2019-07-01 DIAGNOSIS — D649 Anemia, unspecified: Secondary | ICD-10-CM | POA: Diagnosis not present

## 2019-07-01 DIAGNOSIS — F329 Major depressive disorder, single episode, unspecified: Secondary | ICD-10-CM | POA: Diagnosis not present

## 2019-07-01 MED FILL — NYSTATIN 100,000 UNIT/GM CR: 100000 | 30 days supply | Qty: 60 | Fill #0

## 2019-07-01 MED FILL — NYSTATIN 100000 UNIT/GM POW: 100000 | 30 days supply | Qty: 60 | Fill #0

## 2019-07-01 MED FILL — traZODone HCL 50 MG TABS: 50 | 90 days supply | Qty: 90 | Fill #0

## 2019-07-01 MED FILL — FLUoxetine HCL 20 MG CAPS: 20 | 90 days supply | Qty: 90 | Fill #0

## 2019-07-01 MED FILL — BYSTOLIC 10 MG TABLET: 10 | 90 days supply | Qty: 90 | Fill #0

## 2019-07-02 DIAGNOSIS — N951 Menopausal and female climacteric states: Secondary | ICD-10-CM | POA: Diagnosis not present

## 2019-07-02 DIAGNOSIS — R7301 Impaired fasting glucose: Secondary | ICD-10-CM | POA: Diagnosis not present

## 2019-07-02 DIAGNOSIS — E559 Vitamin D deficiency, unspecified: Secondary | ICD-10-CM | POA: Diagnosis not present

## 2019-07-02 DIAGNOSIS — E785 Hyperlipidemia, unspecified: Secondary | ICD-10-CM | POA: Diagnosis not present

## 2019-07-02 DIAGNOSIS — E039 Hypothyroidism, unspecified: Secondary | ICD-10-CM | POA: Diagnosis not present

## 2019-07-02 DIAGNOSIS — D649 Anemia, unspecified: Secondary | ICD-10-CM | POA: Diagnosis not present

## 2019-07-06 MED FILL — VIT D2 1.25 MG (50,000 UNIT: 1.25 MG | 84 days supply | Qty: 24 | Fill #0

## 2019-07-10 ENCOUNTER — Other Ambulatory Visit (HOSPITAL_COMMUNITY): Payer: Self-pay | Admitting: Family Medicine

## 2019-07-19 DIAGNOSIS — H57813 Brow ptosis, bilateral: Secondary | ICD-10-CM | POA: Diagnosis not present

## 2019-07-19 DIAGNOSIS — H02835 Dermatochalasis of left lower eyelid: Secondary | ICD-10-CM | POA: Diagnosis not present

## 2019-07-19 DIAGNOSIS — H0279 Other degenerative disorders of eyelid and periocular area: Secondary | ICD-10-CM | POA: Diagnosis not present

## 2019-07-19 DIAGNOSIS — H02413 Mechanical ptosis of bilateral eyelids: Secondary | ICD-10-CM | POA: Diagnosis not present

## 2019-07-19 DIAGNOSIS — H53483 Generalized contraction of visual field, bilateral: Secondary | ICD-10-CM | POA: Diagnosis not present

## 2019-07-19 DIAGNOSIS — H02832 Dermatochalasis of right lower eyelid: Secondary | ICD-10-CM | POA: Diagnosis not present

## 2019-07-19 DIAGNOSIS — H02834 Dermatochalasis of left upper eyelid: Secondary | ICD-10-CM | POA: Diagnosis not present

## 2019-07-19 DIAGNOSIS — H02831 Dermatochalasis of right upper eyelid: Secondary | ICD-10-CM | POA: Diagnosis not present

## 2019-07-19 DIAGNOSIS — H02423 Myogenic ptosis of bilateral eyelids: Secondary | ICD-10-CM | POA: Diagnosis not present

## 2019-07-27 ENCOUNTER — Ambulatory Visit (INDEPENDENT_AMBULATORY_CARE_PROVIDER_SITE_OTHER): Payer: 59 | Admitting: Family

## 2019-07-27 ENCOUNTER — Other Ambulatory Visit: Payer: Self-pay

## 2019-07-27 ENCOUNTER — Encounter: Payer: Self-pay | Admitting: Family

## 2019-07-27 VITALS — BP 124/80 | HR 62 | Ht 64.0 in | Wt 185.0 lb

## 2019-07-27 DIAGNOSIS — I10 Essential (primary) hypertension: Secondary | ICD-10-CM

## 2019-07-27 DIAGNOSIS — I493 Ventricular premature depolarization: Secondary | ICD-10-CM | POA: Diagnosis not present

## 2019-07-27 DIAGNOSIS — R002 Palpitations: Secondary | ICD-10-CM

## 2019-07-27 DIAGNOSIS — R0600 Dyspnea, unspecified: Secondary | ICD-10-CM

## 2019-07-27 DIAGNOSIS — R5383 Other fatigue: Secondary | ICD-10-CM

## 2019-07-27 NOTE — Progress Notes (Signed)
Office Visit    Patient Name: Tasha Pearson Date of Encounter: 07/27/2019  Primary Care Provider:  Jonathon Resides, MD Primary Cardiologist:  Kathlyn Sacramento, MD Electrophysiologist:  None   Chief Complaint    Tasha Pearson is a 59 y.o. female with a hx of hypothyroidism, HTN, palpitation/symptomatic PVC presents today for overdue 6 month follow up of PVC with chief complaint of chest pain and fatigue.   Past Medical History    Past Medical History:  Diagnosis Date  . Arthritis    feet  . Chest pain    a. remote h/o nl stress test.  . Depression   . GERD (gastroesophageal reflux disease)   . H/O Graves' disease   . Headache(784.0)    rare  . Hiatal hernia   . Hypercholesteremia   . Hypothyroidism   . IBS (irritable bowel syndrome)    Pt reported on 01/18/13  . Morbid obesity ([place])   . Palpitations   . Pneumonia 20's   hx of  . PONV (postoperative nausea and vomiting)    severe vomiting  . Schwannoma    left ear  . Symptomatic PVCs    a. 02/2018 Zio: Avg HR 74. 2 short episodes of SVT - longest 4 beats @ 158. Rare PACs and PVCs (<1% burden). Some triggered events correlated w/ isolated PVCs; b. 02/2018 Echo: EF 55-60%, no rwma.   Past Surgical History:  Procedure Laterality Date  . ABDOMINAL HYSTERECTOMY     1 ovary left  . BIOPSY THYROID    . CESAREAN SECTION     x2  . CHOLECYSTECTOMY    . GASTRIC ROUX-EN-Y N/A 12/11/2015   Procedure: LAPAROSCOPIC ROUX-EN-Y GASTRIC BYPASS HIATEL HERNIA REPAIR WITH UPPER ENDOSCOPY;  Surgeon: Excell Seltzer, MD;  Location: [facility];  Service: General;  Laterality: N/A;  . LAPAROSCOPIC GASTRIC SLEEVE RESECTION N/A 04/26/2013   Procedure: LAPAROSCOPIC GASTRIC SLEEVE RESECTION;  Surgeon: Edward Jolly, MD;  Location: [facility];  Service: General;  Laterality: N/A;  . SPINE SURGERY     fusion, nerve release, and replaced a disc in lower back, l5-s1  . UPPER GI ENDOSCOPY N/A 04/26/2013   Procedure: UPPER GI ENDOSCOPY;  Surgeon:  Edward Jolly, MD;  Location: [facility];  Service: General;  Laterality: N/A;    Allergies  Allergies  Allergen Reactions  . Penicillins Rash    Has patient had a PCN reaction causing immediate rash, facial/tongue/throat swelling, SOB or lightheadedness with hypotension:YES Has patient had a PCN reaction causing severe rash involving mucus membranes or skin necrosis: NO Has patient had a PCN reaction that required hospitalizationNO Has patient had a PCN reaction occurring within the last 10 years: NO If all of the above answers are "NO", then may proceed with Cephalosporin use.    History of Present Illness    Tasha Pearson is a 59 y.o. female with a hx of palpitations/symptomatic PVC, hypothyroidism, HTN, depression, GERD. Additional hx of chest pain with remote normal stress testing. She was last seen 05/14/18 by Ignacia Bayley, NP.  Previously seen for symptomatic PVC with ZIO 02/2018 with rare PVC/PAC (less than 1% burden) and 2 short episodes of SVT. Triggered events associated with isolated PVC. Echo with normal LVEF. Conservative therapy recommended. Was not interested in beta blocker therapy at the time.   When last seen 05/14/18 noted elevated BP. Was recommended to check routinely at work as she has access to BP cuff however, she did not follow up.  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   Seen by primary care 07/01/19 for feeling "bad" with "nausea, diarrhea, lightheadedness, lack of concentration". Her Prozac was resumed for depression. He BP was elevated [id number] and Bystolic 10mg  started. Had lab work that is unfortunately unavailable in [place] for review. Per her report her iron was elevated, vitamin D low (supplementation started), liver function elevated, and cholesterol "okay". Her BP remained elevated so shew as started on Benicar HCT 20-12.5mg . BP at home now routinely 120s/70s.   Tells me this week she has had reduced energy/fatigue. Saturday while doing yard work her arms  became very fatigued, was weak, and lack of energy.   Having some discomfort in her left shoulder, her back, and her epigastric area. Pain occurs both at rest and with activity. Noted when laying in bed this morning, for example. Epigastric area is tender to palpation. Does have history of gastric bypass.   Had had diarrhea for over 1 week. Tells me it is getting better. We discussed needing to remain adequately hydrated.  Tells me her palpitations have been stable.   Has been on vitamin D for 1 month. Reports fatigue is about the same.   EKGs/Labs/Other Studies Reviewed:   The following studies were reviewed today: Echo 02/2018 - Left ventricle: The cavity size was normal. Wall thickness was    normal. Systolic function was normal. The estimated ejection    fraction was in the range of 55% to 60%. Wall motion was normal;    there were no regional wall motion abnormalities. Left    ventricular diastolic function parameters were normal.  - Pulmonary arteries: Systolic pressure could not be accurately    estimated.  - Inferior vena cava: The vessel was normal in size.   EKG:  EKG is ordered today.  The ekg ordered today demonstrates SR 62 bpm with no acute ST/T wave changes.  Recent Labs: No results found for requested labs within last 8760 hours.  Recent Lipid Panel    Component Value Date/Time   CHOL 245 (H) 09/13/2013 0937   TRIG 50 09/13/2013 0937   HDL 60 09/13/2013 0937   CHOLHDL 4.1 09/13/2013 0937   VLDL 10 09/13/2013 0937   LDLCALC 175 (H) 09/13/2013 0937   Home Medications   Current Meds  Medication Sig  . acetaminophen (TYLENOL) 500 MG tablet Take 500 mg by mouth every 6 (six) hours as needed for pain.  Marland Kitchen BYSTOLIC 10 MG tablet Take 10 mg by mouth daily.  . ergocalciferol (VITAMIN D2) 1.25 MG (50000 UT) capsule Take 50,000 Units by mouth. Twice a week  . FLUoxetine (PROZAC) 20 MG capsule Take 20 mg by mouth daily.  Marland Kitchen levothyroxine (SYNTHROID) 137 MCG tablet Take  137 mcg by mouth daily before breakfast.  . olmesartan-hydrochlorothiazide (BENICAR HCT) 20-12.5 MG tablet Take 1 tablet by mouth daily.    Review of Systems   Review of Systems  Constitution: Positive for malaise/fatigue. Negative for chills and fever.  Cardiovascular: Positive for chest pain and dyspnea on exertion. Negative for leg swelling, near-syncope, orthopnea, palpitations and syncope.  Respiratory: Negative for cough, shortness of breath and wheezing.   Gastrointestinal: Negative for nausea and vomiting.  Neurological: Negative for dizziness, light-headedness and weakness.   All other systems reviewed and are otherwise negative except as noted above.  Physical Exam    VS:  BP 124/80 (BP Location: Left Arm, Patient Position: Sitting, Cuff Size: Normal)   Pulse 62   Ht 5'  4" (1.626 m)   Wt 185 lb (83.9 kg)   SpO2 99%   BMI 31.76 kg/m  , BMI Body mass index is 31.76 kg/m. GEN: Well nourished, well developed, in no acute distress. HEENT: normal. Neck: Supple, no JVD, carotid bruits, or masses. Cardiac: RRR, no murmurs, rubs, or gallops. No clubbing, cyanosis, edema.  Radials/DP/PT 2+ and equal bilaterally.  Respiratory:  Respirations regular and unlabored, clear to auscultation bilaterally. GI: Soft, nontender, nondistended, BS + x 4. MS: No deformity or atrophy. Skin: Warm and dry, no rash. Neuro:  Strength and sensation are intact. Psych: Normal affect. Anxiou.  Assessment & Plan    1. HTN - Follows with PCP. BP well controlled on present regimen after recent medication initiation. Continue Bystolic 10mg  daily and Benicar HCTZ 20-12.5mg  daily. Low sodium diet recommended. She is very concerned regarding effects of HTN on her heart. Given severely elevated BP prior to medication initiation, dyspnea, and edema prior to initiation of HCTZ -  plan for echocardiogram.  2. Chest pain - Intermittent over last 4 days. Occurs at rest and with activity. Noted in epigastric  region, left shoulder, and back. Self resolves. No noted exacerbating or relieving factors. EKG today with no acute ST/T wave changes. Likely etiology GERD vs stress. Low suspicion angina as symptoms are atypical. Remote stress test without evidence of ischemia. Discussed option of Lexiscan Myoview and she prefers to prefer with echocardiogram first. Plan for echo, as above and will assess for wall motion abnormalities. If wall motion abnormalities noted, proceed with ischemic evaluation.  3. Fatigue - Likely multifactorial. Recent labs by PCP including TSH, vitamin D, CBC unfortunately unavailable for review, but will request. Started vitamin D supplementation approx 1 month ago for low vitamin D. Consider Bystolic as etiology. Her body likely is also adjusting to her now normal blood pressure. Depression and/or Zoloft could be contributory. Recommend she follow with her PCP.   4. Dyspnea - New symptom. Plan for echocardiogram, as above.   5. Palpitations/symptomatic PVC - Well controlled. Reports no recurrence. Continue Bystolic as ordered by PCP for hypertension.     Disposition: Follow up in 5 week(s) with Dr. Fletcher Anon or APP   Loel Dubonnet, NP 07/27/2019, 10:38 AM

## 2019-07-27 NOTE — Patient Instructions (Signed)
Medication Instructions:  Your physician recommends that you continue on your current medications as directed. Please refer to the Current Medication list given to you today.  *If you need a refill on your cardiac medications before your next appointment, please call your pharmacy*   Lab Work: none If you have labs (blood work) drawn today and your tests are completely normal, you will receive your results only by: Marland Kitchen MyChart Message (if you have MyChart) OR . A paper copy in the mail If you have any lab test that is abnormal or we need to change your treatment, we will call you to review the results.   Testing/Procedures: Your physician has requested that you have an echocardiogram. Echocardiography is a painless test that uses sound waves to create images of your heart. It provides your doctor with information about the size and shape of your heart and how well your heart's chambers and valves are working. This procedure takes approximately one hour. There are no restrictions for this procedure. You may get an IV, if needed, to receive an ultrasound enhancing agent through to better visualize your heart.   Follow-Up: At [facility], you and your health needs are our priority.  As part of our continuing mission to provide you with exceptional heart care, we have created designated Provider Care Teams.  These Care Teams include your primary Cardiologist (physician) and Advanced Practice Providers (APPs -  Physician Assistants and Nurse Practitioners) who all work together to provide you with the care you need, when you need it.  We recommend signing up for the patient portal called "MyChart".  Sign up information is provided on this After Visit Summary.  MyChart is used to connect with patients for Virtual Visits (Telemedicine).  Patients are able to view lab/test results, encounter notes, upcoming appointments, etc.  Non-urgent messages can be sent to your provider as well.   To learn more  about what you can do with MyChart, go to [email or web address].    Your next appointment:   After the echo preferably with Dr Fletcher Anon.   The format for your next appointment:   In Person  Provider:    You may see Kathlyn Sacramento, MD or one of the following Advanced Practice Providers on your designated Care Team:    Murray Hodgkins, NP  Christell Faith, PA-C  Marrianne Mood, PA-C   Echocardiogram An echocardiogram is a procedure that uses painless sound waves (ultrasound) to produce an image of the heart. Images from an echocardiogram can provide important information about:  Signs of coronary artery disease (CAD).  Aneurysm detection. An aneurysm is a weak or damaged part of an artery wall that bulges out from the normal force of blood pumping through the body.  Heart size and shape. Changes in the size or shape of the heart can be associated with certain conditions, including heart failure, aneurysm, and CAD.  Heart muscle function.  Heart valve function.  Signs of a past heart attack.  Fluid buildup around the heart.  Thickening of the heart muscle.  A tumor or infectious growth around the heart valves. Tell a health care provider about:  Any allergies you have.  All medicines you are taking, including vitamins, herbs, eye drops, creams, and over-the-counter medicines.  Any blood disorders you have.  Any surgeries you have had.  Any medical conditions you have.  Whether you are pregnant or may be pregnant. What are the risks? Generally, this is a safe procedure. However, problems may occur, including:  Allergic reaction to dye (contrast) that may be used during the procedure. What happens before the procedure? No specific preparation is needed. You may eat and drink normally. What happens during the procedure?   An IV tube may be inserted into one of your veins.  You may receive contrast through this tube. A contrast is an injection that improves the  quality of the pictures from your heart.  A gel will be applied to your chest.  A wand-like tool (transducer) will be moved over your chest. The gel will help to transmit the sound waves from the transducer.  The sound waves will harmlessly bounce off of your heart to allow the heart images to be captured in real-time motion. The images will be recorded on a computer. The procedure may vary among health care providers and hospitals. What happens after the procedure?  You may return to your normal, everyday life, including diet, activities, and medicines, unless your health care provider tells you not to do that. Summary  An echocardiogram is a procedure that uses painless sound waves (ultrasound) to produce an image of the heart.  Images from an echocardiogram can provide important information about the size and shape of your heart, heart muscle function, heart valve function, and fluid buildup around your heart.  You do not need to do anything to prepare before this procedure. You may eat and drink normally.  After the echocardiogram is completed, you may return to your normal, everyday life, unless your health care provider tells you not to do that. This information is not intended to replace advice given to you by your health care provider. Make sure you discuss any questions you have with your health care provider. Document Revised: 08/06/2018 Document Reviewed: 05/18/2016 Elsevier Patient Education  [place].

## 2019-08-04 ENCOUNTER — Other Ambulatory Visit: Payer: Self-pay

## 2019-08-04 ENCOUNTER — Ambulatory Visit (HOSPITAL_COMMUNITY): Payer: 59 | Attending: Cardiology

## 2019-08-04 DIAGNOSIS — R5383 Other fatigue: Secondary | ICD-10-CM | POA: Diagnosis not present

## 2019-08-04 DIAGNOSIS — I10 Essential (primary) hypertension: Secondary | ICD-10-CM

## 2019-08-04 DIAGNOSIS — R0600 Dyspnea, unspecified: Secondary | ICD-10-CM | POA: Diagnosis not present

## 2019-08-05 ENCOUNTER — Telehealth: Payer: Self-pay | Admitting: *Deleted

## 2019-08-05 NOTE — Telephone Encounter (Signed)
Spoke with patient and reviewed results and recommendations with her. She verbalized understanding of results with no further questions at this time.

## 2019-08-05 NOTE — Telephone Encounter (Signed)
-----   Message from Loel Dubonnet, NP sent at 08/05/2019  9:17 AM EDT ----- Echocardiogram shows normal pumping function. No wall motion abnormalities suggestive of blockage. Mildly leaky mitral valve - our treatment plan for this will be to keep blood pressure well controlled. Overall great result!

## 2019-08-05 NOTE — Telephone Encounter (Signed)
Left voicemail message for her to call back for results.

## 2019-08-09 MED FILL — OLMESARTAN-HCTZ 20-12.5 MG: 20-12.5 | 30 days supply | Qty: 30 | Fill #0

## 2019-08-23 DIAGNOSIS — I10 Essential (primary) hypertension: Secondary | ICD-10-CM | POA: Diagnosis not present

## 2019-08-31 ENCOUNTER — Ambulatory Visit (INDEPENDENT_AMBULATORY_CARE_PROVIDER_SITE_OTHER): Payer: 59 | Admitting: Cardiovascular Disease

## 2019-08-31 ENCOUNTER — Encounter: Payer: Self-pay | Admitting: Cardiovascular Disease

## 2019-08-31 ENCOUNTER — Other Ambulatory Visit: Payer: Self-pay

## 2019-08-31 VITALS — BP 110/60 | HR 59 | Ht 64.0 in | Wt 190.1 lb

## 2019-08-31 DIAGNOSIS — I493 Ventricular premature depolarization: Secondary | ICD-10-CM

## 2019-08-31 DIAGNOSIS — I10 Essential (primary) hypertension: Secondary | ICD-10-CM

## 2019-08-31 MED ORDER — NEBIVOLOL HCL 5 MG PO TABS: 5.0000 mg | ORAL_TABLET | Freq: Every day | ORAL | 3 refills | Status: DC

## 2019-08-31 MED FILL — BYSTOLIC 5 MG TABLET: 5 | 90 days supply | Qty: 90 | Fill #0

## 2019-08-31 NOTE — Progress Notes (Signed)
Cardiology Office Note   Date:  08/31/2019   ID:  Tasha Pearson, DOB [birth year], MRN [id number]  PCP:  Jonathon Resides, MD  Cardiologist:   Kathlyn Sacramento, MD   Chief Complaint  Patient presents with  . OTHER    F/u echo. Meds reviewed verbally with pt.      History of Present Illness: Tasha Pearson is a 59 y.o. female who is here today for follow-up regarding PVCs and elevated blood pressure.  She has history of hypothyroidism, essential hypertension and obesity status weight loss surgery.   She was seen in the past for palpitations with work-up revealing PVCs with low burden overall.  Echo showed normal LV systolic function.  She felt bad in March with dizziness, nausea and lack of concentration.  She was noted to be hypertensive with blood pressure of 189/116.  She was started on Bystolic 10 mg daily.  Blood pressure remained elevated and she was started on Benicar hydrochlorothiazide. She had an echocardiogram done which showed an EF of 60 to 123456, normal diastolic function no significant valvular abnormalities. She has been doing well and she is back to baseline after controlling her blood pressure.  If anything, her blood pressure has been on the low side and she has been feeling tired and fatigued.  No chest pain or shortness of breath.  She has strong family history of essential hypertension.   Past Medical History:  Diagnosis Date  . Arthritis    feet  . Chest pain    a. remote h/o nl stress test.  . Depression   . GERD (gastroesophageal reflux disease)   . H/O Graves' disease   . Headache(784.0)    rare  . Hiatal hernia   . Hypercholesteremia   . Hypothyroidism   . IBS (irritable bowel syndrome)    Pt reported on 01/18/13  . Morbid obesity ([place])   . Palpitations   . Pneumonia 20's   hx of  . PONV (postoperative nausea and vomiting)    severe vomiting  . Schwannoma    left ear  . Symptomatic PVCs    a. 02/2018 Zio: Avg HR 74. 2 short episodes of SVT - longest 4  beats @ 158. Rare PACs and PVCs (<1% burden). Some triggered events correlated w/ isolated PVCs; b. 02/2018 Echo: EF 55-60%, no rwma.    Past Surgical History:  Procedure Laterality Date  . ABDOMINAL HYSTERECTOMY     1 ovary left  . BIOPSY THYROID    . CESAREAN SECTION     x2  . CHOLECYSTECTOMY    . GASTRIC ROUX-EN-Y N/A 12/11/2015   Procedure: LAPAROSCOPIC ROUX-EN-Y GASTRIC BYPASS HIATEL HERNIA REPAIR WITH UPPER ENDOSCOPY;  Surgeon: Excell Seltzer, MD;  Location: [facility];  Service: General;  Laterality: N/A;  . LAPAROSCOPIC GASTRIC SLEEVE RESECTION N/A 04/26/2013   Procedure: LAPAROSCOPIC GASTRIC SLEEVE RESECTION;  Surgeon: Edward Jolly, MD;  Location: [facility];  Service: General;  Laterality: N/A;  . SPINE SURGERY     fusion, nerve release, and replaced a disc in lower back, l5-s1  . UPPER GI ENDOSCOPY N/A 04/26/2013   Procedure: UPPER GI ENDOSCOPY;  Surgeon: Edward Jolly, MD;  Location: [facility];  Service: General;  Laterality: N/A;     Current Outpatient Medications  Medication Sig Dispense Refill  . acetaminophen (TYLENOL) 500 MG tablet Take 500 mg by mouth every 6 (six) hours as needed for pain.    Marland Kitchen BYSTOLIC 10 MG tablet Take 10  mg by mouth daily.    . ergocalciferol (VITAMIN D2) 1.25 MG (50000 UT) capsule Take 50,000 Units by mouth. Twice a week    . FLUoxetine (PROZAC) 20 MG capsule Take 20 mg by mouth daily.    Marland Kitchen levothyroxine (SYNTHROID) 137 MCG tablet Take 137 mcg by mouth daily before breakfast.    . olmesartan-hydrochlorothiazide (BENICAR HCT) 20-12.5 MG tablet Take 1 tablet by mouth daily.     No current facility-administered medications for this visit.    Allergies:   Penicillins    Social History:  The patient  reports that she quit smoking about 11 years ago. Her smoking use included cigarettes. She quit after 10.00 years of use. She has never used smokeless tobacco. She reports current alcohol use. She reports that she does not use drugs.   Family  History:  The patient's family history includes Diabetes in her brother; Heart disease in her father; Pancreatic cancer in her mother.    ROS:  Please see the history of present illness.   Otherwise, review of systems are positive for none.   All other systems are reviewed and negative.    PHYSICAL EXAM: VS:  BP 110/60 (BP Location: Left Arm, Patient Position: Sitting, Cuff Size: Normal)   Pulse (!) 59   Ht 5\' 4"  (1.626 m)   Wt 190 lb 2 oz (86.2 kg)   SpO2 96%   BMI 32.63 kg/m  , BMI Body mass index is 32.63 kg/m. GEN: Well nourished, well developed, in no acute distress  HEENT: normal  Neck: no JVD, carotid bruits, or masses Cardiac: RRR; no murmurs, rubs, or gallops,no edema  Respiratory:  clear to auscultation bilaterally, normal work of breathing GI: soft, nontender, nondistended, + BS MS: no deformity or atrophy  Skin: warm and dry, no rash Neuro:  Strength and sensation are intact Psych: euthymic mood, full affect   EKG:  EKG is ordered today. The ekg ordered today demonstrates sinus bradycardia with no significant ST or T wave changes.  Recent Labs: No results found for requested labs within last 8760 hours.    Lipid Panel    Component Value Date/Time   CHOL 245 (H) 09/13/2013 0937   TRIG 50 09/13/2013 0937   HDL 60 09/13/2013 0937   CHOLHDL 4.1 09/13/2013 0937   VLDL 10 09/13/2013 0937   LDLCALC 175 (H) 09/13/2013 0937      Wt Readings from Last 3 Encounters:  08/31/19 190 lb 2 oz (86.2 kg)  07/27/19 185 lb (83.9 kg)  05/14/18 182 lb (82.6 kg)       PAD Screen 03/05/2018  Previous PAD dx? No  Previous surgical procedure? No  Pain with walking? No  Feet/toe relief with dangling? No  Painful, non-healing ulcers? No  Extremities discolored? No      ASSESSMENT AND PLAN:  1.  Essential hypertension: Blood pressure is now well controlled and if anything, is on the low side with associated fatigue.  She is also mildly bradycardic.  Thus, I elected to  decrease Bystolic to 5 mg daily.  2.  PVCs: No recent palpitations and they seem to be well controlled with Bystolic.    Disposition:   FU with me in 12 months  Signed,  Kathlyn Sacramento, MD  08/31/2019 2:09 PM    [place]

## 2019-08-31 NOTE — Patient Instructions (Signed)
Medication Instructions:  Your physician has recommended you make the following change in your medication:   DECREASE Bystolic to 5 mg daily. An Rx has been sent to your pharmacy.  *If you need a refill on your cardiac medications before your next appointment, please call your pharmacy*   Lab Work: None ordered If you have labs (blood work) drawn today and your tests are completely normal, you will receive your results only by: Marland Kitchen MyChart Message (if you have MyChart) OR . A paper copy in the mail If you have any lab test that is abnormal or we need to change your treatment, we will call you to review the results.   Testing/Procedures: None ordered   Follow-Up: At [facility], you and your health needs are our priority.  As part of our continuing mission to provide you with exceptional heart care, we have created designated Provider Care Teams.  These Care Teams include your primary Cardiologist (physician) and Advanced Practice Providers (APPs -  Physician Assistants and Nurse Practitioners) who all work together to provide you with the care you need, when you need it.  We recommend signing up for the patient portal called "MyChart".  Sign up information is provided on this After Visit Summary.  MyChart is used to connect with patients for Virtual Visits (Telemedicine).  Patients are able to view lab/test results, encounter notes, upcoming appointments, etc.  Non-urgent messages can be sent to your provider as well.   To learn more about what you can do with MyChart, go to [email or web address].    Your next appointment:   12 month(s)  The format for your next appointment:   In Person  Provider:    You may see Kathlyn Sacramento, MD or one of the following Advanced Practice Providers on your designated Care Team:    Murray Hodgkins, NP  Christell Faith, PA-C  Marrianne Mood, PA-C    Other Instructions N/A

## 2019-11-15 MED FILL — SYNTHROID 137 MCG TABLET: 137 | 90 days supply | Qty: 90 | Fill #0

## 2020-01-27 MED FILL — BYSTOLIC 10 MG TABLET: 10 | 90 days supply | Qty: 90 | Fill #1

## 2020-01-27 MED FILL — FLUoxetine HCL 20 MG CAPS: 20 | 90 days supply | Qty: 90 | Fill #1

## 2020-01-27 MED FILL — OLMESARTAN-HCTZ 20-12.5 MG: 20-12.5 | 90 days supply | Qty: 90 | Fill #1

## 2020-02-15 ENCOUNTER — Other Ambulatory Visit (HOSPITAL_COMMUNITY): Payer: Self-pay | Admitting: Family Medicine

## 2020-02-15 DIAGNOSIS — R41840 Attention and concentration deficit: Secondary | ICD-10-CM | POA: Diagnosis not present

## 2020-02-15 DIAGNOSIS — F32A Depression, unspecified: Secondary | ICD-10-CM | POA: Diagnosis not present

## 2020-02-15 DIAGNOSIS — F1010 Alcohol abuse, uncomplicated: Secondary | ICD-10-CM | POA: Diagnosis not present

## 2020-02-15 MED FILL — VYVANSE 50 MG CAPSULE: 50 | 30 days supply | Qty: 30 | Fill #0

## 2020-03-10 ENCOUNTER — Other Ambulatory Visit (HOSPITAL_COMMUNITY): Payer: Self-pay | Admitting: Family Medicine

## 2020-03-10 DIAGNOSIS — R41840 Attention and concentration deficit: Secondary | ICD-10-CM | POA: Diagnosis not present

## 2020-03-10 DIAGNOSIS — F411 Generalized anxiety disorder: Secondary | ICD-10-CM | POA: Diagnosis not present

## 2020-03-10 DIAGNOSIS — Z0289 Encounter for other administrative examinations: Secondary | ICD-10-CM | POA: Diagnosis not present

## 2020-03-10 DIAGNOSIS — F331 Major depressive disorder, recurrent, moderate: Secondary | ICD-10-CM | POA: Diagnosis not present

## 2020-03-10 MED FILL — FLUoxetine HCL 40 MG CAPS: 40 | 90 days supply | Qty: 90 | Fill #0

## 2020-03-29 MED FILL — SYNTHROID 137 MCG TABLET: 137 | 90 days supply | Qty: 90 | Fill #1

## 2020-03-29 MED FILL — VYVANSE 50 MG CAPSULE: 50 | 30 days supply | Qty: 30 | Fill #0

## 2020-05-10 ENCOUNTER — Other Ambulatory Visit (HOSPITAL_COMMUNITY): Payer: Self-pay | Admitting: Family Medicine

## 2020-05-10 DIAGNOSIS — E039 Hypothyroidism, unspecified: Secondary | ICD-10-CM | POA: Diagnosis not present

## 2020-05-10 DIAGNOSIS — E785 Hyperlipidemia, unspecified: Secondary | ICD-10-CM | POA: Diagnosis not present

## 2020-05-10 DIAGNOSIS — E559 Vitamin D deficiency, unspecified: Secondary | ICD-10-CM | POA: Diagnosis not present

## 2020-05-10 DIAGNOSIS — B372 Candidiasis of skin and nail: Secondary | ICD-10-CM | POA: Diagnosis not present

## 2020-05-10 DIAGNOSIS — F331 Major depressive disorder, recurrent, moderate: Secondary | ICD-10-CM | POA: Diagnosis not present

## 2020-05-10 DIAGNOSIS — D649 Anemia, unspecified: Secondary | ICD-10-CM | POA: Diagnosis not present

## 2020-05-10 DIAGNOSIS — F411 Generalized anxiety disorder: Secondary | ICD-10-CM | POA: Diagnosis not present

## 2020-05-10 DIAGNOSIS — I10 Essential (primary) hypertension: Secondary | ICD-10-CM | POA: Diagnosis not present

## 2020-05-10 DIAGNOSIS — R251 Tremor, unspecified: Secondary | ICD-10-CM | POA: Diagnosis not present

## 2020-05-10 MED FILL — PROPRANOLOL 20 MG TABLET: 20 | 90 days supply | Qty: 180 | Fill #0

## 2020-05-10 MED FILL — NYSTATIN 100,000 UNIT/GM PO: 100000 | 30 days supply | Qty: 60 | Fill #0

## 2020-05-10 MED FILL — OLMESARTAN-HCTZ 20-12.5 MG: 20-12.5 | 90 days supply | Qty: 90 | Fill #0

## 2020-05-10 MED FILL — NYSTATIN 100,000 UNIT/GM CR: 100000 | 30 days supply | Qty: 60 | Fill #0

## 2020-05-11 ENCOUNTER — Encounter: Payer: Self-pay | Admitting: Neurology

## 2020-05-18 DIAGNOSIS — R251 Tremor, unspecified: Secondary | ICD-10-CM | POA: Diagnosis not present

## 2020-05-18 DIAGNOSIS — E039 Hypothyroidism, unspecified: Secondary | ICD-10-CM | POA: Diagnosis not present

## 2020-05-18 DIAGNOSIS — E559 Vitamin D deficiency, unspecified: Secondary | ICD-10-CM | POA: Diagnosis not present

## 2020-05-18 DIAGNOSIS — D649 Anemia, unspecified: Secondary | ICD-10-CM | POA: Diagnosis not present

## 2020-05-18 DIAGNOSIS — I10 Essential (primary) hypertension: Secondary | ICD-10-CM | POA: Diagnosis not present

## 2020-05-18 DIAGNOSIS — E785 Hyperlipidemia, unspecified: Secondary | ICD-10-CM | POA: Diagnosis not present

## 2020-05-25 NOTE — Progress Notes (Signed)
Assessment/Plan:   1.  Tremor  -suspect multifactorial and related to vyvanse and GAD.  -Patient to contact her prescribing physician for the Vyvanse to see if there are alternatives.  -Discussed counseling with the patient. I think that GAD plays a significant role here. Patient agreeable.  -We will try to increase her propranolol to 20 mg 3 times daily, but discussed issues with lowering of the blood pressure and bradycardia. Her blood pressure is already fairly low and she is to keep an eye on this.  R/B/SE were discussed.  The opportunity to ask questions was given and they were answered to the best of my ability.  The patient expressed understanding and willingness to follow the outlined treatment protocols.  -Her primary care recently drew blood work. Patient states that it is not back yet (I do not see it either). Patient to let me know when she gets a copy of it so that we can as well.  2.  F/u 6 months   Subjective:   Tasha Pearson was seen in consultation in the movement disorder clinic at the request of Zanard, Bernadene Bell, MD.  The evaluation is for tremor.  Medical records from primary care have been reviewed.  Primary care notes are dated May 10, 2020.  Tremor worse after "nervous breakdown."    Tremor started approximately 1 year ago.  She states today that there were no known initiating factors.  She does state that she had a "nervous breakdown" in October and sx's were worse after that.  She has head tremor in the AM and then can awaken with full body tremor.  She was not hospitalized for that "breakdown."  She was started on prozac and started on vyvanse after this.   Tremor involves the bilateral UE.  Tremor is most noticeable when using the hands.  Noting tremor with cooking or grabbing a bowl.  Tremor seems to come and go.  There is a family hx of tremor in maternal aunt with Parkinsons Disease and paternal GF with Parkinsons Disease.   Affected by caffeine:  (doesn't drink  any) Affected by alcohol:  No. (drinks 2-3 glasses qo day) Affected by stress:  Yes.   Affected by fatigue:  No. ("i'm exhausted all of the time - I don't sleep") Spills soup if on spoon:  No. Spills glass of liquid if full:  No. Affects ADL's (tying shoes, brushing teeth, etc):  No.  Current/Previously tried tremor medications: Patient started on propranolol, 20 mg twice per day on January 12 for tremor.  She states that she has noted no change with this  Current medications that may exacerbate tremor:  Vyvanse  Outside reports reviewed: historical medical records, office notes and referral letter/letters. C/o poor memory.  Works in cath lab.  Trouble with word finding.  Trouble with remembering patient names.  occ with trouble remembering meds.  No problems with paying monthly bills  Allergies  Allergen Reactions  . Penicillins Rash    Has patient had a PCN reaction causing immediate rash, facial/tongue/throat swelling, SOB or lightheadedness with hypotension:YES Has patient had a PCN reaction causing severe rash involving mucus membranes or skin necrosis: NO Has patient had a PCN reaction that required hospitalizationNO Has patient had a PCN reaction occurring within the last 10 years: NO If all of the above answers are "NO", then may proceed with Cephalosporin use.    Current Outpatient Medications  Medication Instructions  . acetaminophen (TYLENOL) 500 mg, Every 6 hours PRN  .  FLUoxetine (PROZAC) 20 mg, Oral, Daily  . levothyroxine (SYNTHROID) 137 mcg, Oral, Daily before breakfast  . lisdexamfetamine (VYVANSE) 50 MG capsule Oral  . olmesartan-hydrochlorothiazide (BENICAR HCT) 20-12.5 MG tablet 1 tablet, Oral, Daily  . propranolol (INDERAL) 20 mg, Oral, 2 times daily     Objective:   VITALS:   Vitals:   05/26/20 0941  BP: 108/80  Pulse: 66  SpO2: 99%  Weight: 204 lb (92.5 kg)  Height: 5\' 5"  (1.651 m)   Gen:  Appears stated age and in NAD.  She is anxious appearing  and intermittently tearful. HEENT:  Normocephalic, atraumatic. The mucous membranes are moist. The superficial temporal arteries are without ropiness or tenderness. Cardiovascular: Regular rate and rhythm. Lungs: Clear to auscultation bilaterally. Neck: There are no carotid bruits noted bilaterally.  NEUROLOGICAL:  Orientation:  The patient is alert and oriented x 3.   Cranial nerves: There is good facial symmetry. Extraocular muscles are intact and visual fields are full to confrontational testing. Speech is fluent and clear. Soft palate rises symmetrically and there is no tongue deviation. Hearing is intact to conversational tone. Tone: Tone is good throughout. Sensation: Sensation is intact to light touch touch throughout (facial, trunk, extremities). Vibration is intact at the bilateral big toe. There is no extinction with double simultaneous stimulation. There is no sensory dermatomal level identified. Coordination:  The patient has no dysdiadichokinesia or dysmetria. Motor: Strength is 5/5 in the bilateral upper and lower extremities.  Shoulder shrug is equal bilaterally.  There is no pronator drift.  There are no fasciculations noted. DTR's: Deep tendon reflexes are 2+-3-/4 at the bilateral biceps, triceps, brachioradialis, patella and achilles.  Plantar responses are downgoing bilaterally. Gait and Station: The patient is able to ambulate without difficulty. The patient is able to heel toe walk without any difficulty. The patient is able to ambulate in a tandem fashion.   MOVEMENT EXAM: Tremor:  There is no rest tremor, even with distraction procedures. No postural tremor. Very little intention tremor. With that, there is some entrainment. No trouble with Archimedes spirals on the right. Some tremor with Archimedes spirals on the left. Able to pour water from 1 glass to another.  I have reviewed and interpreted the following labs independently   Chemistry      Component Value Date/Time    NA 142 03/05/2018 1538   K 4.0 03/05/2018 1538   CL 104 03/05/2018 1538   CO2 25 03/05/2018 1538   BUN 15 03/05/2018 1538   CREATININE 0.94 03/05/2018 1538   CREATININE 0.75 08/09/2013 1332      Component Value Date/Time   CALCIUM 9.4 03/05/2018 1538   ALKPHOS 65 12/05/2015 0830   AST 19 12/05/2015 0830   ALT 14 12/05/2015 0830   BILITOT 0.6 12/05/2015 0830      Lab Results  Component Value Date   WBC 5.3 03/05/2018   HGB 13.1 03/05/2018   HCT 39.7 03/05/2018   MCV 88 03/05/2018   PLT 265 03/05/2018   Lab Results  Component Value Date   TSH 0.721 03/05/2018   I can see that labs were done through primary care, but did not receive a copy and for some reason they are not coming up through care everywhere.   Total time spent on today's visit was 45 minutes, including both face-to-face time and nonface-to-face time.  Time included that spent on review of records (prior notes available to me/labs/imaging if pertinent), discussing treatment and goals, answering patient's questions and coordinating  care.  CC:  Jonathon Resides, MD

## 2020-05-26 ENCOUNTER — Ambulatory Visit: Payer: 59 | Admitting: Neurology

## 2020-05-26 ENCOUNTER — Other Ambulatory Visit: Payer: Self-pay

## 2020-05-26 ENCOUNTER — Encounter: Payer: Self-pay | Admitting: Neurology

## 2020-05-26 VITALS — BP 108/80 | HR 66 | Ht 65.0 in | Wt 204.0 lb

## 2020-05-26 DIAGNOSIS — R251 Tremor, unspecified: Secondary | ICD-10-CM | POA: Diagnosis not present

## 2020-05-26 DIAGNOSIS — F411 Generalized anxiety disorder: Secondary | ICD-10-CM

## 2020-05-26 NOTE — Patient Instructions (Addendum)
1.  Increase propranolol 20 mg three times per day.  Keep an eye on your blood pressure 2.  Talk to your prescribing physician about your Vyvanse 3.  We will send a referral to counseling

## 2020-06-17 DIAGNOSIS — H524 Presbyopia: Secondary | ICD-10-CM | POA: Diagnosis not present

## 2020-06-30 MED FILL — SYNTHROID 137 MCG TABLET: 137 | 90 days supply | Qty: 90 | Fill #2

## 2020-06-30 MED FILL — FLUoxetine HCL 40 MG CAPS: 40 | 90 days supply | Qty: 90 | Fill #0

## 2020-09-20 ENCOUNTER — Other Ambulatory Visit (HOSPITAL_COMMUNITY): Payer: Self-pay

## 2020-09-20 ENCOUNTER — Other Ambulatory Visit (HOSPITAL_COMMUNITY): Payer: Self-pay | Admitting: Family Medicine

## 2020-09-20 MED FILL — Olmesartan Medoxomil-Hydrochlorothiazide Tab 20-12.5 MG: ORAL | 90 days supply | Qty: 90 | Fill #0 | Status: AC

## 2020-09-20 MED FILL — Fluoxetine HCl Cap 40 MG: ORAL | 90 days supply | Qty: 90 | Fill #0 | Status: AC

## 2020-09-20 MED FILL — Fluoxetine HCl Cap 40 MG: ORAL | Qty: 90 | Fill #0 | Status: CN

## 2020-09-21 ENCOUNTER — Other Ambulatory Visit (HOSPITAL_COMMUNITY): Payer: Self-pay

## 2020-11-01 ENCOUNTER — Other Ambulatory Visit (HOSPITAL_COMMUNITY): Payer: Self-pay

## 2020-11-01 MED ORDER — SYNTHROID 137 MCG PO TABS
137.0000 ug | ORAL_TABLET | Freq: Every morning | ORAL | 0 refills | Status: DC
  Filled 2020-11-01: qty 90, 90d supply, fill #0

## 2020-11-03 ENCOUNTER — Other Ambulatory Visit (HOSPITAL_COMMUNITY): Payer: Self-pay

## 2020-11-09 ENCOUNTER — Other Ambulatory Visit (HOSPITAL_COMMUNITY): Payer: Self-pay

## 2020-11-13 ENCOUNTER — Other Ambulatory Visit (HOSPITAL_COMMUNITY): Payer: Self-pay

## 2020-11-13 MED ORDER — CYANOCOBALAMIN 1000 MCG/ML IJ SOLN
INTRAMUSCULAR | 1 refills | Status: AC
  Filled 2020-11-13: qty 3, 90d supply, fill #0

## 2020-11-13 MED ORDER — ERGOCALCIFEROL 1.25 MG (50000 UT) PO CAPS
1.0000 | ORAL_CAPSULE | ORAL | 0 refills | Status: AC
  Filled 2020-11-13: qty 24, 84d supply, fill #0

## 2020-11-13 MED ORDER — SYNTHROID 137 MCG PO TABS
137.0000 ug | ORAL_TABLET | Freq: Every morning | ORAL | 3 refills | Status: DC
  Filled 2020-11-13: qty 90, 90d supply, fill #0

## 2020-11-21 NOTE — Progress Notes (Deleted)
Assessment/Plan:    1.  Essential Tremor  ***  Subjective:   Tasha Pearson was seen today in follow up for tremor.  My previous records were reviewed prior to todays visit.  Patient last seen in January.  Tremor felt multifactorial, and primarily related to Vyvanse and GAD.  We did start her on propranolol, 20 mg 3 times daily.  She is no longer on Vyvanse.  She reports today that ***. pt denies falls.  Pt denies lightheadedness, near syncope.  No hallucinations.  Mood has been good.  Current prescribed movement disorder medications: ***Propranolol, 20 mg 3 times per day (started last visit)   PREVIOUS MEDICATIONS: {Parkinson's RX:18200}  ALLERGIES:   Allergies  Allergen Reactions   Penicillins Rash    Has patient had a PCN reaction causing immediate rash, facial/tongue/throat swelling, SOB or lightheadedness with hypotension:YES Has patient had a PCN reaction causing severe rash involving mucus membranes or skin necrosis: NO Has patient had a PCN reaction that required hospitalizationNO Has patient had a PCN reaction occurring within the last 10 years: NO If all of the above answers are "NO", then may proceed with Cephalosporin use.    CURRENT MEDICATIONS:  Outpatient Encounter Medications as of 11/23/2020  Medication Sig   acetaminophen (TYLENOL) 500 MG tablet Take 500 mg by mouth every 6 (six) hours as needed for pain.   cyanocobalamin (,VITAMIN B-12,) 1000 MCG/ML injection Inject 1 mL (1,000 mcg total) into the muscle every 30 days.   ergocalciferol (VITAMIN D2) 1.25 MG (50000 UT) capsule Take 1 capsule (50,000 Units total) by mouth twice weekly   FLUoxetine (PROZAC) 20 MG capsule Take 20 mg by mouth daily.   FLUoxetine (PROZAC) 40 MG capsule Take 1 capsule (40 mg total) by mouth daily.   FLUoxetine (PROZAC) 40 MG capsule TAKE 1 CAPSULE (40 MG TOTAL) BY MOUTH DAILY.   FLUoxetine (PROZAC) 40 MG capsule TAKE 1 CAPSULE (40 MG TOTAL) BY MOUTH DAILY.   levothyroxine  (SYNTHROID) 137 MCG tablet Take 137 mcg by mouth daily before breakfast.   lisdexamfetamine (VYVANSE) 50 MG capsule Take by mouth.   lisdexamfetamine (VYVANSE) 50 MG capsule TAKE 1 CAPSULE (50 MG TOTAL) BY MOUTH EVERY MORNING FOR 30 DAYS.FILL 07/14/20   lisdexamfetamine (VYVANSE) 50 MG capsule TAKE 1 CAPSULE (50 MG TOTAL) BY MOUTH EVERY MORNING FOR 30 DAYS.FILL 05/16/20   lisdexamfetamine (VYVANSE) 50 MG capsule TAKE 1 CAPSULE (50 MG TOTAL) BY MOUTH EVERY MORNING FOR 30 DAYS. FILL 06/16/20   lisdexamfetamine (VYVANSE) 50 MG capsule TAKE 1 CAPSULE (50 MG TOTAL) BY MOUTH EVERY MORNING.   nystatin (MYCOSTATIN/NYSTOP) powder APPLY TOPICALLY 2 TIMES DAILY.   nystatin cream (MYCOSTATIN) APPLY TOPICALLY 2 TIMES DAILY.   olmesartan-hydrochlorothiazide (BENICAR HCT) 20-12.5 MG tablet Take 1 tablet by mouth daily.   olmesartan-hydrochlorothiazide (BENICAR HCT) 20-12.5 MG tablet TAKE 1 TABLET BY MOUTH IN THE MORNING.   propranolol (INDERAL) 20 MG tablet Take 20 mg by mouth 2 (two) times daily.   propranolol (INDERAL) 20 MG tablet TAKE 1 TABLET (20 MG TOTAL) BY MOUTH 2 TIMES DAILY.   SYNTHROID 137 MCG tablet TAKE 1 TABLET BY MOUTH ONCE DAILY IN THE MORNING.   SYNTHROID 137 MCG tablet Take 1 tablet (137 mcg total) by mouth in the morning.   SYNTHROID 137 MCG tablet Take 1 tablet (137 mcg total) by mouth in the morning.   No facility-administered encounter medications on file as of 11/23/2020.     Objective:    PHYSICAL EXAMINATION:  VITALS:  There were no vitals filed for this visit.  GEN:  The patient appears stated age and is in NAD. HEENT:  Normocephalic, atraumatic.  The mucous membranes are moist. The superficial temporal arteries are without ropiness or tenderness. CV:  RRR Lungs:  CTAB Neck/HEME:  There are no carotid bruits bilaterally.  Neurological examination:  Orientation: The patient is alert and oriented x3. Cranial nerves: There is good facial symmetry. The speech is fluent and  clear. Soft palate rises symmetrically and there is no tongue deviation. Hearing is intact to conversational tone. Sensation: Sensation is intact to light touch throughout Motor: Strength is at least antigravity x4.  Movement examination: Tone: There is normal tone in the UE/LE Abnormal movements: *** There is no rest tremor, even with distraction procedures. No postural tremor. Very little intention tremor. With that, there is some entrainment. No trouble with Archimedes spirals on the right. Some tremor with Archimedes spirals on the left. Able to pour water from 1 glass to another. Coordination:  There is *** decremation with RAM's, *** Gait and Station: The patient has *** difficulty arising out of a deep-seated chair without the use of the hands. The patient's stride length is good I have reviewed and interpreted the following labs independently   Chemistry      Component Value Date/Time   NA 142 03/05/2018 1538   K 4.0 03/05/2018 1538   CL 104 03/05/2018 1538   CO2 25 03/05/2018 1538   BUN 15 03/05/2018 1538   CREATININE 0.94 03/05/2018 1538   CREATININE 0.75 08/09/2013 1332      Component Value Date/Time   CALCIUM 9.4 03/05/2018 1538   ALKPHOS 65 12/05/2015 0830   AST 19 12/05/2015 0830   ALT 14 12/05/2015 0830   BILITOT 0.6 12/05/2015 0830      Lab Results  Component Value Date   WBC 5.3 03/05/2018   HGB 13.1 03/05/2018   HCT 39.7 03/05/2018   MCV 88 03/05/2018   PLT 265 03/05/2018   Lab Results  Component Value Date   TSH 0.721 03/05/2018     Chemistry      Component Value Date/Time   NA 142 03/05/2018 1538   K 4.0 03/05/2018 1538   CL 104 03/05/2018 1538   CO2 25 03/05/2018 1538   BUN 15 03/05/2018 1538   CREATININE 0.94 03/05/2018 1538   CREATININE 0.75 08/09/2013 1332      Component Value Date/Time   CALCIUM 9.4 03/05/2018 1538   ALKPHOS 65 12/05/2015 0830   AST 19 12/05/2015 0830   ALT 14 12/05/2015 0830   BILITOT 0.6 12/05/2015 0830          Total time spent on today's visit was ***30 minutes, including both face-to-face time and nonface-to-face time.  Time included that spent on review of records (prior notes available to me/labs/imaging if pertinent), discussing treatment and goals, answering patient's questions and coordinating care.  Cc:  Jonathon Resides, MD

## 2020-11-23 ENCOUNTER — Ambulatory Visit: Payer: 59 | Admitting: Neurology

## 2021-02-27 ENCOUNTER — Other Ambulatory Visit (HOSPITAL_COMMUNITY): Payer: Self-pay

## 2021-02-27 MED FILL — Olmesartan Medoxomil-Hydrochlorothiazide Tab 20-12.5 MG: ORAL | 90 days supply | Qty: 90 | Fill #1 | Status: CN

## 2021-02-28 ENCOUNTER — Other Ambulatory Visit (HOSPITAL_COMMUNITY): Payer: Self-pay

## 2021-03-02 ENCOUNTER — Other Ambulatory Visit (HOSPITAL_COMMUNITY): Payer: Self-pay

## 2021-03-05 ENCOUNTER — Other Ambulatory Visit (HOSPITAL_COMMUNITY): Payer: Self-pay

## 2021-03-07 ENCOUNTER — Other Ambulatory Visit (HOSPITAL_COMMUNITY): Payer: Self-pay

## 2021-03-08 ENCOUNTER — Other Ambulatory Visit (HOSPITAL_COMMUNITY): Payer: Self-pay

## 2021-03-09 ENCOUNTER — Other Ambulatory Visit (HOSPITAL_COMMUNITY): Payer: Self-pay

## 2021-03-19 ENCOUNTER — Ambulatory Visit: Payer: Self-pay

## 2021-03-19 ENCOUNTER — Other Ambulatory Visit: Payer: Self-pay

## 2021-03-19 ENCOUNTER — Other Ambulatory Visit: Payer: Self-pay | Admitting: Family Medicine

## 2021-03-19 ENCOUNTER — Other Ambulatory Visit (HOSPITAL_COMMUNITY): Payer: Self-pay

## 2021-03-19 DIAGNOSIS — M25512 Pain in left shoulder: Secondary | ICD-10-CM

## 2021-03-19 MED FILL — Olmesartan Medoxomil-Hydrochlorothiazide Tab 20-12.5 MG: ORAL | 90 days supply | Qty: 90 | Fill #1 | Status: AC

## 2021-03-27 ENCOUNTER — Other Ambulatory Visit (HOSPITAL_COMMUNITY): Payer: Self-pay

## 2021-03-27 MED ORDER — SYNTHROID 137 MCG PO TABS
137.0000 ug | ORAL_TABLET | Freq: Every morning | ORAL | 0 refills | Status: DC
  Filled 2021-03-27 – 2021-04-09 (×2): qty 30, 30d supply, fill #0

## 2021-03-27 MED ORDER — FLUOXETINE HCL 40 MG PO CAPS
40.0000 mg | ORAL_CAPSULE | Freq: Every day | ORAL | 0 refills | Status: DC
  Filled 2021-03-27 – 2021-04-09 (×2): qty 30, 30d supply, fill #0

## 2021-04-04 ENCOUNTER — Other Ambulatory Visit (HOSPITAL_COMMUNITY): Payer: Self-pay

## 2021-04-06 ENCOUNTER — Other Ambulatory Visit (HOSPITAL_COMMUNITY): Payer: Self-pay

## 2021-04-09 ENCOUNTER — Other Ambulatory Visit (HOSPITAL_COMMUNITY): Payer: Self-pay

## 2021-04-17 DIAGNOSIS — E559 Vitamin D deficiency, unspecified: Secondary | ICD-10-CM | POA: Diagnosis not present

## 2021-04-17 DIAGNOSIS — E039 Hypothyroidism, unspecified: Secondary | ICD-10-CM | POA: Diagnosis not present

## 2021-04-17 DIAGNOSIS — I10 Essential (primary) hypertension: Secondary | ICD-10-CM | POA: Diagnosis not present

## 2021-04-17 DIAGNOSIS — R251 Tremor, unspecified: Secondary | ICD-10-CM | POA: Diagnosis not present

## 2021-04-17 DIAGNOSIS — D649 Anemia, unspecified: Secondary | ICD-10-CM | POA: Diagnosis not present

## 2021-04-17 DIAGNOSIS — R5383 Other fatigue: Secondary | ICD-10-CM | POA: Diagnosis not present

## 2021-04-17 DIAGNOSIS — E785 Hyperlipidemia, unspecified: Secondary | ICD-10-CM | POA: Diagnosis not present

## 2021-04-25 ENCOUNTER — Other Ambulatory Visit (HOSPITAL_COMMUNITY): Payer: Self-pay

## 2021-04-25 DIAGNOSIS — E038 Other specified hypothyroidism: Secondary | ICD-10-CM | POA: Diagnosis not present

## 2021-04-25 DIAGNOSIS — F331 Major depressive disorder, recurrent, moderate: Secondary | ICD-10-CM | POA: Diagnosis not present

## 2021-04-25 DIAGNOSIS — F411 Generalized anxiety disorder: Secondary | ICD-10-CM | POA: Diagnosis not present

## 2021-04-25 DIAGNOSIS — E538 Deficiency of other specified B group vitamins: Secondary | ICD-10-CM | POA: Diagnosis not present

## 2021-04-25 DIAGNOSIS — E559 Vitamin D deficiency, unspecified: Secondary | ICD-10-CM | POA: Diagnosis not present

## 2021-04-25 DIAGNOSIS — I10 Essential (primary) hypertension: Secondary | ICD-10-CM | POA: Diagnosis not present

## 2021-04-25 MED ORDER — FLUOXETINE HCL 40 MG PO CAPS
40.0000 mg | ORAL_CAPSULE | Freq: Every day | ORAL | 0 refills | Status: DC
  Filled 2021-04-25: qty 90, 90d supply, fill #0

## 2021-04-25 MED ORDER — OLMESARTAN MEDOXOMIL-HCTZ 20-12.5 MG PO TABS
1.0000 | ORAL_TABLET | Freq: Every morning | ORAL | 1 refills | Status: AC
  Filled 2021-04-25 – 2021-06-26 (×2): qty 90, 90d supply, fill #0

## 2021-04-25 MED ORDER — CYANOCOBALAMIN 1000 MCG/ML IJ SOLN
INTRAMUSCULAR | 0 refills | Status: AC
  Filled 2021-04-25: qty 6, 84d supply, fill #0

## 2021-04-25 MED ORDER — "BD LUER-LOK SYRINGE 23G X 1"" 3 ML MISC"
3 refills | Status: AC
  Filled 2021-04-25: qty 6, 84d supply, fill #0

## 2021-04-25 MED ORDER — SYNTHROID 150 MCG PO TABS
150.0000 ug | ORAL_TABLET | Freq: Every day | ORAL | 0 refills | Status: DC
  Filled 2021-04-25: qty 90, 90d supply, fill #0

## 2021-04-25 MED ORDER — VITAMIN D (ERGOCALCIFEROL) 1.25 MG (50000 UNIT) PO CAPS
ORAL_CAPSULE | ORAL | 0 refills | Status: AC
  Filled 2021-04-25: qty 24, 84d supply, fill #0

## 2021-04-25 MED ORDER — PROPRANOLOL HCL 10 MG PO TABS
10.0000 mg | ORAL_TABLET | Freq: Three times a day (TID) | ORAL | 2 refills | Status: AC
  Filled 2021-04-25: qty 90, 30d supply, fill #0
  Filled 2021-06-26: qty 90, 30d supply, fill #1

## 2021-05-15 DIAGNOSIS — M47816 Spondylosis without myelopathy or radiculopathy, lumbar region: Secondary | ICD-10-CM | POA: Diagnosis not present

## 2021-05-22 DIAGNOSIS — M47816 Spondylosis without myelopathy or radiculopathy, lumbar region: Secondary | ICD-10-CM | POA: Diagnosis not present

## 2021-06-14 ENCOUNTER — Encounter: Payer: Self-pay | Admitting: Nurse Practitioner

## 2021-06-26 ENCOUNTER — Other Ambulatory Visit (HOSPITAL_COMMUNITY): Payer: Self-pay

## 2021-06-26 MED ORDER — SYNTHROID 150 MCG PO TABS
150.0000 ug | ORAL_TABLET | Freq: Every day | ORAL | 0 refills | Status: AC
  Filled 2021-06-26 – 2021-06-27 (×2): qty 30, 30d supply, fill #0

## 2021-06-27 ENCOUNTER — Encounter: Payer: Self-pay | Admitting: Nurse Practitioner

## 2021-06-27 ENCOUNTER — Ambulatory Visit: Payer: 59 | Admitting: Nurse Practitioner

## 2021-06-27 ENCOUNTER — Other Ambulatory Visit (HOSPITAL_COMMUNITY): Payer: Self-pay

## 2021-06-27 ENCOUNTER — Other Ambulatory Visit: Payer: 59

## 2021-06-27 VITALS — BP 120/82 | HR 60 | Ht 65.0 in | Wt 227.1 lb

## 2021-06-27 DIAGNOSIS — Z1211 Encounter for screening for malignant neoplasm of colon: Secondary | ICD-10-CM | POA: Diagnosis not present

## 2021-06-27 DIAGNOSIS — K529 Noninfective gastroenteritis and colitis, unspecified: Secondary | ICD-10-CM

## 2021-06-27 MED ORDER — NA SULFATE-K SULFATE-MG SULF 17.5-3.13-1.6 GM/177ML PO SOLN
1.0000 | ORAL | 0 refills | Status: DC
  Filled 2021-06-27: qty 354, 1d supply, fill #0

## 2021-06-27 NOTE — Patient Instructions (Signed)
We have sent the following medications to your pharmacy for you to pick up at your convenience: ?[place]  ? ?You have been scheduled for a colonoscopy. Please follow written instructions given to you at your visit today.  ?Please pick up your prep supplies at the pharmacy within the next 1-3 days. ?If you use inhalers (even only as needed), please bring them with you on the day of your procedure. ? ?Your provider has requested that you go to the basement level for lab work before leaving today. Press "B" on the elevator. The lab is located at the first door on the left as you exit the elevator. ? ? ?Thank you for choosing me and [facility]. ? ?Tye Savoy NP  ? ? ? ?

## 2021-06-27 NOTE — Progress Notes (Signed)
? ? ? ?ASSESSMENT AND PLAN   ? ?# 61 yo female with a one year history of crampy diarrhea, often postprandial and worse with certain foods but occurs randomly as well. Review of records show she had a colonoscopy in 2005 for evaluation of chronic diarrhea and random colon biopsies were negative for microscopic colitis at that time. Could have since developed microscopic colitis. Other possible considerations include IBS. ,  celiac, less likely IBD. Additionally Benicar can cause a sprue-like enteropathy leading to diarrhea  ?--tTg, IgA ?--needs screening colonoscopy. Consider repeat random colon biopsies to evaluate for microscopic. The risks and benefits of colonoscopy with possible polypectomy / biopsies were discussed and the patient agrees to proceed.  ?--If above negative the she can talk with prescriber provider about trying an alternative to Benicar to see if that makes any difference.  ? ? ?HISTORY OF PRESENT ILLNESS   ? ? ?Chief Complaint : chronic diarrhea ? ?Tasha Pearson is a 61 y.o. female with a past medical history significant for hiatal hernia, sleeve gastrectomy in 2016 followed by gastric bypass in 6195 ( had complications from sleeve), GERD, Vitamin D deficiency, hypothyroidism , remote cholecystectomy, hysterectomy. See PMH below for any additional history.  ? ?Tasha Pearson is referred by PCP for colon cancer screening . 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 Her last colonoscopy with random biopsies for diarrhea and polypectomy were done at an outside facility was in 2005, no colon cancer screening since . Tasha Pearson gives a one year history of diarrhea. Diarrhea occurs after eating but at random times as well and lately she has been having nocturnal diarrhea. The diarrhea is usually preceded by lower abdominal cramping.  Rice, pasta and meat exacerbate her  symptoms.  Avoids milk as it causes increased gas. She hasn't noticed any correlation with gluten. She has been on Benicar  for two years.  ? ? ?PREVIOUS GI EVALUATIONS:  ? ?Colonoscopy 2005 for diarrhea ?--Two 3 mm polyps ?--Sigmoid diverticula ?--Hemorrhoids ?Polyps were hyperplastic. Random biopsies negative for microscopic colitis ? ?March 2016 EGD ?--changes c/w gastric sleeve ?--5 cm HH ? ?Past Medical History:  ?Diagnosis Date  ? Arthritis   ? feet  ? Chest pain   ? a. remote h/o nl stress test.  ? Depression   ? GERD (gastroesophageal reflux disease)   ? H/O Graves' disease   ? Headache(784.0)   ? rare  ? Hiatal hernia   ? Hypercholesteremia   ? Hypothyroidism   ? IBS (irritable bowel syndrome)   ? Pt reported on 01/18/13  ? Morbid obesity ([place])   ? Palpitations   ? Pneumonia 20's  ? hx of  ? PONV (postoperative nausea and vomiting)   ? severe vomiting  ? Schwannoma   ? left ear  ? Symptomatic PVCs   ? a. 02/2018 Zio: Avg HR 74. 2 short episodes of SVT - longest 4 beats @ 158. Rare PACs and PVCs (<1% burden). Some triggered events correlated w/ isolated PVCs; b. 02/2018 Echo: EF 55-60%, no rwma.  ? ? ? ?Past Surgical History:  ?Procedure Laterality Date  ? ABDOMINAL HYSTERECTOMY    ? 1 ovary left  ? BIOPSY THYROID    ? CESAREAN SECTION    ? x2  ? CHOLECYSTECTOMY    ? GASTRIC ROUX-EN-Y N/A 12/11/2015  ? Procedure: LAPAROSCOPIC ROUX-EN-Y GASTRIC BYPASS HIATEL HERNIA REPAIR WITH UPPER ENDOSCOPY;  Surgeon: Excell Seltzer, MD;  Location: [facility];  Service: General;  Laterality: N/A;  ? LAPAROSCOPIC GASTRIC SLEEVE RESECTION N/A 04/26/2013  ? Procedure: LAPAROSCOPIC GASTRIC SLEEVE RESECTION;  Surgeon: Edward Jolly, MD;  Location: [facility];  Service: General;  Laterality: N/A;  ? SPINE SURGERY    ? fusion, nerve release, and replaced a disc in lower back, l5-s1  ? UPPER GI ENDOSCOPY N/A 04/26/2013  ? Procedure: UPPER GI ENDOSCOPY;  Surgeon: Edward Jolly, MD;  Location: [facility];  Service: General;  Laterality: N/A;  ? ?Family History  ?Problem Relation Age of Onset  ? Pancreatic cancer Mother   ? Heart disease Father   ?  Diabetes Brother   ? Hypertension Daughter   ? Hypertension Son   ? ?Social History  ? ?Tobacco Use  ? Smoking status: Former  ?  Years: 10.00  ?  Types: Cigarettes  ?  Quit date: 04/29/2008  ?  Years since quitting: 13.1  ? Smokeless tobacco: Never  ?Vaping Use  ? Vaping Use: Never used  ?Substance Use Topics  ? Alcohol use: Yes  ?  Alcohol/week: 0.0 standard drinks  ?  Comment: 2-3 glasses of wine every other day   ? Drug use: No  ? ?Current Outpatient Medications  ?Medication Sig Dispense Refill  ? acetaminophen (TYLENOL) 500 MG tablet Take 500 mg by mouth every 6 (six) hours as needed for pain.    ? cyanocobalamin (,VITAMIN B-12,) 1000 MCG/ML injection Inject 1 mL (1,000 mcg total) into the muscle every 30 days. 3 mL 1  ? cyanocobalamin (,VITAMIN B-12,) 1000 MCG/ML injection Inject 1 mL (1,000 mcg total) into the muscle once a week for 4 weeks, THEN 1 mL (1,000 mcg total) every 30 days. 6 mL 0  ? ergocalciferol (VITAMIN D2) 1.25 MG (50000 UT) capsule Take 1 capsule (50,000 Units total) by mouth twice weekly 24 capsule 0  ? olmesartan-hydrochlorothiazide (BENICAR HCT) 20-12.5 MG tablet Take 1 tablet by mouth daily.    ? olmesartan-hydrochlorothiazide (BENICAR HCT) 20-12.5 MG tablet Take 1 tablet by mouth in the morning. 90 tablet 1  ? propranolol (INDERAL) 10 MG tablet Take 1 tablet (10 mg total) by mouth 3 (three) times daily. 90 tablet 2  ? propranolol (INDERAL) 20 MG tablet Take 20 mg by mouth 2 (two) times daily.    ? SYNTHROID 150 MCG tablet Take 1 tablet (150 mcg total) by mouth daily before breakfast. 30 tablet 0  ? SYRINGE-NEEDLE, DISP, 3 ML (B-D 3CC LUER-LOK SYR 23GX1") 23G X 1" 3 ML MISC Cleanse skin with sterile alcohol swab, & use to inject Vitamin B12 into the muscle as directed 6 each 3  ? Vitamin D, Ergocalciferol, (DRISDOL) 1.25 MG (50000 UNIT) CAPS capsule Take 1 capsule by mouth twice a week for 12 weeks, then start taking over the counter Vitamin D3 2000 units once daily. 24 capsule 0  ?  olmesartan-hydrochlorothiazide (BENICAR HCT) 20-12.5 MG tablet TAKE 1 TABLET BY MOUTH IN THE MORNING. 90 tablet 3  ? propranolol (INDERAL) 20 MG tablet TAKE 1 TABLET (20 MG TOTAL) BY MOUTH 2 TIMES DAILY. 180 tablet 0  ? ?No current facility-administered medications for this visit.  ? ?Allergies  ?Allergen Reactions  ? Penicillins Rash  ?  Has patient had a PCN reaction causing immediate rash, facial/tongue/throat swelling, SOB or lightheadedness with hypotension:YES ?Has patient had a PCN reaction causing severe rash involving mucus membranes or skin necrosis: NO ?Has patient had a PCN reaction that required hospitalizationNO ?Has patient had a PCN reaction occurring within the last 10  years: NO ?If all of the above answers are "NO", then may proceed with Cephalosporin use.  ? ? ? ?Review of Systems: ?Positive for anxiety, back pain, depression, fatigue, night sweats, sleeping problems.  All other systems reviewed and negative except where noted in HPI.  ? ? ?PHYSICAL EXAM :   ? ?Wt Readings from Last 3 Encounters:  ?06/27/21 227 lb 2 oz (103 kg)  ?05/26/20 204 lb (92.5 kg)  ?08/31/19 190 lb 2 oz (86.2 kg)  ? ? ?BP 120/82   Pulse 60   Ht 5\' 5"  (1.651 m)   Wt 227 lb 2 oz (103 kg)   BMI 37.80 kg/m?  ?Constitutional:  Generally well appearing female in no acute distress. ?Psychiatric: Pleasant. Normal mood and affect. Behavior is normal. ?EENT: Pupils normal.  Conjunctivae are normal. No scleral icterus. ?Neck supple.  ?Cardiovascular: Normal rate, regular rhythm. No edema ?Pulmonary/chest: Effort normal and breath sounds normal. No wheezing, rales or rhonchi. ?Abdominal: Soft, nondistended, nontender. Bowel sounds active throughout. There are no masses palpable. No hepatomegaly. ?Neurological: Alert and oriented to person place and time. ?Skin: Skin is warm and dry. No rashes noted. ? ?Tye Savoy, NP  06/27/2021, 11:47 AM ? ?Cc:  ?Referring Provider ?Lorenda Hatchet, FNP ? ? ? ? ? ? ? ?

## 2021-06-28 ENCOUNTER — Other Ambulatory Visit (HOSPITAL_COMMUNITY): Payer: Self-pay

## 2021-06-28 LAB — TISSUE TRANSGLUTAMINASE, IGA: (tTG) Ab, IgA: <1 U/mL

## 2021-06-28 LAB — IGA: Immunoglobulin A: 165 mg/dL (ref 47–310)

## 2021-07-04 ENCOUNTER — Other Ambulatory Visit (HOSPITAL_COMMUNITY): Payer: Self-pay

## 2021-07-12 ENCOUNTER — Telehealth (HOSPITAL_COMMUNITY): Payer: Self-pay | Admitting: Psychiatry

## 2021-07-12 ENCOUNTER — Encounter: Payer: Self-pay | Admitting: Gastroenterology

## 2021-07-12 DIAGNOSIS — R7309 Other abnormal glucose: Secondary | ICD-10-CM | POA: Diagnosis not present

## 2021-07-12 DIAGNOSIS — E559 Vitamin D deficiency, unspecified: Secondary | ICD-10-CM | POA: Diagnosis not present

## 2021-07-12 DIAGNOSIS — E785 Hyperlipidemia, unspecified: Secondary | ICD-10-CM | POA: Diagnosis not present

## 2021-07-12 DIAGNOSIS — E039 Hypothyroidism, unspecified: Secondary | ICD-10-CM | POA: Diagnosis not present

## 2021-07-12 DIAGNOSIS — I10 Essential (primary) hypertension: Secondary | ICD-10-CM | POA: Diagnosis not present

## 2021-07-12 DIAGNOSIS — E538 Deficiency of other specified B group vitamins: Secondary | ICD-10-CM | POA: Diagnosis not present

## 2021-07-18 ENCOUNTER — Other Ambulatory Visit: Payer: Self-pay

## 2021-07-18 ENCOUNTER — Encounter: Payer: Self-pay | Admitting: Gastroenterology

## 2021-07-18 ENCOUNTER — Ambulatory Visit (AMBULATORY_SURGERY_CENTER): Payer: 59 | Admitting: Gastroenterology

## 2021-07-18 ENCOUNTER — Other Ambulatory Visit (HOSPITAL_COMMUNITY): Payer: Self-pay

## 2021-07-18 VITALS — BP 125/79 | HR 67 | Temp 97.5°F | Resp 16 | Ht 65.0 in | Wt 227.0 lb

## 2021-07-18 DIAGNOSIS — D123 Benign neoplasm of transverse colon: Secondary | ICD-10-CM

## 2021-07-18 DIAGNOSIS — D125 Benign neoplasm of sigmoid colon: Secondary | ICD-10-CM | POA: Diagnosis not present

## 2021-07-18 DIAGNOSIS — K529 Noninfective gastroenteritis and colitis, unspecified: Secondary | ICD-10-CM

## 2021-07-18 DIAGNOSIS — K640 First degree hemorrhoids: Secondary | ICD-10-CM

## 2021-07-18 DIAGNOSIS — K5730 Diverticulosis of large intestine without perforation or abscess without bleeding: Secondary | ICD-10-CM

## 2021-07-18 DIAGNOSIS — R197 Diarrhea, unspecified: Secondary | ICD-10-CM | POA: Diagnosis not present

## 2021-07-18 MED ORDER — SODIUM CHLORIDE 0.9 % IV SOLN: 500.0000 mL | Freq: Once | INTRAVENOUS | Status: DC

## 2021-07-18 MED ORDER — DICYCLOMINE HCL 10 MG PO CAPS
10.0000 mg | ORAL_CAPSULE | Freq: Three times a day (TID) | ORAL | 12 refills | Status: AC
  Filled 2021-07-18 – 2021-08-31 (×2): qty 120, 30d supply, fill #0

## 2021-07-18 NOTE — Patient Instructions (Signed)
Handout on polyps, hemorrhoids and diverticulosis provided  ? ?Await pathology results.  ? ?Continue current medications.  ? ?YOU HAD AN ENDOSCOPIC PROCEDURE TODAY AT [facility]:   Refer to the procedure report that was given to you for any specific questions about what was found during the examination.  If the procedure report does not answer your questions, please call your gastroenterologist to clarify.  If you requested that your care partner not be given the details of your procedure findings, then the procedure report has been included in a sealed envelope for you to review at your convenience later. ? ?YOU SHOULD EXPECT: Some feelings of bloating in the abdomen. Passage of more gas than usual.  Walking can help get rid of the air that was put into your GI tract during the procedure and reduce the bloating. If you had a lower endoscopy (such as a colonoscopy or flexible sigmoidoscopy) you may notice spotting of blood in your stool or on the toilet paper. If you underwent a bowel prep for your procedure, you may not have a normal bowel movement for a few days. ? ?Please Note:  You might notice some irritation and congestion in your nose or some drainage.  This is from the oxygen used during your procedure.  There is no need for concern and it should clear up in a day or so. ? ?SYMPTOMS TO REPORT IMMEDIATELY: ? ?Following lower endoscopy (colonoscopy or flexible sigmoidoscopy): ? Excessive amounts of blood in the stool ? Significant tenderness or worsening of abdominal pains ? Swelling of the abdomen that is new, acute ? Fever of 100?F or higher ? ?For urgent or emergent issues, a gastroenterologist can be reached at any hour by calling [phone]. ?Do not use MyChart messaging for urgent concerns.  ? ? ?DIET:  We do recommend a small meal at first, but then you may proceed to your regular diet.  Drink plenty of fluids but you should avoid alcoholic beverages for 24 hours. ? ?ACTIVITY:  You  should plan to take it easy for the rest of today and you should NOT DRIVE or use heavy machinery until tomorrow (because of the sedation medicines used during the test).   ? ?FOLLOW UP: ?Our staff will call the number listed on your records 48-72 hours following your procedure to check on you and address any questions or concerns that you may have regarding the information given to you following your procedure. If we do not reach you, we will leave a message.  We will attempt to reach you two times.  During this call, we will ask if you have developed any symptoms of COVID 19. If you develop any symptoms (ie: fever, flu-like symptoms, shortness of breath, cough etc.) before then, please call [phone].  If you test positive for Covid 19 in the 2 weeks post procedure, please call and report this information to [place].   ? ?If any biopsies were taken you will be contacted by phone or by letter within the next 1-3 weeks.  Please call us at [phone] if you have not heard about the biopsies in 3 weeks.  ? ? ?SIGNATURES/CONFIDENTIALITY: ?You and/or your care partner have signed paperwork which will be entered into your electronic medical record.  These signatures attest to the fact that that the information above on your After Visit Summary has been reviewed and is understood.  Full responsibility of the confidentiality of this discharge information lies with you and/or your care-partner. ? ? ?

## 2021-07-18 NOTE — Progress Notes (Signed)
Pt's states no medical or surgical changes since previsit or office visit. 

## 2021-07-18 NOTE — Progress Notes (Signed)
Called to room to assist during endoscopic procedure.  Patient ID and intended procedure confirmed with present staff. Received instructions for my participation in the procedure from the performing physician.  

## 2021-07-18 NOTE — Progress Notes (Signed)
VSS, transported to PACU °

## 2021-07-18 NOTE — Op Note (Addendum)
[facility] ?Patient Name: Tasha Pearson ?Procedure Date: 07/18/2021 2:31 PM ?MRN: [id number] ?Endoscopist: [name] , MD ?Age: 61 ?Referring MD:  ?Date of Birth: 1960-11-24 ?Gender: Female ?Account #: [id number] ?Procedure:                Colonoscopy ?Indications:              Chronic diarrhea ?Medicines:                Monitored Anesthesia Care ?Procedure:                Pre-Anesthesia Assessment: ?                          - Prior to the procedure, a History and Physical  ?                          was performed, and patient medications and  ?                          allergies were reviewed. The patient's tolerance of  ?                          previous anesthesia was also reviewed. The risks  ?                          and benefits of the procedure and the sedation  ?                          options and risks were discussed with the patient.  ?                          All questions were answered, and informed consent  ?                          was obtained. Prior Anticoagulants: The patient has  ?                          taken no previous anticoagulant or antiplatelet  ?                          agents. ASA Grade Assessment: III - A patient with  ?                          severe systemic disease. After reviewing the risks  ?                          and benefits, the patient was deemed in  ?                          satisfactory condition to undergo the procedure. ?                          After obtaining informed consent, the colonoscope  ?  was passed under direct vision. Throughout the  ?                          procedure, the patient's blood pressure, pulse, and  ?                          oxygen saturations were monitored continuously. The  ?                          CF [id number] #[id number] was introduced through the anus  ?                          and advanced to the the cecum, identified by  ?                          appendiceal orifice and ileocecal valve. The   ?                          ileocecal valve, appendiceal orifice, and rectum  ?                          were photographed. The quality of the bowel  ?                          preparation was good. The colonoscopy was performed  ?                          without difficulty. The patient tolerated the  ?                          procedure well. ?Scope In: 2:36:00 PM ?Scope Out: 2:54:35 PM ?Scope Withdrawal Time: 0 hours 15 minutes 36 seconds  ?Total Procedure Duration: 0 hours 18 minutes 35 seconds  ?Findings:                 The perianal and digital rectal examinations were  ?                          normal. ?                          Five sessile polyps were found in the sigmoid colon  ?                          and transverse colon. The polyps were 5 to 8 mm in  ?                          size. These polyps were removed with a cold snare.  ?                          Resection and retrieval were complete. ?                          Multiple small-mouthed diverticula were found in  ?  the left colon. There was no evidence of  ?                          diverticular bleeding. ?                          Internal hemorrhoids were found during  ?                          retroflexion. The hemorrhoids were small and Grade  ?                          I (internal hemorrhoids that do not prolapse). ?                          The exam was otherwise without abnormality on  ?                          direct and retroflexion views. Random biopsies  ?                          obtained throughout the colon. ?Complications:            No immediate complications. Estimated blood loss:  ?                          None. ?Estimated Blood Loss:     Estimated blood loss: none. ?Impression:               - Five 5 to 8 mm polyps in the sigmoid colon and in  ?                          the transverse colon, removed with a cold snare.  ?                          Resected and retrieved. ?                          -  Mild diverticulosis in the left colon. ?                          - Internal hemorrhoids. ?                          - The examination was otherwise normal on direct  ?                          and retroflexion views. ?Recommendation:           - Repeat colonoscopy after studies are complete for  ?                          surveillance based on pathology results. ?                          - Patient has a contact number available for  ?  emergencies. The signs and symptoms of potential  ?                          delayed complications were discussed with the  ?                          patient. Return to normal activities tomorrow.  ?                          Written discharge instructions were provided to the  ?                          patient. ?                          - Resume previous diet. ?                          - Continue present medications. ?                          - Dicyclomine 10 mg po tid ac, 1 year of refills. ?                          - Await pathology results. ?[name], MD ?07/18/2021 3:04:11 PM ?This report has been signed electronically. ?

## 2021-07-18 NOTE — Progress Notes (Signed)
See 06/27/2021 H&P, no changes.  ?

## 2021-07-18 NOTE — Progress Notes (Signed)
D.T. vital signs. °

## 2021-07-20 ENCOUNTER — Telehealth: Payer: Self-pay

## 2021-07-20 ENCOUNTER — Telehealth: Payer: Self-pay | Admitting: *Deleted

## 2021-07-20 NOTE — Telephone Encounter (Signed)
Left message on f/u call 

## 2021-07-20 NOTE — Telephone Encounter (Signed)
Attempted 2nd call back. No answer, left VM. ?

## 2021-07-30 ENCOUNTER — Encounter: Payer: Self-pay | Admitting: Gastroenterology

## 2021-08-02 ENCOUNTER — Other Ambulatory Visit (HOSPITAL_COMMUNITY): Payer: Self-pay

## 2021-08-02 MED ORDER — SYNTHROID 150 MCG PO TABS
150.0000 ug | ORAL_TABLET | Freq: Every day | ORAL | 0 refills | Status: AC
  Filled 2021-08-02: qty 30, 30d supply, fill #0

## 2021-08-03 ENCOUNTER — Other Ambulatory Visit (HOSPITAL_COMMUNITY): Payer: Self-pay

## 2021-08-03 MED ORDER — OLMESARTAN MEDOXOMIL-HCTZ 20-12.5 MG PO TABS: 1.0000 | ORAL_TABLET | Freq: Every morning | ORAL | 0 refills | Status: AC

## 2021-08-03 MED ORDER — PROPRANOLOL HCL 10 MG PO TABS
10.0000 mg | ORAL_TABLET | Freq: Three times a day (TID) | ORAL | 0 refills | Status: AC
  Filled 2021-08-03: qty 90, 30d supply, fill #0

## 2021-08-03 MED ORDER — SYNTHROID 150 MCG PO TABS: 150.0000 ug | ORAL_TABLET | Freq: Every day | ORAL | 0 refills | Status: AC

## 2021-08-14 ENCOUNTER — Other Ambulatory Visit (HOSPITAL_COMMUNITY): Payer: Self-pay

## 2021-08-14 MED ORDER — OLMESARTAN MEDOXOMIL-HCTZ 20-12.5 MG PO TABS
1.0000 | ORAL_TABLET | Freq: Every morning | ORAL | 0 refills | Status: AC
  Filled 2021-08-14: qty 90, 90d supply, fill #0

## 2021-08-14 MED ORDER — SYNTHROID 150 MCG PO TABS
150.0000 ug | ORAL_TABLET | Freq: Every day | ORAL | 0 refills | Status: AC
  Filled 2021-08-14 – 2021-08-22 (×2): qty 90, 90d supply, fill #0

## 2021-08-14 MED ORDER — PROPRANOLOL HCL 10 MG PO TABS
10.0000 mg | ORAL_TABLET | Freq: Three times a day (TID) | ORAL | 0 refills | Status: AC
  Filled 2021-08-14: qty 270, 90d supply, fill #0

## 2021-08-22 ENCOUNTER — Other Ambulatory Visit (HOSPITAL_COMMUNITY): Payer: Self-pay

## 2021-08-27 ENCOUNTER — Other Ambulatory Visit (HOSPITAL_COMMUNITY): Payer: Self-pay

## 2021-08-27 MED ORDER — FLUOXETINE HCL 40 MG PO CAPS
40.0000 mg | ORAL_CAPSULE | Freq: Every day | ORAL | 0 refills | Status: AC
  Filled 2021-08-27: qty 30, 30d supply, fill #0

## 2021-08-29 ENCOUNTER — Other Ambulatory Visit (HOSPITAL_COMMUNITY): Payer: Self-pay

## 2021-08-31 ENCOUNTER — Other Ambulatory Visit (HOSPITAL_COMMUNITY): Payer: Self-pay

## 2023-03-05 IMAGING — DX DG SHOULDER 2+V*L*
3 series · 3 of 3 positions shown · non-contrast
Comparison: Chest radiographs [DATE].

CLINICAL DATA: 59-year-old female status post fall on [REDACTED]
at work. Persistent pain.

EXAM:
LEFT SHOULDER - 2+ VIEW

[shoulder grashey]
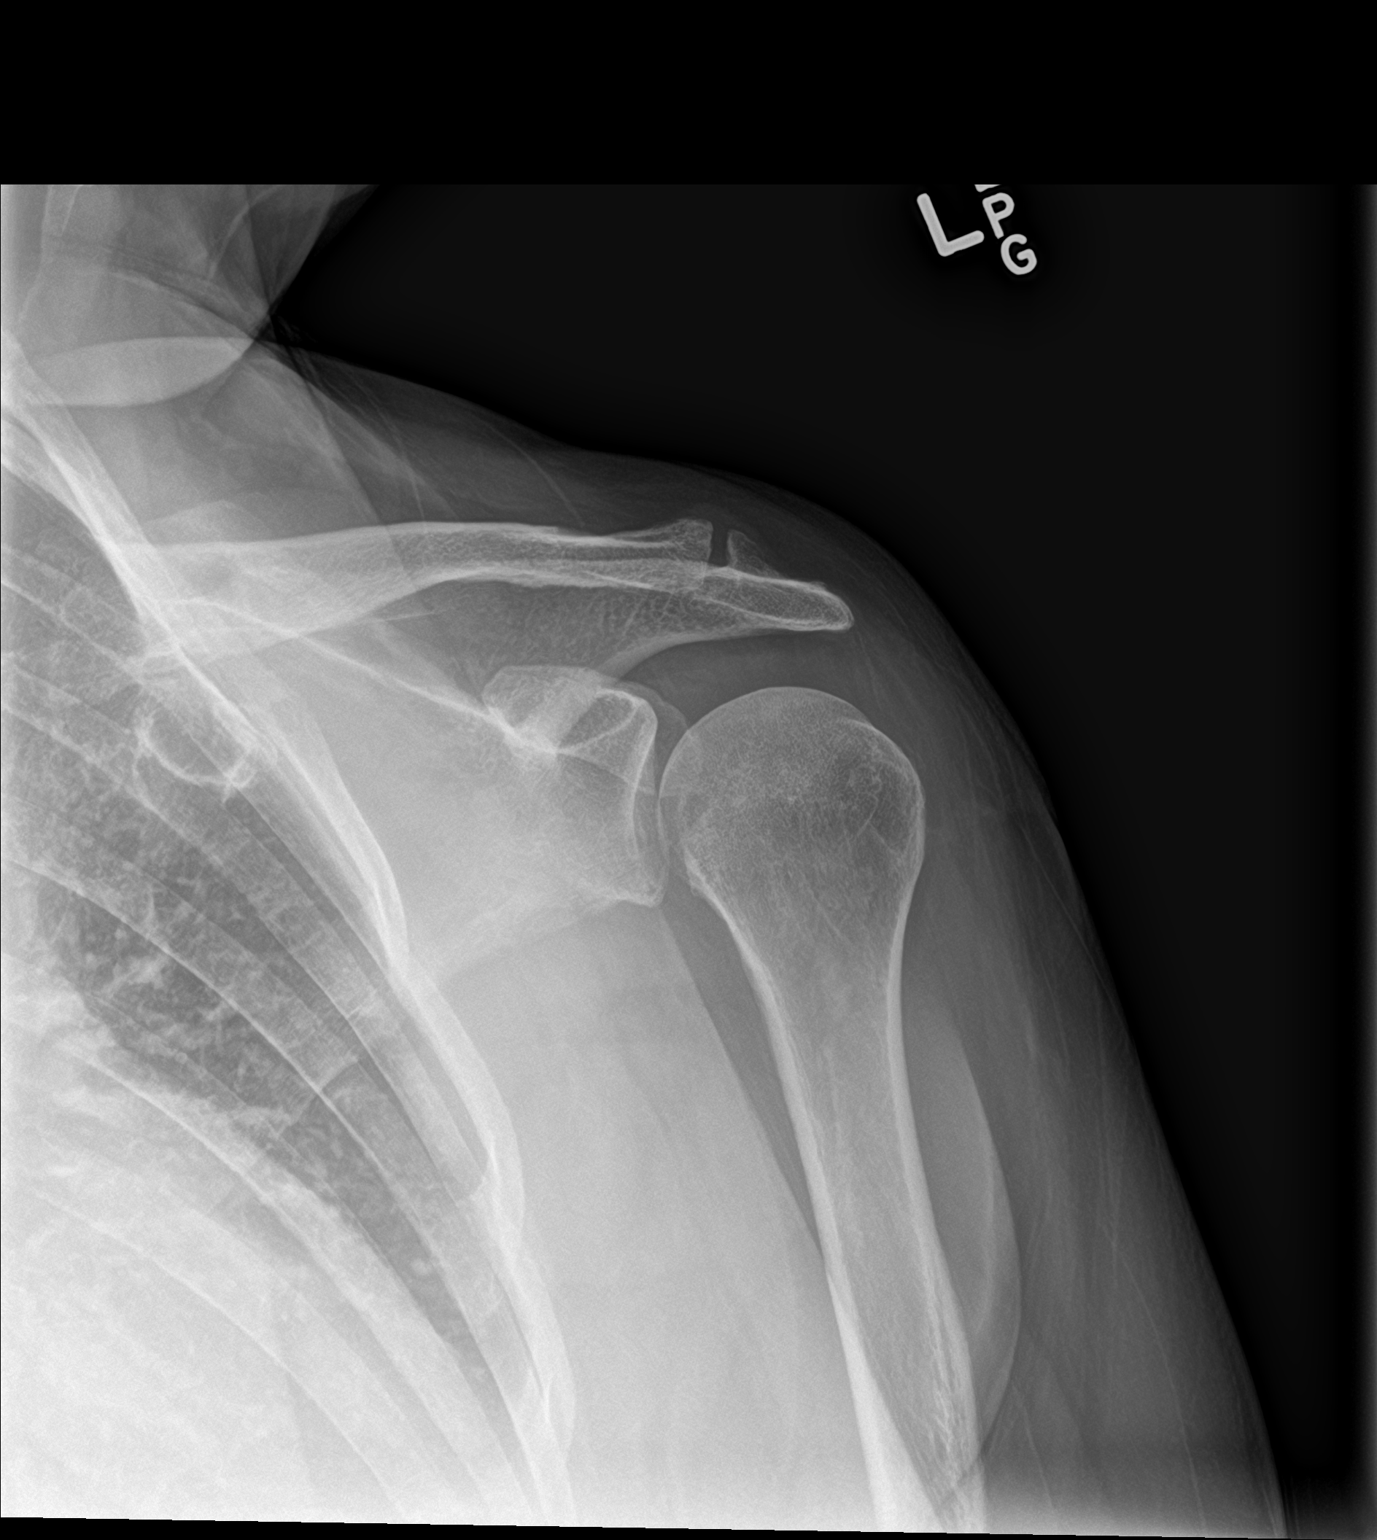

[shoulder y-view]
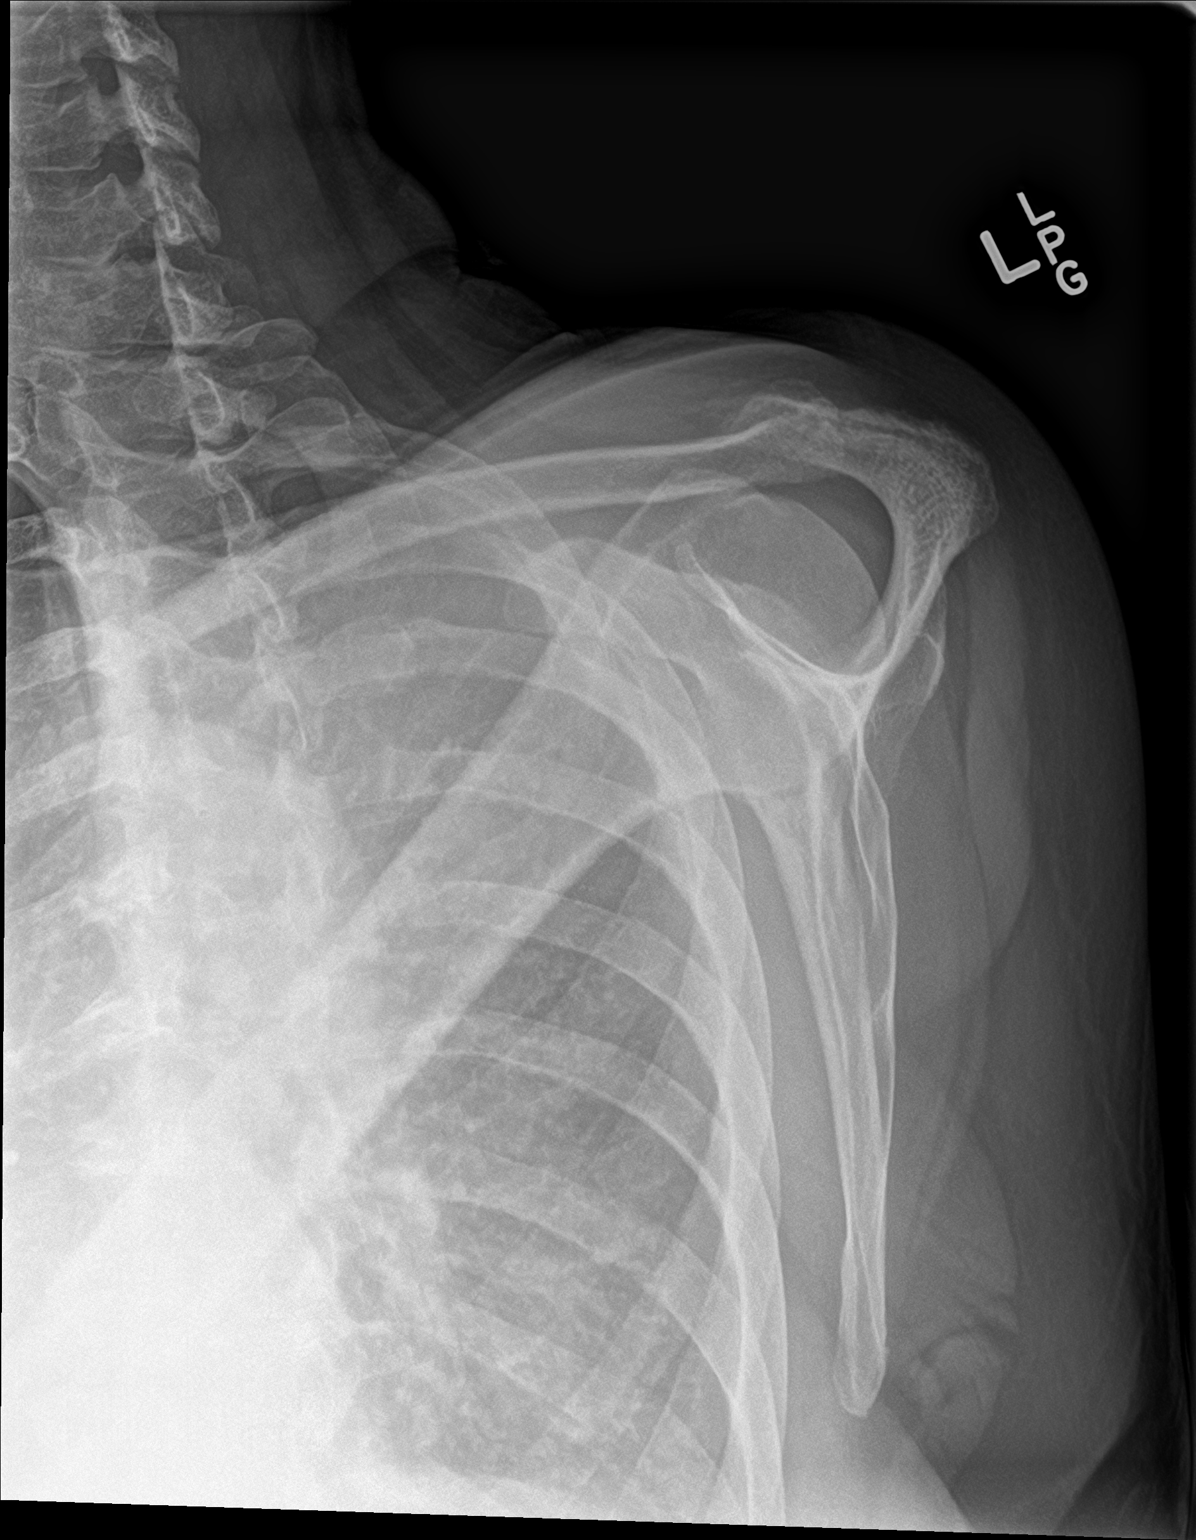

[shoulder axial]
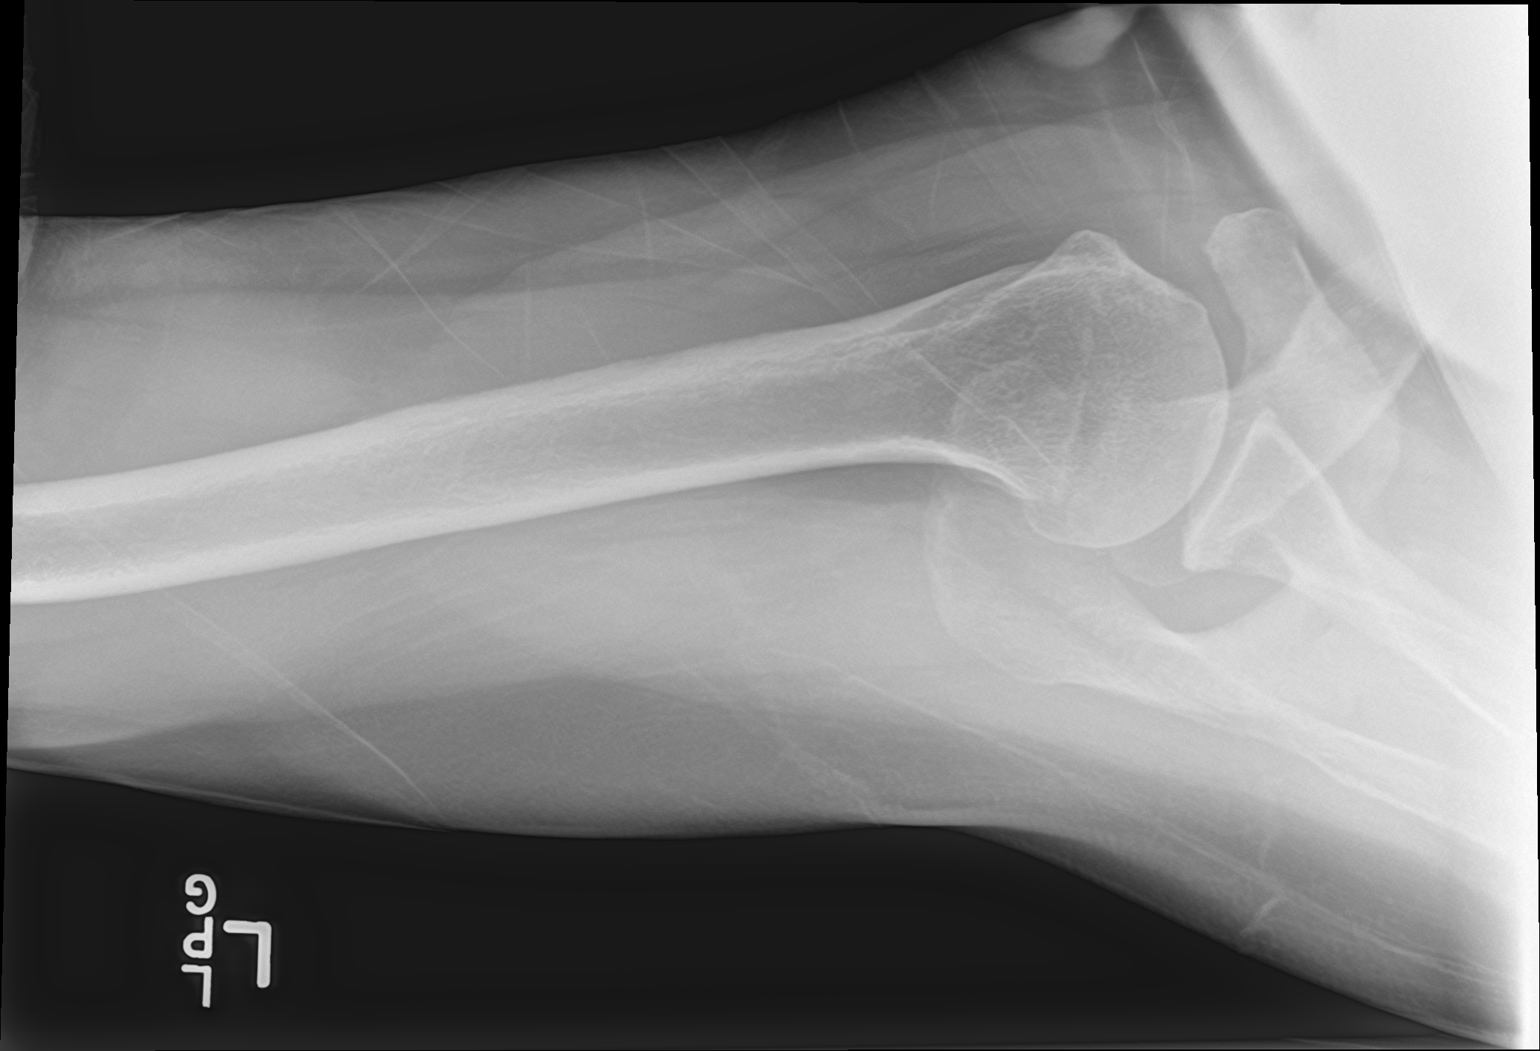

[3 of 3 positions shown; findings below may reference images not displayed]

FINDINGS: Bone mineralization is within normal limits. There is no evidence of
fracture or dislocation. Mild degenerative spurring at the left AC
joint does not appear significantly changed from 9250. Negative
visible left ribs and chest.
IMPRESSION: No acute fracture or dislocation identified about the left shoulder.
